# Patient Record
Sex: Male | Born: 1955 | Race: White | Hispanic: No | Marital: Married | Smoking: Former smoker
Health system: Southern US, Community
[De-identification: ages and names within clinical notes are randomized; demographics above are authoritative.]

## PROBLEM LIST (undated history)

## (undated) DIAGNOSIS — K08409 Partial loss of teeth, unspecified cause, unspecified class: Secondary | ICD-10-CM

## (undated) DIAGNOSIS — K219 Gastro-esophageal reflux disease without esophagitis: Secondary | ICD-10-CM

## (undated) DIAGNOSIS — I509 Heart failure, unspecified: Secondary | ICD-10-CM

## (undated) DIAGNOSIS — E785 Hyperlipidemia, unspecified: Secondary | ICD-10-CM

## (undated) DIAGNOSIS — F329 Major depressive disorder, single episode, unspecified: Secondary | ICD-10-CM

## (undated) DIAGNOSIS — M25731 Osteophyte, right wrist: Secondary | ICD-10-CM

## (undated) DIAGNOSIS — E119 Type 2 diabetes mellitus without complications: Secondary | ICD-10-CM

## (undated) DIAGNOSIS — M1990 Unspecified osteoarthritis, unspecified site: Secondary | ICD-10-CM

## (undated) DIAGNOSIS — F32A Depression, unspecified: Secondary | ICD-10-CM

## (undated) DIAGNOSIS — I10 Essential (primary) hypertension: Secondary | ICD-10-CM

## (undated) HISTORY — DX: Partial loss of teeth, unspecified cause, unspecified class: K08.409

## (undated) HISTORY — DX: Major depressive disorder, single episode, unspecified: F32.9

## (undated) HISTORY — DX: Depression, unspecified: F32.A

## (undated) HISTORY — DX: Hyperlipidemia, unspecified: E78.5

## (undated) HISTORY — DX: Type 2 diabetes mellitus without complications: E11.9

## (undated) HISTORY — DX: Essential (primary) hypertension: I10

## (undated) HISTORY — PX: APPENDECTOMY: SHX54

## (undated) HISTORY — DX: Gastro-esophageal reflux disease without esophagitis: K21.9

## (undated) HISTORY — DX: Heart failure, unspecified: I50.9

## (undated) HISTORY — PX: CORONARY ANGIOPLASTY WITH STENT PLACEMENT: SHX49

## (undated) HISTORY — DX: Osteophyte, right wrist: M25.731

---

## 2001-10-26 ENCOUNTER — Encounter: Payer: Self-pay | Admitting: *Deleted

## 2001-10-26 ENCOUNTER — Inpatient Hospital Stay (HOSPITAL_COMMUNITY): Admission: EM | Admit: 2001-10-26 | Discharge: 2001-10-28 | Payer: Self-pay | Admitting: *Deleted

## 2001-10-27 ENCOUNTER — Encounter: Payer: Self-pay | Admitting: *Deleted

## 2004-05-05 ENCOUNTER — Ambulatory Visit: Payer: Self-pay | Admitting: Anesthesiology

## 2004-05-28 ENCOUNTER — Ambulatory Visit: Payer: Self-pay | Admitting: Anesthesiology

## 2004-06-25 ENCOUNTER — Ambulatory Visit: Payer: Self-pay | Admitting: Anesthesiology

## 2004-07-31 ENCOUNTER — Ambulatory Visit: Payer: Self-pay | Admitting: Anesthesiology

## 2004-09-04 ENCOUNTER — Ambulatory Visit: Payer: Self-pay | Admitting: Anesthesiology

## 2004-10-01 ENCOUNTER — Ambulatory Visit: Payer: Self-pay | Admitting: Anesthesiology

## 2004-10-29 ENCOUNTER — Ambulatory Visit: Payer: Self-pay | Admitting: Anesthesiology

## 2004-11-24 ENCOUNTER — Ambulatory Visit: Payer: Self-pay | Admitting: Anesthesiology

## 2004-12-25 ENCOUNTER — Ambulatory Visit: Payer: Self-pay | Admitting: Anesthesiology

## 2005-03-25 ENCOUNTER — Ambulatory Visit: Payer: Self-pay | Admitting: Anesthesiology

## 2005-04-23 ENCOUNTER — Ambulatory Visit: Payer: Self-pay | Admitting: Anesthesiology

## 2005-05-26 ENCOUNTER — Ambulatory Visit: Payer: Self-pay | Admitting: Anesthesiology

## 2005-06-24 ENCOUNTER — Ambulatory Visit: Payer: Self-pay | Admitting: Anesthesiology

## 2005-07-16 ENCOUNTER — Ambulatory Visit: Payer: Self-pay | Admitting: Anesthesiology

## 2005-08-18 ENCOUNTER — Ambulatory Visit: Payer: Self-pay | Admitting: Anesthesiology

## 2005-09-10 ENCOUNTER — Ambulatory Visit: Payer: Self-pay | Admitting: Anesthesiology

## 2005-10-08 ENCOUNTER — Ambulatory Visit: Payer: Self-pay | Admitting: Anesthesiology

## 2005-12-10 ENCOUNTER — Ambulatory Visit: Payer: Self-pay | Admitting: Anesthesiology

## 2005-12-31 ENCOUNTER — Ambulatory Visit: Payer: Self-pay | Admitting: Anesthesiology

## 2006-02-02 ENCOUNTER — Ambulatory Visit: Payer: Self-pay | Admitting: Anesthesiology

## 2006-03-11 ENCOUNTER — Ambulatory Visit: Payer: Self-pay | Admitting: Anesthesiology

## 2006-05-06 ENCOUNTER — Ambulatory Visit: Payer: Self-pay | Admitting: Anesthesiology

## 2006-06-01 ENCOUNTER — Ambulatory Visit: Payer: Self-pay | Admitting: Anesthesiology

## 2006-06-30 ENCOUNTER — Ambulatory Visit: Payer: Self-pay | Admitting: Anesthesiology

## 2006-08-26 ENCOUNTER — Ambulatory Visit: Payer: Self-pay | Admitting: Anesthesiology

## 2006-09-16 ENCOUNTER — Ambulatory Visit: Payer: Self-pay | Admitting: Anesthesiology

## 2006-10-13 ENCOUNTER — Ambulatory Visit: Payer: Self-pay | Admitting: Anesthesiology

## 2006-11-08 ENCOUNTER — Ambulatory Visit: Payer: Self-pay | Admitting: Anesthesiology

## 2006-12-06 ENCOUNTER — Ambulatory Visit: Payer: Self-pay | Admitting: Anesthesiology

## 2007-01-04 ENCOUNTER — Ambulatory Visit: Payer: Self-pay | Admitting: Anesthesiology

## 2007-02-15 ENCOUNTER — Ambulatory Visit: Payer: Self-pay | Admitting: Anesthesiology

## 2007-03-16 ENCOUNTER — Ambulatory Visit: Payer: Self-pay | Admitting: Anesthesiology

## 2007-05-13 ENCOUNTER — Ambulatory Visit: Payer: Self-pay | Admitting: Anesthesiology

## 2007-07-13 ENCOUNTER — Ambulatory Visit: Payer: Self-pay | Admitting: Anesthesiology

## 2007-09-08 ENCOUNTER — Ambulatory Visit: Payer: Self-pay | Admitting: Anesthesiology

## 2007-11-02 ENCOUNTER — Ambulatory Visit: Payer: Self-pay | Admitting: Anesthesiology

## 2007-11-17 ENCOUNTER — Ambulatory Visit: Payer: Self-pay | Admitting: Anesthesiology

## 2007-12-07 ENCOUNTER — Ambulatory Visit: Payer: Self-pay | Admitting: Anesthesiology

## 2008-02-02 ENCOUNTER — Ambulatory Visit: Payer: Self-pay | Admitting: Anesthesiology

## 2008-04-03 ENCOUNTER — Ambulatory Visit: Payer: Self-pay | Admitting: Anesthesiology

## 2008-05-01 ENCOUNTER — Ambulatory Visit: Payer: Self-pay | Admitting: Anesthesiology

## 2008-06-29 ENCOUNTER — Ambulatory Visit: Payer: Self-pay | Admitting: Anesthesiology

## 2008-07-27 HISTORY — PX: CORONARY ANGIOPLASTY: SHX604

## 2008-08-21 ENCOUNTER — Encounter: Payer: Self-pay | Admitting: Endocrinology

## 2008-08-21 ENCOUNTER — Ambulatory Visit: Payer: Self-pay | Admitting: Anesthesiology

## 2008-10-05 ENCOUNTER — Ambulatory Visit: Payer: Self-pay | Admitting: Cardiology

## 2008-10-22 ENCOUNTER — Ambulatory Visit: Payer: Self-pay | Admitting: Anesthesiology

## 2008-11-30 ENCOUNTER — Ambulatory Visit: Payer: Self-pay | Admitting: Anesthesiology

## 2009-01-24 ENCOUNTER — Ambulatory Visit: Payer: Self-pay | Admitting: Anesthesiology

## 2009-03-28 ENCOUNTER — Ambulatory Visit: Payer: Self-pay | Admitting: Anesthesiology

## 2009-06-19 ENCOUNTER — Ambulatory Visit: Payer: Self-pay | Admitting: Anesthesiology

## 2009-07-29 ENCOUNTER — Ambulatory Visit: Payer: Self-pay | Admitting: Anesthesiology

## 2009-09-30 ENCOUNTER — Ambulatory Visit: Payer: Self-pay | Admitting: Anesthesiology

## 2009-12-11 ENCOUNTER — Ambulatory Visit: Payer: Self-pay | Admitting: Anesthesiology

## 2010-03-04 ENCOUNTER — Encounter: Payer: Self-pay | Admitting: Internal Medicine

## 2010-03-04 ENCOUNTER — Encounter: Payer: Self-pay | Admitting: Endocrinology

## 2010-03-04 LAB — CONVERTED CEMR LAB
ALT: 31 units/L
AST: 23 units/L
Albumin: 4.7 g/dL
Alkaline Phosphatase: 64 units/L
BUN: 13 mg/dL
CO2: 30.1 meq/L
Calcium: 9.5 mg/dL
Chloride: 99 meq/L
Creatinine, Ser: 1 mg/dL
GFR calc Af Amer: >60 mL/min
GFR calc non Af Amer: >60 mL/min
Glucose, Bld: 212 mg/dL
HCT: 46.9 %
Hemoglobin: 16.2 g/dL
Hgb A1c MFr Bld: 9 %
Lymphocytes, automated: 0.9 %
MCV: 88.2 fL
Neutrophils Relative %: 4.6 %
PSA: 0.26 ng/mL
Platelets: 197 10*3/uL
Potassium: 4.4 meq/L
RBC: 5.32 M/uL
RDW: 13.4 %
Sodium: 137 meq/L
Total Bilirubin: 0.7 mg/dL
Total Protein: 6.6 g/dL
WBC: 6 10*3/uL

## 2010-03-05 ENCOUNTER — Ambulatory Visit: Payer: Self-pay | Admitting: Anesthesiology

## 2010-03-13 ENCOUNTER — Encounter: Payer: Self-pay | Admitting: Internal Medicine

## 2010-03-26 ENCOUNTER — Ambulatory Visit: Payer: Self-pay | Admitting: Endocrinology

## 2010-03-26 DIAGNOSIS — I2510 Atherosclerotic heart disease of native coronary artery without angina pectoris: Secondary | ICD-10-CM | POA: Insufficient documentation

## 2010-03-26 DIAGNOSIS — F329 Major depressive disorder, single episode, unspecified: Secondary | ICD-10-CM

## 2010-03-26 DIAGNOSIS — F3289 Other specified depressive episodes: Secondary | ICD-10-CM | POA: Insufficient documentation

## 2010-03-26 DIAGNOSIS — E119 Type 2 diabetes mellitus without complications: Secondary | ICD-10-CM | POA: Insufficient documentation

## 2010-03-26 DIAGNOSIS — M109 Gout, unspecified: Secondary | ICD-10-CM | POA: Insufficient documentation

## 2010-04-23 ENCOUNTER — Ambulatory Visit: Payer: Self-pay | Admitting: Endocrinology

## 2010-04-23 LAB — CONVERTED CEMR LAB: Fructosamine: 323 micromoles/L — ABNORMAL HIGH (ref <285)

## 2010-05-12 ENCOUNTER — Ambulatory Visit: Payer: Self-pay | Admitting: Anesthesiology

## 2010-06-18 ENCOUNTER — Telehealth: Payer: Self-pay | Admitting: Endocrinology

## 2010-07-08 ENCOUNTER — Ambulatory Visit: Payer: Self-pay | Admitting: Anesthesiology

## 2010-08-26 NOTE — Progress Notes (Signed)
Summary: onglyza  Phone Note Call from Patient Call back at [facility] Phone [phone]   Caller: Patient Call For: Dr Everardo All Summary of Call: Pt given samples Onglyza 5mg , pt now out of samples and requesting prescription.[facility]. Initial call taken by: Verdell Face,  June 18, 2010 9:12 AM  Follow-up for Phone Call        Rx sent to pharmacy. Pt informed. Follow-up by: Brenton Grills CMA Duncan Dull),  June 18, 2010 9:38 AM    Prescriptions: ONGLYZA 5 MG TABS (SAXAGLIPTIN HCL) 1 tab each am  #30 x 3   Entered by:   Brenton Grills CMA (AAMA)   Authorized by:   Minus Breeding MD   Signed by:   Brenton Grills CMA (AAMA) on 06/18/2010   Method used:   [facility]        [facility]  [place] [facility]retail)       [address]       [place], [place]  [address]       Ph: [phone] or [phone]       Fax: [phone]   RxID:   [id number]

## 2010-08-26 NOTE — Assessment & Plan Note (Signed)
Summary: NEW ENDO CON/BCBS/DM/NWS  #   Vital Signs:  Patient profile:   55 year old male Height:      70 inches (177.80 cm) Weight:      224 pounds (101.82 kg) BMI:     32.26 O2 Sat:      96 % on Room air Temp:     97.1 degrees F (36.17 degrees C) oral Pulse rate:   58 / minute BP sitting:   122 / 88  (left arm) Cuff size:   large  Vitals Entered By: Brenton Grills MA (March 26, 2010 11:08 AM)  O2 Flow:  Room air CC: New Endo/DM/aj Is Patient Diabetic? Yes   Referring Keyonda Bickle:  Jerl Mina MD Primary Makar Slatter:  Jerl Mina MD  CC:  New Endo/DM/aj.  History of Present Illness: pt states 6 years h/o dm.  it is complicated by cad.  he has never been on insulin.  he takes glucovance, and seldom checks cbg's.  he wants to achieve goal a1c without insulin if at all possible pt says his diet and exercise are "not good."   symptomatically, pt states few mos of slight bruising of the forearms, and associated excessive diaphoresis.   Current Medications (verified): 1)  Atenolol 50 Mg Tabs (Atenolol) .Marland Kitchen.. 1 Tablet By Mouth Two Times A Day 2)  Diltiazem Hcl Coated Beads 240 Mg Xr24h-Cap (Diltiazem Hcl Coated Beads) .Marland Kitchen.. 1 By Mouth Once Daily 3)  Lisinopril 20 Mg Tabs (Lisinopril) .Marland Kitchen.. 1 Tablet By Mouth Once Daily 4)  Aspirin 81 Mg Tbec (Aspirin) .Marland Kitchen.. 1 By Mouth Once Daily 5)  Crestor 10 Mg Tabs (Rosuvastatin Calcium) .Marland Kitchen.. 1 Tablet By Mouth Once Daily 6)  Effient 10 Mg Tabs (Prasugrel Hcl) .Marland Kitchen.. 1 By Mouth Once Daily 7)  Glyburide-Metformin 2.5-500 Mg Tabs (Glyburide-Metformin) .... 2 Tablets By Mouth Two Times A Day 8)  Omeprazole Magnesium 20.6 (20 Base) Mg Cpdr (Omeprazole Magnesium) .... As Needed 9)  Glucosamine-Chondroitin  Caps (Glucosamine-Chondroit-Vit C-Mn) .Marland Kitchen.. 1 By Mouth Once Daily  Allergies (verified): No Known Drug Allergies  Past History:  Past Medical History: CAD (ICD-414.00) GOUT (ICD-274.9) DEPRESSION (ICD-311) DM (ICD-250.00) FAMILY HISTORY DIABETES  1ST DEGREE RELATIVE (ICD-V18.0) FAMILY HISTORY OF ALCOHOLISM/ADDICTION (ICD-V61.41)  Past Surgical History: Appendectomy (1999)  Family History: Reviewed history and no changes required. Family History of Alcoholism/Addiction Family History of Arthritis Family History Diabetes (mother, 2 sibs) Family History High cholesterol Family History Hypertension Family History of Heart Disease (Parents) Family History of Stroke  Social History: Reviewed history and no changes required. Occupation: Acupuncturist married Former Smoker Alcohol use-no Occupation:  employed Smoking Status:  quit  Review of Systems       The patient complains of weight loss.         denies blurry vision, headache, chest pain, sob, n/v, cramps, memory loss, hypoglycemia, and rhinorrhea.  he reports urinary frequency.  depression is improved with medication.    Physical Exam  General:  obese.   Head:  head: no deformity eyes: no periorbital swelling, no proptosis external nose and ears are normal mouth: no lesion seen Neck:  Supple without thyroid enlargement or tenderness.  Lungs:  Clear to auscultation bilaterally. Normal respiratory effort.  Heart:  Regular rate and rhythm without murmurs or gallops noted. Normal S1,S2.   Abdomen:  abdomen is soft, nontender.  no hepatosplenomegaly.   not distended.  no hernia  Msk:  muscle bulk and strength are grossly normal.  no obvious joint swelling.  gait is normal  and steady  Pulses:  dorsalis pedis intact bilat.  no carotid bruit  Extremities:  no deformity.  no ulcer on the feet.  feet are of normal color and temp.  trace right pedal edema and trace left pedal edema.   Neurologic:  cn 2-12 grossly intact.   readily moves all 4's.   sensation is intact to touch on the feet  Skin:  normal texture and temp.  no rash.  not diaphoretic  Cervical Nodes:  No significant adenopathy.  Psych:  Alert and cooperative; normal mood and affect; normal  attention span and concentration.   Additional Exam:  outside test results are reviewed:  a1c (03/04/09)=9.0   Impression & Recommendations:  Problem # 1:  DM (ICD-250.00) needs increased rx  Problem # 2:  edema mild due to actos and/or atenolol  Problem # 3:  CAD (ICD-414.00) this increases the risk from any potential hypoglycemia.  Medications Added to Medication List This Visit: 1)  Atenolol 50 Mg Tabs (Atenolol) .Marland Kitchen.. 1 tablet by mouth two times a day 2)  Diltiazem Hcl Coated Beads 240 Mg Xr24h-cap (Diltiazem hcl coated beads) .Marland Kitchen.. 1 by mouth once daily 3)  Lisinopril 20 Mg Tabs (Lisinopril) .Marland Kitchen.. 1 tablet by mouth once daily 4)  Aspirin 81 Mg Tbec (Aspirin) .Marland Kitchen.. 1 by mouth once daily 5)  Crestor 10 Mg Tabs (Rosuvastatin calcium) .Marland Kitchen.. 1 tablet by mouth once daily 6)  Effient 10 Mg Tabs (Prasugrel hcl) .Marland Kitchen.. 1 by mouth once daily 7)  Glyburide-metformin 2.5-500 Mg Tabs (Glyburide-metformin) .... 2 tablets by mouth two times a day 8)  Omeprazole Magnesium 20.6 (20 Base) Mg Cpdr (Omeprazole magnesium) .... As needed 9)  Glucosamine-chondroitin Caps (Glucosamine-chondroit-vit c-mn) .Marland Kitchen.. 1 by mouth once daily 10)  Actos 45 Mg Tabs (Pioglitazone hcl) .Marland Kitchen.. 1 tab once daily 11)  Onglyza 5 Mg Tabs (Saxagliptin hcl) .Marland Kitchen.. 1 tab each am 12)  Onetouch Ultra Test Strp (Glucose blood) .... Once daily, and lencets 250.00  Other Orders: Consultation Level IV ([address])  Patient Instructions: 1)  good diet and exercise habits significanly improve the control of your diabetes.  please let me know if you wish to be referred to a dietician, or weight-loss surgeon.  high blood sugar is very risky to your health.  you should see an eye doctor every year. 2)  controlling your blood pressure and cholesterol drastically reduces the damage diabetes does to your body.  this also applies to quitting smoking.  please discuss these with your doctor.  you should take an aspirin every day, unless you have been  advised by a doctor not to. 3)  we will need to take this complex situation in stages 4)  check your blood sugar 1 time a day.  vary the time of day when you check, between before the 3 meals, and at bedtime.  also check if you have symptoms of your blood sugar being too high or too low.  please keep a record of the readings and bring it to your next appointment here.  please call us sooner if you are having low blood sugar episodes.  5)  for now, increase actos to 45 mg once daily, and add onglyza 5 mg each am.   6)  Please schedule a follow-up appointment in 4-6 weeks. 7)  if blood sugar is persistently over 150, call sooner, so we can add another pill. Prescriptions: ONETOUCH ULTRA TEST  STRP (GLUCOSE BLOOD) once daily, and lencets 250.00  #100 x 3  Entered and Authorized by:   Minus Breeding MD   Signed by:   Minus Breeding MD on 03/26/2010   Method used:   [facility]        [id number]       [address]       [place], [place]  [address]       Ph: [phone] or [phone]       Fax: [phone]   RxID:   [id number]    Preventive Care Screening  Colonoscopy:    Date:  07/28/2007    Results:  normal   Last Tetanus Booster:    Date:  07/27/2006    Results:  Historical

## 2010-08-26 NOTE — Assessment & Plan Note (Signed)
Summary: 4 - 6 WK FU---STC   Vital Signs:  Patient profile:   55 year old male Height:      70 inches (177.80 cm) Weight:      222.25 pounds (101.02 kg) BMI:     32.00 O2 Sat:      96 % on Room air Temp:     97.2 degrees F (36.22 degrees C) oral Pulse rate:   62 / minute BP sitting:   122 / 80  (left arm) Cuff size:   large  Vitals Entered By: Brenton Grills MA (April 23, 2010 11:11 AM)  O2 Flow:  Room air CC: 4-6 week F/U/refill on Lisinopril/aj Is Patient Diabetic? Yes   Referring Provider:  Jerl Mina MD Primary Provider:  Jerl Mina MD  CC:  4-6 week F/U/refill on Lisinopril/aj.  History of Present Illness: pt states he feels well in general.  he says he has not been checking cbg's.  no sxs of hypoglycemia.    Current Medications (verified): 1)  Atenolol 50 Mg Tabs (Atenolol) .Marland Kitchen.. 1 Tablet By Mouth Two Times A Day 2)  Diltiazem Hcl Coated Beads 240 Mg Xr24h-Cap (Diltiazem Hcl Coated Beads) .Marland Kitchen.. 1 By Mouth Once Daily 3)  Lisinopril 20 Mg Tabs (Lisinopril) .Marland Kitchen.. 1 Tablet By Mouth Once Daily 4)  Aspirin 81 Mg Tbec (Aspirin) .Marland Kitchen.. 1 By Mouth Once Daily 5)  Crestor 10 Mg Tabs (Rosuvastatin Calcium) .Marland Kitchen.. 1 Tablet By Mouth Once Daily 6)  Effient 10 Mg Tabs (Prasugrel Hcl) .Marland Kitchen.. 1 By Mouth Once Daily 7)  Glyburide-Metformin 2.5-500 Mg Tabs (Glyburide-Metformin) .... 2 Tablets By Mouth Two Times A Day 8)  Omeprazole Magnesium 20.6 (20 Base) Mg Cpdr (Omeprazole Magnesium) .... As Needed 9)  Glucosamine-Chondroitin  Caps (Glucosamine-Chondroit-Vit C-Mn) .Marland Kitchen.. 1 By Mouth Once Daily 10)  Actos 45 Mg Tabs (Pioglitazone Hcl) .Marland Kitchen.. 1 Tab Once Daily 11)  Onglyza 5 Mg Tabs (Saxagliptin Hcl) .Marland Kitchen.. 1 Tab Each Am 12)  Onetouch Ultra Test  Strp (Glucose Blood) .... Once Daily, and Lencets 250.00  Allergies (verified): No Known Drug Allergies  Past History:  Past Medical History: Last updated: 03/26/2010 CAD (ICD-414.00) GOUT (ICD-274.9) DEPRESSION (ICD-311) DM  (ICD-250.00) FAMILY HISTORY DIABETES 1ST DEGREE RELATIVE (ICD-V18.0) FAMILY HISTORY OF ALCOHOLISM/ADDICTION (ICD-V61.41)  Review of Systems  The patient denies weight loss and weight gain.    Physical Exam  General:  normal appearance.   Extremities:  no edema Additional Exam:  Fructosamine         [H]  323 umol/L    (converts to a1c of 7.1)   Impression & Recommendations:  Problem # 1:  DM (ICD-250.00) Assessment Improved  Other Orders: T-Fructosamine ([address]34-74259) Est. Patient Level III (56387)  Patient Instructions: 1)  check your blood sugar 1 time a day.  vary the time of day when you check, between before the 3 meals, and at bedtime.  also check if you have symptoms of your blood sugar being too high or too low.  please keep a record of the readings and bring it to your next appointment here.  please call us sooner if you are having low blood sugar episodes.  2)  Please schedule a follow-up appointment in 4 months. 3)  blood tests are being ordered for you today.  please call [phone] to hear your test results. 4)  if today's blood test is in a good range, please continue same medication.  if it is high, options are: 5)  1.  add bromocriptine (generic pill) 6)  2.  add welchol (brand-name cholesterol pill) 7)  3.  change onglyza to victoza (a daily injection) 8)  (update: i left message on phone-tree:  same meds for now.

## 2010-08-26 NOTE — Letter (Signed)
Summary: [facility]   Imported By: Sherian Rein 04/01/2010 13:28:37  _____________________________________________________________________  External Attachment:    Type:   Image     Comment:   External Document

## 2010-09-05 ENCOUNTER — Ambulatory Visit: Payer: Self-pay | Admitting: Anesthesiology

## 2010-10-29 ENCOUNTER — Ambulatory Visit: Payer: Self-pay | Admitting: Anesthesiology

## 2010-11-17 ENCOUNTER — Ambulatory Visit: Payer: Self-pay | Admitting: Anesthesiology

## 2010-11-19 ENCOUNTER — Other Ambulatory Visit: Payer: Self-pay | Admitting: Endocrinology

## 2010-12-12 NOTE — Cardiovascular Report (Signed)
[place]. [facility]  Patient:    Peter Lamb, Peter Lamb Visit Number: [id number] MRN: [id number]          Service Type: MED Location: [id number] Attending Physician:  Netta Cedars Dictated by:   Delrae Rend, M.D. Proc. Date: 10/27/01 Admit Date:  10/26/2001 Discharge Date: 10/28/2001   CC:         Dr. Omar Person, [place], [facility]  Dr. Sharol Roussel, Cardiology, [place], [facility] [address] [place]   Cardiac Catheterization  PROCEDURE: 1. Selective left ventriculography. 2. Selective left and right coronary arteriography. 3. Cutting balloon angioplasty of the obtuse marginal branch of the    circumflex coronary artery.  CARDIOLOGIST:  Delrae Rend, M.D.  INDICATION:  Mr. Peter Lamb is a 55 year old gentleman with history of hypertension, diabetes, hypercholesterolemia, previous coronary artery PTCA and stenting x 3 in 1996.  He presents to the emergency room with chest pain suggestive of unstable angina.  Of note, he has also been having symptoms very typical for chronic stable angina pectoris.  Due to his multiple cardiac risk factors and symptoms which are very typical, he was brought to the cardiac catheterization lab to evaluate his coronary anatomy.  HEMODYNAMIC DATA:  Left ventricle pressures were 113/6 with an end-diastolic pressure of 14 mmHg. The central aortic pressures were 113/87 with a mean of 101 mmHg.  There was no pressure gradient across the aortic valve.  ANGIOGRAPHIC DATA:  Left ventricle: The left ventricular systolic function is well preserved and is estimated at 60%.  There is no significant mitral regurgitation.  CORONARY ANGIOGRAPHIC DATA:  Right coronary artery: The right coronary artery is a moderate caliber vessel and is nondominant.  It has mild disease.  Left main coronary artery: The left main coronary artery is a large caliber vessel.   It bifurcates into a dominant circumflex and a large left anterior descending artery.  Circumflex coronary artery: The circumflex coronary artery is a dominant vessel and gives origin to several PDA branches distally.   It gives origin to a moderate to large size obtuse marginal #1.  The obtuse marginal #1 has a secondary branch which is almost equivalent to an optional obtuse marginal. The secondary branch has proximal 85% stenosis.  The stent in the mid to distal part of the circumflex coronary artery and also the stent in the obtuse marginal branch of the circumflex coronary artery which is distal to the secondary branch is widely patent.  Left anterior descending artery: The left anterior descending artery is a large caliber vessel.  There appears to be a stent in the proximal and mid segment, and there is a moderate size diagonal #1 which comes off of the stented segment.  The stent is widely patent.  There is mild disease in the left anterior descending artery.  INTERVENTIONAL DATA:  Successful cutting balloon angioplasty with a 2.5 x 6 mm cutting balloon.  Multiple cuts were made in the second branch of the obtuse marginal #1.   The stenosis was reduced from 85% to 0%.  There was TIMI-3 flow noted with no evidence of dissection and no evidence of thrombus at the end of the procedure.  The patient tolerated the procedure well.  TECHNIQUE OF PROCEDURE:  Under the the usual sterile precautions using 6-French right femoral access, a 6-French multipurpose B2 catheter was advanced into the ascending aorta over the 0.035 mm J wire.  The catheter was gently advanced into the left  ventricle and left ventricle pressures were monitored.  Hand contrast injection of left ventricle was performed both in the RAO and LAO projections.  The catheter was flushed with saline, pulled back into the ascending aorta, and pressure gradient across the aortic valve was monitored.  The right coronary  artery was selectively engaged, and arteriography was performed.   Then the catheter was pulled out of the body and exchanged for a 6-French diagnostic Judkins left catheter.  Left main coronary artery was selectively engaged, and arteriography was performed.  TECHNIQUE OF CORONARY INTERVENTION:  The 6-French right femoral access sheath was exchanged for a 8-French sheath.  The 8-French 4 guide was advanced in the ascending aorta over a 0.035 mm J wire.  The left main coronary artery was selectively engaged.  Then a 190 cm  x 0.014 inch Luge wire was advanced into the circumflex coronary artery, and the wire was carefully positioned in the distal segment of the secondary branch of the obtuse marginal #1.  After confirming the wire position with angiogram, a 2.5 x 6 mm cutting balloon was advanced into the obtuse marginal artery.  After confirming the position of the cutting balloon, three cutting balloon angioplasty performed at maximum of 7 atmospheres pressure for up to 2 minutes.  Then the balloon was deflated, pulled back in the guiding catheter, and arteriography was performed. Intracoronary nitroglycerin was also administered.  Excellent results were noted.  Then the guidewire and balloon were pulled out of the body, and arteriography was performed.  During the procedure, intravenous heparin was administered, and ACT was maintained at therapeutic range at 301 seconds at the end of the procedure. The patient tolerated the procedure well.  The arterial access sheath was sutured in place, and the patient was transferred to the floor in stable condition. Dictated by:   Delrae Rend, M.D. Attending Physician:  Netta Cedars DD:  10/27/01 TD:  10/28/01 Job: [id number]

## 2010-12-12 NOTE — Discharge Summary (Signed)
[place]. [facility]  Patient:    Peter Lamb, Peter Lamb Visit Number: [id number] MRN: [id number]          Service Type: MED Location: [id number] Attending Physician:  Netta Cedars Dictated by:   Abelino Derrick, P.A.C. Admit Date:  10/26/2001 Discharge Date: 10/28/2001   CC:         Dr. Ilda Basset, M.D.  Dr. Darrold Junker   Discharge Summary  DISCHARGE DIAGNOSES: 1. Unstable angina, obtuse marginal branch cutting balloon this admission. 2. Prior circumflex and left anterior descending stenting, patent at    catheterization this admission. 3. Hypertension. 4. Treated hyperlipidemia. 5. Non-insulin-dependent diabetes.  HISTORY OF PRESENT ILLNESS:  The patient is a 55 year old male who had stent in 1996 at [facility].  He has hypertension, diabetes, and hyperlipidemia.  He was admitted on October 26, 2001, with chest pain consistent with unstable angina.  CK-MBs and troponins were negative.  HOSPITAL COURSE:  He was admitted to telemetry, started on heparin, and set up for catheterization.  This was done on October 27, 2001, by Delrae Rend, M.D. This revealed a patent RCA, patent prior circumflex site, patent OM site, and patent LAD site.  He did have a branch narrowing of 85% that was dilated with a cutting balloon.  The plan is for medical therapy and Plavix for four weeks. No Integrilin was used.  We feel that he can be discharged on October 28, 2001.  DISCHARGE MEDICATIONS:  The patient is to hold his GlucoVance for two days and then resume at 2.5/500.  He is to go home on enalapril 20 mg a day, Lipitor 10 mg a day, Tiazac 240 mg q.d., atenolol 50 mg a day, Prilosec 20 mg a day, nitroglycerin sublingual p.r.n., Flexeril 10 mg a day, and Plavix once a day for four weeks.  LABORATORY DATA:  White count 6.3, hemoglobin 16.8, hematocrit 46.9, platelets 203.  CK-MB and troponin were negative.  The INR was 1.0. Sodium 136,  potassium 4.3, BUN 18, creatinine 1.3.  A lipid profile was obtained and the results are pending at the time of dictation.  The chest x-ray showed no acute disease.  DISPOSITION:  The patient is discharged in stable condition.  FOLLOW-UP:  Will follow up with Dr. Darrold Junker and Dr. Ronna Polio. Dictated by:   Abelino Derrick, P.A.C. Attending Physician:  Netta Cedars DD:  10/28/01 TD:  10/29/01 Job: [id number]

## 2010-12-15 ENCOUNTER — Ambulatory Visit: Payer: BC Managed Care – PPO

## 2010-12-31 ENCOUNTER — Ambulatory Visit: Payer: Self-pay | Admitting: Anesthesiology

## 2011-03-03 ENCOUNTER — Ambulatory Visit: Payer: Self-pay | Admitting: Anesthesiology

## 2011-04-06 ENCOUNTER — Ambulatory Visit: Payer: Self-pay | Admitting: Anesthesiology

## 2011-05-13 DIAGNOSIS — I10 Essential (primary) hypertension: Secondary | ICD-10-CM | POA: Insufficient documentation

## 2011-05-13 DIAGNOSIS — E119 Type 2 diabetes mellitus without complications: Secondary | ICD-10-CM | POA: Insufficient documentation

## 2011-05-13 DIAGNOSIS — E785 Hyperlipidemia, unspecified: Secondary | ICD-10-CM | POA: Insufficient documentation

## 2011-05-13 DIAGNOSIS — I2510 Atherosclerotic heart disease of native coronary artery without angina pectoris: Secondary | ICD-10-CM | POA: Insufficient documentation

## 2011-05-16 ENCOUNTER — Other Ambulatory Visit: Payer: Self-pay | Admitting: Endocrinology

## 2011-06-03 ENCOUNTER — Ambulatory Visit: Payer: Self-pay | Admitting: Anesthesiology

## 2011-07-10 ENCOUNTER — Ambulatory Visit: Payer: Self-pay | Admitting: Anesthesiology

## 2011-07-30 ENCOUNTER — Ambulatory Visit: Payer: Self-pay | Admitting: Anesthesiology

## 2011-10-01 ENCOUNTER — Ambulatory Visit: Payer: Self-pay | Admitting: Anesthesiology

## 2011-10-12 DIAGNOSIS — Z0271 Encounter for disability determination: Secondary | ICD-10-CM

## 2011-11-19 ENCOUNTER — Ambulatory Visit: Payer: Self-pay | Admitting: Anesthesiology

## 2012-01-20 ENCOUNTER — Ambulatory Visit: Payer: Self-pay | Admitting: Anesthesiology

## 2012-03-21 ENCOUNTER — Ambulatory Visit: Payer: Self-pay | Admitting: Anesthesiology

## 2012-06-21 ENCOUNTER — Ambulatory Visit: Payer: Self-pay | Admitting: Anesthesiology

## 2012-08-18 ENCOUNTER — Ambulatory Visit: Payer: Self-pay | Admitting: Anesthesiology

## 2012-08-31 ENCOUNTER — Ambulatory Visit: Payer: Self-pay | Admitting: Anesthesiology

## 2012-09-22 ENCOUNTER — Ambulatory Visit: Payer: Self-pay | Admitting: Anesthesiology

## 2012-10-11 ENCOUNTER — Ambulatory Visit: Payer: Self-pay | Admitting: Anesthesiology

## 2012-10-18 ENCOUNTER — Ambulatory Visit: Payer: Self-pay | Admitting: Anesthesiology

## 2012-11-07 ENCOUNTER — Ambulatory Visit: Payer: Self-pay | Admitting: Anesthesiology

## 2013-01-03 ENCOUNTER — Ambulatory Visit: Payer: Self-pay | Admitting: Anesthesiology

## 2013-04-03 ENCOUNTER — Ambulatory Visit: Payer: Self-pay | Admitting: Anesthesiology

## 2013-05-31 ENCOUNTER — Ambulatory Visit: Payer: Self-pay | Admitting: Anesthesiology

## 2013-08-31 ENCOUNTER — Ambulatory Visit: Payer: Self-pay | Admitting: Anesthesiology

## 2013-10-26 ENCOUNTER — Ambulatory Visit: Payer: Self-pay | Admitting: Anesthesiology

## 2013-12-27 ENCOUNTER — Ambulatory Visit: Payer: Self-pay | Admitting: Anesthesiology

## 2014-01-30 ENCOUNTER — Ambulatory Visit: Payer: Self-pay | Admitting: Anesthesiology

## 2014-06-19 ENCOUNTER — Ambulatory Visit: Payer: Self-pay | Admitting: Anesthesiology

## 2014-09-19 ENCOUNTER — Ambulatory Visit: Payer: Self-pay | Admitting: Anesthesiology

## 2014-11-21 ENCOUNTER — Ambulatory Visit
Admit: 2014-11-21 | Disposition: A | Discharge status: Home / Self Care | Payer: Self-pay | Attending: Anesthesiology | Admitting: Anesthesiology

## 2015-02-07 ENCOUNTER — Telehealth: Payer: Self-pay | Admitting: Anesthesiology

## 2015-02-07 ENCOUNTER — Telehealth: Payer: Self-pay | Admitting: *Deleted

## 2015-02-07 NOTE — Telephone Encounter (Signed)
Patient is out of flexeril. Instructed that Dr. Andree Elk is out of office until 02-11-15. I will try to reach him by cell phone for permission to refill.

## 2015-02-07 NOTE — Telephone Encounter (Signed)
Has MR apt on 7/20 , pt stats he is already out of meds and would like to get his muscle relaxer called in

## 2015-02-13 ENCOUNTER — Ambulatory Visit: Payer: Medicare Other | Attending: Anesthesiology | Admitting: Anesthesiology

## 2015-02-13 ENCOUNTER — Encounter: Payer: Self-pay | Admitting: Anesthesiology

## 2015-02-13 VITALS — BP 135/74 | HR 62 | Temp 97.5°F | Resp 16 | Ht 70.0 in | Wt 240.0 lb

## 2015-02-13 DIAGNOSIS — M5136 Other intervertebral disc degeneration, lumbar region: Secondary | ICD-10-CM

## 2015-02-13 DIAGNOSIS — M5416 Radiculopathy, lumbar region: Secondary | ICD-10-CM | POA: Diagnosis not present

## 2015-02-13 DIAGNOSIS — M47816 Spondylosis without myelopathy or radiculopathy, lumbar region: Secondary | ICD-10-CM

## 2015-02-13 DIAGNOSIS — M1288 Other specific arthropathies, not elsewhere classified, other specified site: Secondary | ICD-10-CM | POA: Insufficient documentation

## 2015-02-13 DIAGNOSIS — M545 Low back pain: Secondary | ICD-10-CM | POA: Diagnosis present

## 2015-02-13 DIAGNOSIS — F1190 Opioid use, unspecified, uncomplicated: Secondary | ICD-10-CM | POA: Diagnosis not present

## 2015-02-13 MED ORDER — LIDOCAINE HCL (PF) 1 % IJ SOLN
INTRAMUSCULAR | Status: AC
  Administered 2015-02-13: 15:00:00
  Filled 2015-02-13: qty 5

## 2015-02-13 MED ORDER — SODIUM CHLORIDE 0.9 % IJ SOLN
INTRAMUSCULAR | Status: AC
  Administered 2015-02-13: 15:00:00
  Filled 2015-02-13: qty 10

## 2015-02-13 MED ORDER — OXYCODONE HCL ER 60 MG PO T12A: 1.0000 | EXTENDED_RELEASE_TABLET | Freq: Three times a day (TID) | ORAL | Status: DC

## 2015-02-13 MED ORDER — MIDAZOLAM HCL 5 MG/5ML IJ SOLN
INTRAMUSCULAR | Status: AC
  Administered 2015-02-13: 2 mg via INTRAVENOUS
  Filled 2015-02-13: qty 5

## 2015-02-13 MED ORDER — TRIAMCINOLONE ACETONIDE 40 MG/ML IJ SUSP
INTRAMUSCULAR | Status: AC
  Administered 2015-02-13: 15:00:00
  Filled 2015-02-13: qty 1

## 2015-02-13 MED ORDER — IOPAMIDOL (ISOVUE-M 200) INJECTION 41%
INTRAMUSCULAR | Status: AC
  Administered 2015-02-13: 15:00:00
  Filled 2015-02-13: qty 10

## 2015-02-13 MED ORDER — ROPIVACAINE HCL 2 MG/ML IJ SOLN
INTRAMUSCULAR | Status: AC
  Filled 2015-02-13: qty 10

## 2015-02-13 NOTE — Progress Notes (Signed)
Safety precautions to be maintained throughout the outpatient stay will include: orient to surroundings, keep bed in low position, maintain call bell within reach at all times, provide assistance with transfer out of bed and ambulation.  

## 2015-02-13 NOTE — Patient Instructions (Signed)

## 2015-02-13 NOTE — Progress Notes (Signed)
Script in hand for oxycodone x 3Epidural Steroid Injection Patient Information  Description: The epidural space surrounds the nerves as they exit the spinal cord.  In some patients, the nerves can be compressed and inflamed by a bulging disc or a tight spinal canal (spinal stenosis).  By injecting steroids into the epidural space, we can bring irritated nerves into direct contact with a potentially helpful medication.  These steroids act directly on the irritated nerves and can reduce swelling and inflammation which often leads to decreased pain.  Epidural steroids may be injected anywhere along the spine and from the neck to the low back depending upon the location of your pain.   After numbing the skin with local anesthetic (like Novocaine), a small needle is passed into the epidural space slowly.  You may experience a sensation of pressure while this is being done.  The entire block usually last less than 10 minutes.  Conditions which may be treated by epidural steroids:   Low back and leg pain  Neck and arm pain  Spinal stenosis  Post-laminectomy syndrome  Herpes zoster (shingles) pain  Pain from compression fractures  Preparation for the injection:  1. Do not eat any solid food or dairy products within 6 hours of your appointment.  2. You may drink clear liquids up to 2 hours before appointment.  Clear liquids include water, black coffee, juice or soda.  No milk or cream please. 3. You may take your regular medication, including pain medications, with a sip of water before your appointment  Diabetics should hold regular insulin (if taken separately) and take 1/2 normal NPH dos the morning of the procedure.  Carry some sugar containing items with you to your appointment. 4. A driver must accompany you and be prepared to drive you home after your procedure.  5. Bring all your current medications with your. 6. An IV may be inserted and sedation may be given at the discretion of the  physician.   7. A blood pressure cuff, EKG and other monitors will often be applied during the procedure.  Some patients may need to have extra oxygen administered for a short period. 8. You will be asked to provide medical information, including your allergies, prior to the procedure.  We must know immediately if you are taking blood thinners (like Coumadin/Warfarin)  Or if you are allergic to IV iodine contrast (dye). We must know if you could possible be pregnant.  Possible side-effects:  Bleeding from needle site  Infection (rare, may require surgery)  Nerve injury (rare)  Numbness & tingling (temporary)  Difficulty urinating (rare, temporary)  Spinal headache ( a headache worse with upright posture)  Light -headedness (temporary)  Pain at injection site (several days)  Decreased blood pressure (temporary)  Weakness in arm/leg (temporary)  Pressure sensation in back/neck (temporary)  Call if you experience:  Fever/chills associated with headache or increased back/neck pain.  Headache worsened by an upright position.  New onset weakness or numbness of an extremity below the injection site  Hives or difficulty breathing (go to the emergency room)  Inflammation or drainage at the infection site  Severe back/neck pain  Any new symptoms which are concerning to you  Please note:  Although the local anesthetic injected can often make your back or neck feel good for several hours after the injection, the pain will likely return.  It takes 3-7 days for steroids to work in the epidural space.  You may not notice any pain relief for  at least that one week.  If effective, we will often do a series of three injections spaced 3-6 weeks apart to maximally decrease your pain.  After the initial series, we generally will wait several months before considering a repeat injection of the same type.  If you have any questions, please call [phone] [facility]

## 2015-02-14 NOTE — Telephone Encounter (Signed)
No problems 

## 2015-02-14 NOTE — Progress Notes (Signed)
PROCEDURE PERFORMED: L5-S1 lumbar epidural steroid injection under fluroscopic guidance with moderate sedation.  CC:  Left side lower back pain with persistent left side posterior lateral leg pain  HPI:  He presents for reevaluation. He was last seen a few months ago and continues to have pain in his low back with radiation into the left posterior lateral leg. He reports that he did well with his last injection yielding a 50-75% improvement lasting several weeks and overall his pain is better at this time. He desires to proceed with a repeat injection today. He feels he is making good progress with the injections and his medication management. Otherwise, he denies any change in lower extremity strength or function or problems with bowel bladder function.  Physical Exam:    PERRL, EOMI  Heart RRR   LCTA  Musculoskeletal: He continues to have pain in the left lumbar region with radiation running down the left posterior lateral leg. He does have a positive straight leg raise on the left side. His strength appears to be a baseline without other changes noted.  Assessment: Lumbar degenerative disc disease with left side L5 radiculitis   2. Facet arthropathy   3. chronic opiate management      PLAN:   1. We will proceed with a lumbar epidural steroids injection today. Medications: We will continue presently prescribed medications with refills given today  2.  The patient is to return for reevaluation in approximately 3 months. He has been instructed to continue follow-up with their primary care physician regarding their baseline medical care.    Procedure:  LESI:  NOTE: The risks, benefits, and expectations of the procedure have been discussed and explained to the patient who was understanding and in agreement with suggested treatment plan. No guarantees were made.  DESCRIPTION OF PROCEDURE: Lumbar epidural steroid injection with IV Versed, EKG, blood pressure, pulse, and pulse oximetry  monitoring. The procedure was performed with the patient in the prone position under fluoroscopic guidance. A local anesthetic skin wheal of 1.5% plain lidocaine was performed at the appropriate site after fluoroscopic identifictation  Using strict aseptic technique, I then advanced an 18-gauge Tuohy epidural needle in the midline via loss-of-resistance  Technique. There was negative aspiration for negative aspiration for heme or  CSF.  I then confirmed position with both AP and Lateral fluoroscan. 2 cc of Isovue were injected to confirm needle placement  A total of 5 mL of Preservative-Free normal saline with 40 mg of Kenalog and 1cc Ropicaine 0.2 percent was injected incrementally via the  epidurally placed needle. Needle removed. The patient tolerated the injection well.   @Renesmae Donahey  Andree Elk, MD@

## 2015-02-26 ENCOUNTER — Other Ambulatory Visit: Payer: Self-pay | Admitting: Anesthesiology

## 2015-04-15 ENCOUNTER — Other Ambulatory Visit: Payer: Self-pay | Admitting: Anesthesiology

## 2015-04-16 NOTE — Telephone Encounter (Signed)
Patient called at 9:00 to get refill on methacarbomal and sched appt for 05-08-15

## 2015-04-17 ENCOUNTER — Telehealth: Payer: Self-pay | Admitting: Anesthesiology

## 2015-04-17 NOTE — Telephone Encounter (Signed)
Pt needs a refill on methocarbamol

## 2015-05-08 ENCOUNTER — Ambulatory Visit: Payer: Medicare Other | Attending: Anesthesiology | Admitting: Anesthesiology

## 2015-05-08 VITALS — BP 151/84 | HR 51 | Temp 97.5°F | Resp 16 | Ht 70.0 in | Wt 234.0 lb

## 2015-05-08 DIAGNOSIS — M1288 Other specific arthropathies, not elsewhere classified, other specified site: Secondary | ICD-10-CM | POA: Diagnosis not present

## 2015-05-08 DIAGNOSIS — M545 Low back pain: Secondary | ICD-10-CM | POA: Diagnosis present

## 2015-05-08 DIAGNOSIS — F1190 Opioid use, unspecified, uncomplicated: Secondary | ICD-10-CM | POA: Insufficient documentation

## 2015-05-08 DIAGNOSIS — M5416 Radiculopathy, lumbar region: Secondary | ICD-10-CM | POA: Insufficient documentation

## 2015-05-08 DIAGNOSIS — M47816 Spondylosis without myelopathy or radiculopathy, lumbar region: Secondary | ICD-10-CM

## 2015-05-08 DIAGNOSIS — M5136 Other intervertebral disc degeneration, lumbar region: Secondary | ICD-10-CM

## 2015-05-08 DIAGNOSIS — M51369 Other intervertebral disc degeneration, lumbar region without mention of lumbar back pain or lower extremity pain: Secondary | ICD-10-CM

## 2015-05-08 MED ORDER — OXYCODONE HCL ER 60 MG PO T12A: 1.0000 | EXTENDED_RELEASE_TABLET | Freq: Three times a day (TID) | ORAL | Status: DC

## 2015-05-08 MED ORDER — METHOCARBAMOL 750 MG PO TABS: 750.0000 mg | ORAL_TABLET | Freq: Two times a day (BID) | ORAL | Status: DC

## 2015-05-08 NOTE — Progress Notes (Signed)
Safety precautions to be maintained throughout the outpatient stay will include: orient to surroundings, keep bed in low position, maintain call bell within reach at all times, provide assistance with transfer out of bed and ambulation.  

## 2015-05-08 NOTE — Patient Instructions (Signed)
Return in 2 months.  A prescription for OXYCODONE X 2  was given to you today. A prescription for ROBAXIN was sent to your pharmacy and should be available for pickup today.

## 2015-05-08 NOTE — Progress Notes (Signed)
Discharged ambulatory at 3 pm

## 2015-05-09 NOTE — Progress Notes (Signed)
  CC:  Left side lower back pain with persistent left side posterior lateral leg pain  HPI:  He presents for reevaluation last seen a few months ago. He's been doing well since his last examination and is taking his medications as prescribed. No new significant changes found in his lower extremity strength or function in his low back pain has been of the same quality and characteristic as previously documented. Otherwise his usual state of health at this time. Physical Exam:    PERRL, EOMI  Heart RRR   LCTA  Musculoskeletal: He continues to have pain in the left lumbar region with radiation running down the left posterior lateral leg. He does have a positive straight leg raise on the left side. His strength appears to be a baseline without other changes noted.  Assessment: Lumbar degenerative disc disease with left side L5 radiculitis   2. Facet arthropathy   3. chronic opiate management      PLAN:   1. We'll defer on any repeat injections at this time and have him return to clinic approximately 2 months for reevaluation and refill of his medications today for the next 2 months. We'll be refill in both feet Robaxin and his OxyContin.  2.   He has been instructed to continue follow-up with their primary care physician regarding their baseline medical care.    @James  Andree Elk, MD@

## 2015-06-03 ENCOUNTER — Other Ambulatory Visit: Payer: Self-pay | Admitting: Anesthesiology

## 2015-07-02 ENCOUNTER — Encounter: Payer: Self-pay | Admitting: Anesthesiology

## 2015-07-02 ENCOUNTER — Ambulatory Visit: Payer: Medicare Other | Attending: Anesthesiology | Admitting: Anesthesiology

## 2015-07-02 ENCOUNTER — Encounter: Payer: Medicare Other | Admitting: Anesthesiology

## 2015-07-02 VITALS — BP 115/64 | HR 55 | Temp 98.8°F | Resp 18 | Ht 70.0 in | Wt 234.0 lb

## 2015-07-02 DIAGNOSIS — M47816 Spondylosis without myelopathy or radiculopathy, lumbar region: Secondary | ICD-10-CM | POA: Diagnosis not present

## 2015-07-02 DIAGNOSIS — M5136 Other intervertebral disc degeneration, lumbar region: Secondary | ICD-10-CM | POA: Diagnosis not present

## 2015-07-02 MED ORDER — OXYCODONE HCL ER 60 MG PO T12A: 1.0000 | EXTENDED_RELEASE_TABLET | Freq: Three times a day (TID) | ORAL | Status: DC

## 2015-07-04 NOTE — Progress Notes (Signed)
  CC:  Left side lower back pain with persistent left side posterior lateral leg pain  HPI:  Peter Lamb presents for reevaluation today. He was last seen in October and continues to do well with his current regimen. His back pain as stated under stable control without significant change in his lower extremity strength and function. Bowel and bladder function have been stable and he's taking his medications as prescribed. No problems are noted and I reviewed his narcotic assessment sheet and he seems to be doing well with this.   Physical Exam:    PERRL, EOMI  Heart RRR   LCTA  Musculoskeletal: He continues to have pain in the left lumbar region with radiation running down the left posterior lateral leg. Some mild paraspinous muscle tenderness but no overt trigger points. His gait remains antalgic  Assessment: Lumbar degenerative disc disease with left side L5 radiculitis   2. Facet arthropathy   3. chronic opiate management      PLAN:   1. I will refill his medications for the next 2 months with return to clinic in 2 months. No further injections are warranted at this time.  2.   He has been instructed to continue follow-up with their primary care physician regarding their baseline medical care.    @James  Andree Elk, MD@

## 2015-08-19 ENCOUNTER — Telehealth: Payer: Self-pay | Admitting: Anesthesiology

## 2015-08-19 NOTE — Telephone Encounter (Signed)
patient only filled 1/2 of script(verified at pharmacy per this nurse) . Instructed patient that Dr. Andree Elk would rewrite script for him to pick up on 08-28-2015 for a 30 day supply since his insurance would restart coverage at that time. Patient instructed to pick up script on 08-28-2015.

## 2015-08-19 NOTE — Telephone Encounter (Signed)
His ins lapsed for Jan, he could only get 15 days of meds filled, pharmacy says he need another script for the next 15 days on 08-28-2015 can he pick this up on Monday ?

## 2015-08-28 ENCOUNTER — Telehealth: Payer: Self-pay | Admitting: *Deleted

## 2015-08-28 NOTE — Telephone Encounter (Signed)
pateint's wife here to pick up script. Dr. Dossie Arbour signed in Dr. Andree Elk absence.

## 2015-09-05 ENCOUNTER — Encounter: Payer: Self-pay | Admitting: Anesthesiology

## 2015-09-05 ENCOUNTER — Other Ambulatory Visit: Payer: Self-pay | Admitting: Anesthesiology

## 2015-09-05 ENCOUNTER — Ambulatory Visit: Payer: Medicare Other | Attending: Anesthesiology | Admitting: Anesthesiology

## 2015-09-05 VITALS — BP 133/76 | HR 55 | Temp 98.3°F | Resp 15 | Ht 70.0 in | Wt 240.0 lb

## 2015-09-05 DIAGNOSIS — M1288 Other specific arthropathies, not elsewhere classified, other specified site: Secondary | ICD-10-CM | POA: Insufficient documentation

## 2015-09-05 DIAGNOSIS — M545 Low back pain: Secondary | ICD-10-CM | POA: Diagnosis present

## 2015-09-05 DIAGNOSIS — M5116 Intervertebral disc disorders with radiculopathy, lumbar region: Secondary | ICD-10-CM | POA: Diagnosis not present

## 2015-09-05 DIAGNOSIS — M79606 Pain in leg, unspecified: Secondary | ICD-10-CM | POA: Diagnosis present

## 2015-09-05 DIAGNOSIS — M5136 Other intervertebral disc degeneration, lumbar region: Secondary | ICD-10-CM

## 2015-09-05 DIAGNOSIS — M47816 Spondylosis without myelopathy or radiculopathy, lumbar region: Secondary | ICD-10-CM

## 2015-09-05 DIAGNOSIS — F1190 Opioid use, unspecified, uncomplicated: Secondary | ICD-10-CM | POA: Diagnosis not present

## 2015-09-05 MED ORDER — OXYCODONE HCL ER 60 MG PO T12A: 1.0000 | EXTENDED_RELEASE_TABLET | Freq: Three times a day (TID) | ORAL | Status: DC

## 2015-09-05 NOTE — Progress Notes (Signed)
Safety precautions to be maintained throughout the outpatient stay will include: orient to surroundings, keep bed in low position, maintain call bell within reach at all times, provide assistance with transfer out of bed and ambulation.  

## 2015-09-06 NOTE — Progress Notes (Signed)
  CC:  Left side lower back pain with persistent left side posterior lateral leg pain  HPI:  Peter Lamb was seen 2 months ago and states that he's doing well. No significant change in the quality characteristic or distribution of the symptoms is noted. Taking his medications as prescribed and these continue to work well for him with no significant side effects on his narcotic assessment sheet.  Physical Exam:    PERRL, EOMI  Heart RRR   LCTA  Musculoskeletal: He continues to have pain in the left lumbar region with radiation running down the left posterior lateral leg. Some mild paraspinous muscle tenderness but no overt trigger points. His gait remains antalgic  Assessment: Lumbar degenerative disc disease with left side L5 radiculitis   2. Facet arthropathy   3. chronic opiate management      PLAN:   1. I will refill his medications for the next 2 months with return to clinic in 2 months. No further injections are warranted at this time.  2.   He has been instructed to continue follow-up with their primary care physician regarding their baseline medical care.    @Aydyn Testerman  Andree Elk, MD@

## 2015-09-12 LAB — TOXASSURE SELECT 13 (MW), URINE: PDF: 0

## 2015-10-01 ENCOUNTER — Telehealth: Payer: Self-pay | Admitting: Anesthesiology

## 2015-10-01 NOTE — Telephone Encounter (Signed)
Ins will not longer pay for methacarbamal / can you call something different in for him

## 2015-10-02 NOTE — Telephone Encounter (Signed)
Pharmacy states that ins co will cover Zanaflex. Will speak with Dr. Andree Elk next time he was in the clinic.

## 2015-10-07 ENCOUNTER — Telehealth: Payer: Self-pay

## 2015-10-07 NOTE — Telephone Encounter (Signed)
Pt insurance will not cover the muscle relaxer that are normally prescribed to him pt pharmacy told him Tizanidine is the same as the muscle relaxer he has been taking and pt would like Dr. Andree Elk to write a prescription for this muscle relaxer

## 2015-10-16 ENCOUNTER — Other Ambulatory Visit: Payer: Self-pay | Admitting: *Deleted

## 2015-10-16 MED ORDER — TIZANIDINE HCL 4 MG PO TABS: 4.0000 mg | ORAL_TABLET | Freq: Every day | ORAL | Status: DC

## 2015-10-16 NOTE — Telephone Encounter (Signed)
Dr. Andree Elk agreed to write Tizanidine. Patient notified, instructed on how to take it.

## 2015-11-15 ENCOUNTER — Telehealth: Payer: Self-pay | Admitting: *Deleted

## 2015-11-15 ENCOUNTER — Telehealth: Payer: Self-pay | Admitting: Anesthesiology

## 2015-11-15 ENCOUNTER — Other Ambulatory Visit: Payer: Self-pay | Admitting: *Deleted

## 2015-11-15 MED ORDER — OXYCODONE HCL ER 60 MG PO T12A: 1.0000 | EXTENDED_RELEASE_TABLET | Freq: Three times a day (TID) | ORAL | Status: DC

## 2015-11-15 NOTE — Telephone Encounter (Signed)
Called patient to let him know that his Rx for oxycodone 60 mg was ready to be picked up with a do not fill until date of 11/26/15

## 2015-11-15 NOTE — Telephone Encounter (Signed)
Patient needs med refill by 11-26-15 no appts available until 12-19-15, can he pick up script for this refill and keep appt to come in for next ? Please call patient to advise

## 2015-11-15 NOTE — Telephone Encounter (Signed)
Left voicemail that we would speak with Dr Andree Elk next week when he is in the office and let him know what we need to do in terms of scheduling him early and getting his medication for him.

## 2015-11-28 ENCOUNTER — Other Ambulatory Visit: Payer: Self-pay | Admitting: Anesthesiology

## 2015-12-12 ENCOUNTER — Other Ambulatory Visit: Payer: Self-pay | Admitting: Anesthesiology

## 2015-12-12 NOTE — Telephone Encounter (Signed)
Called patient to let him know that he will need an appt before we can prescribe the Zanaflex. Patient has appt on 12/19/15

## 2015-12-12 NOTE — Telephone Encounter (Signed)
Patient needs refill on tizanidine, please let him know when he can pick up, has appt on 12-19-15

## 2015-12-19 ENCOUNTER — Encounter: Payer: Medicare Other | Admitting: Anesthesiology

## 2015-12-20 ENCOUNTER — Telehealth: Payer: Self-pay | Admitting: *Deleted

## 2015-12-20 NOTE — Telephone Encounter (Signed)
Left voicemail for patient to call back to discuss medication refill. Patient's last visit was in early February and we will be unable to refill any of his medications without an evaluation by the physician. We can try to work the patient in on NAD but will need to discuss with Dr. Andree Elk first. Please inform patient of this should he return call.

## 2015-12-20 NOTE — Telephone Encounter (Signed)
Pt stated that he totally forgot about his appt , but he really needs a med refill. Please give him a call.Marland KitchenMarland KitchenThanks

## 2015-12-24 ENCOUNTER — Telehealth: Payer: Self-pay

## 2015-12-24 NOTE — Telephone Encounter (Signed)
Conversation had with [employer]; oxycontin as well as his tizanadine and the fact that he needs refills on both.  Patient missed his appt last week and explained to him that we do not prescribe controlled substances over the phone and he also has not been seen in our clinic since February and will need an appt.  Patient became very irrate and stated that he was chewed out the last time he needed to schedule his appt and was told he waited to long to schedule.  He also stated that our office is not being run very professionally and that was the problem as well as Dr Andree Elk scheduling.  Informed patient that I would speak to Jennifer/ Dr Andree Elk tomorrow and see how we might be able to resolve this situation.

## 2015-12-24 NOTE — Telephone Encounter (Signed)
Pt says he forgot his appt and needs his medications. He says every other doctor he goes to see he is allowed to make appts before he leaves.

## 2015-12-26 ENCOUNTER — Encounter: Payer: Self-pay | Admitting: Pain Medicine

## 2015-12-26 ENCOUNTER — Encounter: Payer: Medicare Other | Admitting: Anesthesiology

## 2015-12-26 ENCOUNTER — Ambulatory Visit: Payer: Medicare Other | Attending: Anesthesiology | Admitting: Pain Medicine

## 2015-12-26 VITALS — BP 147/99 | HR 62 | Temp 98.0°F | Resp 16 | Ht 70.0 in | Wt 220.0 lb

## 2015-12-26 DIAGNOSIS — Z79899 Other long term (current) drug therapy: Secondary | ICD-10-CM

## 2015-12-26 DIAGNOSIS — F329 Major depressive disorder, single episode, unspecified: Secondary | ICD-10-CM | POA: Diagnosis not present

## 2015-12-26 DIAGNOSIS — I10 Essential (primary) hypertension: Secondary | ICD-10-CM | POA: Diagnosis not present

## 2015-12-26 DIAGNOSIS — I2510 Atherosclerotic heart disease of native coronary artery without angina pectoris: Secondary | ICD-10-CM | POA: Insufficient documentation

## 2015-12-26 DIAGNOSIS — M109 Gout, unspecified: Secondary | ICD-10-CM | POA: Diagnosis not present

## 2015-12-26 DIAGNOSIS — M792 Neuralgia and neuritis, unspecified: Secondary | ICD-10-CM

## 2015-12-26 DIAGNOSIS — E785 Hyperlipidemia, unspecified: Secondary | ICD-10-CM | POA: Insufficient documentation

## 2015-12-26 DIAGNOSIS — M549 Dorsalgia, unspecified: Secondary | ICD-10-CM | POA: Diagnosis present

## 2015-12-26 DIAGNOSIS — M545 Low back pain, unspecified: Secondary | ICD-10-CM

## 2015-12-26 DIAGNOSIS — F1190 Opioid use, unspecified, uncomplicated: Secondary | ICD-10-CM

## 2015-12-26 DIAGNOSIS — G8929 Other chronic pain: Secondary | ICD-10-CM | POA: Insufficient documentation

## 2015-12-26 DIAGNOSIS — Z5181 Encounter for therapeutic drug level monitoring: Secondary | ICD-10-CM

## 2015-12-26 DIAGNOSIS — Z7982 Long term (current) use of aspirin: Secondary | ICD-10-CM | POA: Insufficient documentation

## 2015-12-26 DIAGNOSIS — Z87891 Personal history of nicotine dependence: Secondary | ICD-10-CM | POA: Insufficient documentation

## 2015-12-26 DIAGNOSIS — Z955 Presence of coronary angioplasty implant and graft: Secondary | ICD-10-CM | POA: Diagnosis not present

## 2015-12-26 DIAGNOSIS — Z79891 Long term (current) use of opiate analgesic: Secondary | ICD-10-CM | POA: Insufficient documentation

## 2015-12-26 DIAGNOSIS — M7918 Myalgia, other site: Secondary | ICD-10-CM | POA: Insufficient documentation

## 2015-12-26 DIAGNOSIS — E119 Type 2 diabetes mellitus without complications: Secondary | ICD-10-CM | POA: Diagnosis not present

## 2015-12-26 DIAGNOSIS — M791 Myalgia: Secondary | ICD-10-CM | POA: Diagnosis not present

## 2015-12-26 DIAGNOSIS — M79606 Pain in leg, unspecified: Secondary | ICD-10-CM | POA: Diagnosis not present

## 2015-12-26 MED ORDER — OXYCODONE HCL ER 60 MG PO T12A: 1.0000 | EXTENDED_RELEASE_TABLET | Freq: Three times a day (TID) | ORAL | Status: DC

## 2015-12-26 MED ORDER — TIZANIDINE HCL 4 MG PO TABS: 4.0000 mg | ORAL_TABLET | Freq: Every day | ORAL | Status: DC

## 2015-12-26 NOTE — Progress Notes (Signed)
Patient's Name: Peter Lamb  Patient type: Established  MRN: [id number]  Service setting: Ambulatory outpatient  DOB: [birth year]  Location: [facility]  DOS: 12/26/2015  Primary Care Physician: Maryland Pink, MD  Note by: Kathlen Brunswick. Dossie Arbour, M.D, DABA, DABAPM, DABPM, DABIPP, FIPP (Covering for Dr. Vashti Hey)  Referring Physician: Maryland Pink, MD  Specialty: Board-Certified Interventional Pain Management  Last Visit to Pain Management: 12/24/2015   Primary Reason(s) for Visit: Encounter for prescription drug management (Level of risk: moderate) CC: Back Pain   HPI  Peter Lamb is a 60 y.o. year old, male patient, who returns today as an established patient. He has DM; GOUT; DEPRESSION; CAD; Chronic pain; Long term current use of opiate analgesic; Long term prescription opiate use; Opiate use; Encounter for therapeutic drug level monitoring; Arteriosclerosis of coronary artery; Type 2 diabetes mellitus ([place]); HLD (hyperlipidemia); BP (high blood pressure); Chronic low back pain; Chronic pain of lower extremity; Musculoskeletal pain; and Neurogenic pain on his problem list.. His primarily concern today is the Back Pain   Pain Assessment: Self-Reported Pain Score: 7 , clinically he looks like a 2-3/10. Reported level is inconsistent with clinical obrservations Pain Type: Chronic pain Pain Location: Back Pain Orientation: Lower, Left Pain Descriptors / Indicators: Aching, Discomfort (nerve pain) Pain Frequency: Constant  The patient comes into the clinics today for pharmacological management of his chronic pain. I last saw this patient on Visit date not found. The patient  reports that he does not use illicit drugs. His body mass index is 31.57 kg/(m^2).  Date of Last Visit: 09/05/15 Service Provided on Last Visit: Med Refill  Controlled Substance Pharmacotherapy Assessment & REMS (Risk Evaluation and Mitigation Strategy)  Analgesic: Oxycodone ER 60 mg 3  times a day (180 mg/day of oxycodone) Pill Count: Did not bring his medications or empty bottles to the appointment for pill count. MME/day: 270 mg/day Pharmacokinetics: Onset of action (Liberation/Absorption): Within expected pharmacological parameters Time to Peak effect (Distribution): Timing and results are as within normal expected parameters Duration of action (Metabolism/Excretion): Within normal limits for medication Pharmacodynamics: Analgesic Effect: More than 50% Activity Facilitation: Medication(s) allow patient to sit, stand, walk, and do the basic ADLs Perceived Effectiveness: Described as relatively effective, allowing for increase in activities of daily living (ADL) Side-effects or Adverse reactions: None reported Monitoring: [place] PMP: Online review of the past 54-monthperiod conducted. Compliant with practice rules and regulations Last UDS on record: TOXASSURE SELECT 13  Date Value Ref Range Status  09/05/2015 FINAL  Final    Comment:    ==================================================================== TOXASSURE SELECT 13 (MW) ==================================================================== Test                             Result       Flag       Units Drug Present and Declared for Prescription Verification   Oxycodone                      >9709        EXPECTED   ng/mg creat   Oxymorphone                    2111         EXPECTED   ng/mg creat   Noroxycodone                   9490  EXPECTED   ng/mg creat   Noroxymorphone                 606          EXPECTED   ng/mg creat    Sources of oxycodone are scheduled prescription medications.    Oxymorphone, noroxycodone, and noroxymorphone are expected    metabolites of oxycodone. Oxymorphone is also available as a    scheduled prescription medication. ==================================================================== Test                      Result    Flag   Units      Ref Range   Creatinine               103              mg/dL      >=20 ==================================================================== Declared Medications:  The flagging and interpretation on this report are based on the  following declared medications.  Unexpected results may arise from  inaccuracies in the declared medications.  **Note: The testing scope of this panel includes these medications:  Oxycodone  **Note: The testing scope of this panel does not include following  reported medications:  Methocarbamol (Robaxin)  Sertraline (Zoloft) ==================================================================== For clinical consultation, please call [phone]. ====================================================================    UDS interpretation: Compliant Medication Assessment Form: Reviewed. Patient indicates being compliant with therapy Treatment compliance: Compliant Risk Assessment: Aberrant Behavior: None observed today Substance Use Disorder (SUD) Risk Level: Low-to-moderate Risk of opioid abuse or dependence: 0.7-3.0% with doses ? 36 MME/day and 6.1-26% with doses ? 120 MME/day. Opioid Risk Tool (ORT) Score: Total Score: 3 Low Risk for SUD (Score <3) Depression Scale Score: PHQ-2:      PHQ-9:       Pharmacologic Plan: No change in therapy, at this time  Previous Illicit Drug Screen Labs(s): No results found for: MDMA, COCAINSCRNUR, PCPSCRNUR, THCU  Laboratory Chemistry  Inflammation Markers No results found for: ESRSEDRATE, CRP  Renal Function Lab Results  Component Value Date   BUN 13 03/04/2010   CREATININE 1.0 03/04/2010   GFRAA >60 03/04/2010   GFRNONAA >60 03/04/2010    Hepatic Function Lab Results  Component Value Date   AST 23 03/04/2010   ALT 31 03/04/2010   ALBUMIN 4.7 03/04/2010    Electrolytes Lab Results  Component Value Date   NA 137.0 03/04/2010   K 4.4 03/04/2010   CL 99 03/04/2010   CALCIUM 9.5 03/04/2010    Pain Modulating Vitamins No results found  for: VD25OH, VD125OH2TOT, RT0211ZN3, VA7014DC3, VITAMINB12  Coagulation Parameters Lab Results  Component Value Date   PLT 197 03/04/2010    Note: Labs reviewed. and Results made available to patient  Recent Diagnostic Imaging  No results found. Lumbar Imaging: Lumbar MR wo contrast:  Results for orders placed in visit on 10/18/12  MR [facility] W/O Cm   Narrative * PRIOR REPORT IMPORTED FROM AN EXTERNAL SYSTEM *   PRIOR REPORT IMPORTED FROM THE SYNGO WORKFLOW SYSTEM   REASON FOR EXAM:    severe DDD facet arthropathy low back pain  COMMENTS:   PROCEDURE:     MMR - MMR LUMBAR SPINE WO CONTRAST  - Oct 18 2012  9:12AM   RESULT:     Sagittal and axial MR images of the lumbar spine are performed  with fat and fluid sensitive sequences. The patient has no previous  similar  study for comparison.   There is  preservation of the vertebral body heights and intervertebral  disc  spaces without evidence of marrow edema. There is minimal annular disc  bulge  at L4-L5 and L3-L4 causing some flattening of the anterior margin of the  thecal sac without severe spinal canal stenosis. No focal disc herniation  is  evident. There is no foraminal stenosis demonstrated. No extraforaminal  disc  herniation is demonstrated. Conus medullaris terminates at the superior L1  level assuming 5 lumbar type vertebral bodies. At T12-L1 level on the left  there may be some lateral disc protrusion appreciated on axial image 4 and  5  as well a 6. Correlate clinically 4 the level of the patient's symptoms.   IMPRESSION:      No severe spinal canal or foraminal stenosis. Cannot  exclude lateral to posterior lateral disc herniation in the extraforaminal  region at T12-L1 on the left. Correlate clinically.   Dictation Site: 1        Meds  The patient has a current medication list which includes the following prescription(s): aspirin, atenolol, cinnamon, diltiazem, glucosamine-chondroitin,  lisinopril, metformin, multiple vitamins-minerals, oxycodone, oxycodone, pioglitazone, rosuvastatin, sertraline, sitagliptin, sitagliptin, and tizanidine.  Current Outpatient Prescriptions on File Prior to Visit  Medication Sig  . aspirin 81 MG tablet Take 162 mg by mouth daily.  Marland Kitchen atenolol (TENORMIN) 100 MG tablet Take 50 mg by mouth 2 (two) times daily.  . Cinnamon 500 MG capsule Take 500 mg by mouth.  . diltiazem (DILTIAZEM CD) 240 MG 24 hr capsule Take 240 mg by mouth daily.  Marland Kitchen glucosamine-chondroitin 500-400 MG tablet Take 1 tablet by mouth once.  Marland Kitchen lisinopril (PRINIVIL,ZESTRIL) 20 MG tablet Take 20 mg by mouth daily.  . metFORMIN (GLUCOPHAGE) 500 MG tablet Take 500 mg by mouth daily with breakfast.  . Multiple Vitamins-Minerals (VITAMINS TO GO MEN PO) Take by mouth.  . pioglitazone (ACTOS) 30 MG tablet Take 30 mg by mouth daily.  . rosuvastatin (CRESTOR) 10 MG tablet Take 10 mg by mouth daily.  . sertraline (ZOLOFT) 50 MG tablet Take 50 mg by mouth at bedtime.  . sitaGLIPtin (JANUVIA) 100 MG tablet Take 100 mg by mouth daily.  . sitaGLIPtin (JANUVIA) 100 MG tablet Take 100 mg by mouth daily.   No current facility-administered medications on file prior to visit.    ROS  Constitutional: Denies any fever or chills Gastrointestinal: No reported hemesis, hematochezia, vomiting, or acute GI distress Musculoskeletal: Denies any acute onset joint swelling, redness, loss of ROM, or weakness Neurological: No reported episodes of acute onset apraxia, aphasia, dysarthria, agnosia, amnesia, paralysis, loss of coordination, or loss of consciousness  Allergies  Mr. Ambrosini has No Known Allergies.  [place]  Medical:  Mr. Phillippi  has a past medical history of Hypertension; Diabetes mellitus without complication ([place]); Depression; CHF (congestive heart failure) ([place]); Hyperlipidemia; and GERD (gastroesophageal reflux disease). Family: family history includes Alcohol abuse in his brother and  father; Arthritis in his maternal grandfather and mother; Asthma in his daughter; Cancer in his maternal grandfather and mother; Depression in his brother; Diabetes in his brother, maternal grandfather, and mother; Drug abuse in his daughter; Early death in his brother; Heart disease in his brother and father; Hypertension in his brother and father; Kidney disease in his brother; Mental illness in his maternal aunt; Stroke in his maternal grandmother; Varicose Veins in his mother. Surgical:  has past surgical history that includes Coronary angioplasty with stent (1996 /2003) and Appendectomy. Tobacco:  reports that he quit smoking  about 21 years ago. He does not have any smokeless tobacco history on file. Alcohol:  reports that he does not drink alcohol. Drug:  reports that he does not use illicit drugs.  Constitutional Exam  Vitals: Blood pressure 147/99, pulse 62, temperature 98 F (36.7 C), temperature source Oral, resp. rate 16, height '5\' 10"'  (1.778 m), weight 220 lb (99.791 kg), SpO2 100 %. General appearance: Well nourished, well developed, and well hydrated. In no acute distress Calculated BMI/Body habitus: Body mass index is 31.57 kg/(m^2). (30-34.9 kg/m2) Obese (Class I) - 68% higher incidence of chronic pain Psych/Mental status: Alert and oriented x 3 (person, place, & time) Eyes: PERLA Respiratory: No evidence of acute respiratory distress  Cervical Spine Exam  Inspection: No masses, redness, or swelling Alignment: Symmetrical ROM: Functional: ROM is within functional limits [facility]) Stability: No instability detected Muscle strength & Tone: Functionally intact Sensory: Unimpaired Palpation: No complaints of tenderness  Upper Extremity (UE) Exam    Side: Right upper extremity  Side: Left upper extremity  Inspection: No masses, redness, swelling, or asymmetry  Inspection: No masses, redness, swelling, or asymmetry  ROM:  ROM:  Functional: ROM is within functional limits [facility])   Functional: ROM is within functional limits [facility])  Muscle strength & Tone: Functionally intact  Muscle strength & Tone: Functionally intact  Sensory: Unimpaired  Sensory: Unimpaired  Palpation: Non-contributory  Palpation: Non-contributory   Thoracic Spine Exam  Inspection: No masses, redness, or swelling Alignment: Symmetrical ROM: Functional: ROM is within functional limits [facility]) Stability: No instability detected Sensory: Unimpaired Muscle strength & Tone: Functionally intact Palpation: No complaints of tenderness  Lumbar Spine Exam  Inspection: No masses, redness, or swelling Alignment: Symmetrical ROM: Functional: ROM is within functional limits [facility]) Stability: No instability detected Muscle strength & Tone: Functionally intact Sensory: Unimpaired Palpation: No complaints of tenderness Provocative Tests: Lumbar Hyperextension and rotation test: deferred Patrick's Maneuver: deferred  Gait & Posture Assessment  Ambulation: Unassisted Gait: Unaffected Posture: WNL  Lower Extremity Exam    Side: Right lower extremity  Side: Left lower extremity  Inspection: No masses, redness, swelling, or asymmetry ROM:  Inspection: No masses, redness, swelling, or asymmetry ROM:  Functional: ROM is within functional limits [facility])  Functional: ROM is within functional limits [facility])  Muscle strength & Tone: Functionally intact  Muscle strength & Tone: Functionally intact  Sensory: Unimpaired  Sensory: Unimpaired  Palpation: Non-contributory  Palpation: Non-contributory   Assessment & Plan  Primary Diagnosis & Pertinent Problem List: The primary encounter diagnosis was Chronic pain. Diagnoses of Long term current use of opiate analgesic, Long term prescription opiate use, Opiate use, Encounter for therapeutic drug level monitoring, Chronic low back pain, Chronic pain of lower extremity, unspecified laterality, Musculoskeletal pain, and Neurogenic pain were also pertinent to this  visit.  Visit Diagnosis: 1. Chronic pain   2. Long term current use of opiate analgesic   3. Long term prescription opiate use   4. Opiate use   5. Encounter for therapeutic drug level monitoring   6. Chronic low back pain   7. Chronic pain of lower extremity, unspecified laterality   8. Musculoskeletal pain   9. Neurogenic pain     Problems updated and reviewed during this visit: Problem  Chronic Pain  Chronic Low Back Pain  Chronic Pain of Lower Extremity  Musculoskeletal Pain  Neurogenic Pain  Long Term Current Use of Opiate Analgesic  Long Term Prescription Opiate Use  Opiate Use  Encounter for Therapeutic Drug Level Monitoring  Arteriosclerosis of Coronary Artery   Overview:  A.  12/20/2009 Xience drug eluting stent placed to a 90% proximal LAD lesion.   Type 2 Diabetes Mellitus ([facility])   Overview:  Type 2 diabetes, on orals.   Hld (Hyperlipidemia)  Bp (High Blood Pressure)    Problem-specific Plan(s): No problem-specific assessment & plan notes found for this encounter.  No new assessment & plan notes have been filed under this hospital service since the last note was generated. Service: Pain Management   Plan of Care   Problem List Items Addressed This Visit      High   Chronic low back pain (Chronic)   Relevant Medications   oxyCODONE (OXYCONTIN) 60 MG 12 hr tablet   oxyCODONE (OXYCONTIN) 60 MG 12 hr tablet   tiZANidine (ZANAFLEX) 4 MG tablet   Chronic pain - Primary (Chronic)   Relevant Medications   oxyCODONE (OXYCONTIN) 60 MG 12 hr tablet   oxyCODONE (OXYCONTIN) 60 MG 12 hr tablet   tiZANidine (ZANAFLEX) 4 MG tablet   Chronic pain of lower extremity (Chronic)   Musculoskeletal pain (Chronic)   Relevant Medications   tiZANidine (ZANAFLEX) 4 MG tablet   Neurogenic pain (Chronic)     Medium   Encounter for therapeutic drug level monitoring   Long term current use of opiate analgesic (Chronic)   Relevant Orders   ToxASSURE Select 13 (MW), Urine    Long term prescription opiate use (Chronic)   Opiate use (Chronic)       Pharmacotherapy (Medications Ordered): Meds ordered this encounter  Medications  . oxyCODONE (OXYCONTIN) 60 MG 12 hr tablet    Sig: Take 1 tablet by mouth 3 (three) times daily.    Dispense:  90 each    Refill:  0    Do not add this medication to the electronic "Automatic Refill" notification system. Patient may have prescription filled one day early if pharmacy is closed on scheduled refill date. Do not fill until: 12/26/15 To last until: 01/25/16  . oxyCODONE (OXYCONTIN) 60 MG 12 hr tablet    Sig: Take 1 tablet by mouth 3 (three) times daily.    Dispense:  90 each    Refill:  0    Do not add this medication to the electronic "Automatic Refill" notification system. Patient may have prescription filled one day early if pharmacy is closed on scheduled refill date. Do not fill until: 01/25/16 To last until: 02/24/16  . tiZANidine (ZANAFLEX) 4 MG tablet    Sig: Take 1 tablet (4 mg total) by mouth daily.    Dispense:  30 tablet    Refill:  1    Do not add this medication to the electronic "Automatic Refill" notification system. Patient may have prescription filled one day early if pharmacy is closed on scheduled refill date.    Lab-work & Procedure Ordered: Orders Placed This Encounter  Procedures  . ToxASSURE Select 13 (MW), Urine    Imaging Ordered: None  Interventional Therapies: Scheduled:  None at this time    Considering:  None at this time    PRN Procedures:  None at this time    Referral(s) or Consult(s): None at this time.  New Prescriptions   No medications on file    Medications administered during this visit: Mr. Berrios had no medications administered during this visit.  Requested PM Follow-up: Return in about 7 weeks (around 02/10/2016) for Medication Management, (2-Mo).  No future appointments.  Primary Care Physician: Maryland Pink, MD Location: [facility]  Outpatient Pain  Management Facility Note by: Kathlen Brunswick. Dossie Arbour, M.D, DABA, DABAPM, DABPM, DABIPP, FIPP  Pain Score Disclaimer: We use the NRS-11 scale. This is a self-reported, subjective measurement of pain severity with only modest accuracy. It is used primarily to identify changes within a particular patient. It must be understood that outpatient pain scales are significantly less accurate that those used for research, where they can be applied under ideal controlled circumstances with minimal exposure to variables. In reality, the score is likely to be a combination of pain intensity and pain affect, where pain affect describes the degree of emotional arousal or changes in action readiness caused by the sensory experience of pain. Factors such as social and work situation, setting, emotional state, anxiety levels, expectation, and prior pain experience may influence pain perception and show large inter-individual differences that may also be affected by time variables.  Patient instructions provided during this appointment: There are no Patient Instructions on file for this visit.

## 2015-12-26 NOTE — Progress Notes (Signed)
Safety precautions to be maintained throughout the outpatient stay will include: orient to surroundings, keep bed in low position, maintain call bell within reach at all times, provide assistance with transfer out of bed and ambulation.  

## 2016-01-03 LAB — TOXASSURE SELECT 13 (MW), URINE: PDF: 0

## 2016-02-10 ENCOUNTER — Ambulatory Visit: Payer: Medicare Other | Attending: Anesthesiology | Admitting: Anesthesiology

## 2016-02-10 ENCOUNTER — Encounter: Payer: Self-pay | Admitting: Anesthesiology

## 2016-02-10 VITALS — BP 137/77 | HR 57 | Temp 98.2°F | Resp 16 | Ht 70.0 in | Wt 212.0 lb

## 2016-02-10 DIAGNOSIS — M5116 Intervertebral disc disorders with radiculopathy, lumbar region: Secondary | ICD-10-CM | POA: Diagnosis not present

## 2016-02-10 DIAGNOSIS — M792 Neuralgia and neuritis, unspecified: Secondary | ICD-10-CM

## 2016-02-10 DIAGNOSIS — M47816 Spondylosis without myelopathy or radiculopathy, lumbar region: Secondary | ICD-10-CM | POA: Diagnosis not present

## 2016-02-10 DIAGNOSIS — Z79891 Long term (current) use of opiate analgesic: Secondary | ICD-10-CM | POA: Insufficient documentation

## 2016-02-10 DIAGNOSIS — G8929 Other chronic pain: Secondary | ICD-10-CM

## 2016-02-10 DIAGNOSIS — M79605 Pain in left leg: Secondary | ICD-10-CM | POA: Diagnosis present

## 2016-02-10 DIAGNOSIS — M545 Low back pain: Secondary | ICD-10-CM | POA: Diagnosis not present

## 2016-02-10 DIAGNOSIS — M1288 Other specific arthropathies, not elsewhere classified, other specified site: Secondary | ICD-10-CM | POA: Insufficient documentation

## 2016-02-10 MED ORDER — OXYCODONE HCL ER 60 MG PO T12A: 1.0000 | EXTENDED_RELEASE_TABLET | Freq: Three times a day (TID) | ORAL | Status: DC

## 2016-02-10 NOTE — Progress Notes (Signed)
Safety precautions to be maintained throughout the outpatient stay will include: orient to surroundings, keep bed in low position, maintain call bell within reach at all times, provide assistance with transfer out of bed and ambulation.  

## 2016-02-11 NOTE — Progress Notes (Signed)
  CC:  Left side lower back pain with persistent left side posterior lateral leg pain.  HPI:  Mossimo presents for reevaluation last seen by Dr. Dossie Arbour approximately 2 months ago. No significant changes in the quality characteristic or distribution of his low back pain or leg pain are noted. Otherwise has been in his usual state of health. He's taking his medications as prescribed with no evidence of diverting or illicit use.  Physical Exam:    PERRL, EOMI  Heart RRR   LCTA  Musculoskeletal: He continues to have pain in the left lumbar region with radiation running down the left posterior lateral leg. Some mild paraspinous muscle tenderness but no overt trigger points. His gait remains antalgic  Assessment: Lumbar degenerative disc disease with left side L5 radiculitis   2. Facet arthropathy   3. chronic opiate management      PLAN:   1. I will refill his medications for the next 2 months with return to clinic in 2 months. No further injections are warranted at this time.  2.   He has been instructed to continue follow-up with their primary care physician regarding their baseline medical care.    @James  Andree Elk, MD@

## 2016-02-20 ENCOUNTER — Other Ambulatory Visit: Payer: Self-pay | Admitting: Pain Medicine

## 2016-02-20 DIAGNOSIS — M545 Low back pain: Principal | ICD-10-CM

## 2016-02-20 DIAGNOSIS — G8929 Other chronic pain: Secondary | ICD-10-CM

## 2016-02-20 DIAGNOSIS — M7918 Myalgia, other site: Secondary | ICD-10-CM

## 2016-05-20 ENCOUNTER — Encounter: Payer: Self-pay | Admitting: Anesthesiology

## 2016-05-20 ENCOUNTER — Ambulatory Visit: Payer: Medicare Other | Attending: Anesthesiology | Admitting: Anesthesiology

## 2016-05-20 VITALS — BP 110/80 | HR 62 | Temp 98.1°F | Resp 18 | Ht 70.0 in | Wt 225.0 lb

## 2016-05-20 DIAGNOSIS — G894 Chronic pain syndrome: Secondary | ICD-10-CM | POA: Diagnosis not present

## 2016-05-20 DIAGNOSIS — M7918 Myalgia, other site: Secondary | ICD-10-CM

## 2016-05-20 DIAGNOSIS — M545 Low back pain, unspecified: Secondary | ICD-10-CM

## 2016-05-20 DIAGNOSIS — M47816 Spondylosis without myelopathy or radiculopathy, lumbar region: Secondary | ICD-10-CM

## 2016-05-20 DIAGNOSIS — M4696 Unspecified inflammatory spondylopathy, lumbar region: Secondary | ICD-10-CM

## 2016-05-20 DIAGNOSIS — M79606 Pain in leg, unspecified: Secondary | ICD-10-CM

## 2016-05-20 DIAGNOSIS — M479 Spondylosis, unspecified: Secondary | ICD-10-CM | POA: Insufficient documentation

## 2016-05-20 DIAGNOSIS — Z79891 Long term (current) use of opiate analgesic: Secondary | ICD-10-CM | POA: Diagnosis not present

## 2016-05-20 DIAGNOSIS — M5116 Intervertebral disc disorders with radiculopathy, lumbar region: Secondary | ICD-10-CM | POA: Diagnosis present

## 2016-05-20 DIAGNOSIS — F1190 Opioid use, unspecified, uncomplicated: Secondary | ICD-10-CM

## 2016-05-20 DIAGNOSIS — M791 Myalgia: Secondary | ICD-10-CM

## 2016-05-20 DIAGNOSIS — G8929 Other chronic pain: Secondary | ICD-10-CM

## 2016-05-20 MED ORDER — OXYCODONE HCL ER 60 MG PO T12A: 1.0000 | EXTENDED_RELEASE_TABLET | Freq: Three times a day (TID) | ORAL | 0 refills | Status: DC

## 2016-05-20 MED ORDER — TIZANIDINE HCL 4 MG PO TABS: 4.0000 mg | ORAL_TABLET | Freq: Every day | ORAL | 2 refills | Status: DC

## 2016-05-20 NOTE — Progress Notes (Signed)
Safety precautions to be maintained throughout the outpatient stay will include: orient to surroundings, keep bed in low position, maintain call bell within reach at all times, provide assistance with transfer out of bed and ambulation.  

## 2016-05-21 NOTE — Progress Notes (Signed)
  CC:  Left side lower back pain with persistent left side posterior lateral leg pain.  HPI:  Peter Lamb presents for reevaluation last seen approximately 3 months ago. He has been doing well with his current medication regimen and continue  to do well with this regimen. He's been stable with these medications for several years. He denies any diverting or illicit use. Based on his narcotic assessment sheet he continues to respond favorably to the medications improving his daily lifestyle without significant side effects. The quality characteristic and distribution of his low back pain has been stable and has failed conservative therapy.  Physical Exam:    PERRL, EOMI  Heart RRR   LCTA  Musculoskeletal: He continues to have pain in the left lumbar region with radiation running down the left posterior lateral leg. Some mild paraspinous muscle tenderness but no overt trigger points. His gait remains antalgic  Assessment: Lumbar degenerative disc disease with left side L5 radiculitis   2. Facet arthropathy   3. chronic opiate management      PLAN:   1. I will refill his medications for the next 3 months with return to clinic in 25months. No further injections are warranted at this time.  2.   He has been instructed to continue follow-up with their primary care physician regarding their baseline medical care.    @Lalani Winkles  Andree Elk, MD@

## 2016-08-20 ENCOUNTER — Ambulatory Visit: Payer: Medicare Other | Attending: Anesthesiology | Admitting: Anesthesiology

## 2016-08-20 ENCOUNTER — Encounter: Payer: Self-pay | Admitting: Anesthesiology

## 2016-08-20 VITALS — BP 148/74 | HR 56 | Temp 97.7°F | Resp 16 | Ht 70.0 in | Wt 235.0 lb

## 2016-08-20 DIAGNOSIS — G894 Chronic pain syndrome: Secondary | ICD-10-CM | POA: Diagnosis not present

## 2016-08-20 DIAGNOSIS — M47816 Spondylosis without myelopathy or radiculopathy, lumbar region: Secondary | ICD-10-CM

## 2016-08-20 DIAGNOSIS — Z79891 Long term (current) use of opiate analgesic: Secondary | ICD-10-CM | POA: Diagnosis not present

## 2016-08-20 DIAGNOSIS — G8929 Other chronic pain: Secondary | ICD-10-CM

## 2016-08-20 DIAGNOSIS — M545 Low back pain, unspecified: Secondary | ICD-10-CM

## 2016-08-20 DIAGNOSIS — M4696 Unspecified inflammatory spondylopathy, lumbar region: Secondary | ICD-10-CM | POA: Diagnosis not present

## 2016-08-20 DIAGNOSIS — M5116 Intervertebral disc disorders with radiculopathy, lumbar region: Secondary | ICD-10-CM | POA: Insufficient documentation

## 2016-08-20 DIAGNOSIS — M791 Myalgia: Secondary | ICD-10-CM

## 2016-08-20 DIAGNOSIS — M7918 Myalgia, other site: Secondary | ICD-10-CM

## 2016-08-20 MED ORDER — OXYCODONE HCL ER 60 MG PO T12A: 1.0000 | EXTENDED_RELEASE_TABLET | Freq: Three times a day (TID) | ORAL | 0 refills | Status: DC

## 2016-08-20 MED ORDER — TIZANIDINE HCL 4 MG PO TABS: 4.0000 mg | ORAL_TABLET | Freq: Every day | ORAL | 2 refills | Status: DC

## 2016-08-20 NOTE — Progress Notes (Signed)
Safety precautions to be maintained throughout the outpatient stay will include: orient to surroundings, keep bed in low position, maintain call bell within reach at all times, provide assistance with transfer out of bed and ambulation.  

## 2016-08-20 NOTE — Progress Notes (Signed)
  CC:  Left side lower back pain with persistent left side posterior lateral leg pain.  HPI:  Keithon was last seen approximately 3 months ago. The quality characteristic and distribution of his low back pain a been stable in nature. He continues to do well with his medication regimen. Based on our discussion today the severity of his pain is less, the frequency diminished and overall better tolerated. He feels that he does better with the medications then without and he has failed conservative therapy in the past with this refractory low back pain. I have reviewed the practitioner database and it is appropriate. I have also reviewed the narcotic assessment sheet and he appears to be doing well with this regimen. Physical Exam:    PERRL, EOMI  Heart RRR   LCTA  Musculoskeletal: Mild paraspinous low back pain with lower extremity strength at baseline   Assessment:  1. 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 Facet arthropathy  3. chronic opioid management      PLAN:   1. Continue with his current regimen and we will refill his medications for the next 3 months. Should he have any changes in his clinical symptom complex he is to contact us for further evaluation. Furthermore he is to continue follow-up with his primary care physicians.  Vashti Hey, MD

## 2016-08-21 ENCOUNTER — Emergency Department: Payer: Medicare Other

## 2016-08-21 ENCOUNTER — Encounter: Payer: Self-pay | Admitting: Emergency Medicine

## 2016-08-21 ENCOUNTER — Emergency Department
Admission: EM | Admit: 2016-08-21 | Discharge: 2016-08-22 | Disposition: A | Discharge status: Home / Self Care | Payer: Medicare Other | Attending: Emergency Medicine | Admitting: Emergency Medicine

## 2016-08-21 ENCOUNTER — Other Ambulatory Visit: Payer: Self-pay

## 2016-08-21 ENCOUNTER — Other Ambulatory Visit: Payer: Self-pay | Admitting: Anesthesiology

## 2016-08-21 DIAGNOSIS — Z79899 Other long term (current) drug therapy: Secondary | ICD-10-CM | POA: Insufficient documentation

## 2016-08-21 DIAGNOSIS — I509 Heart failure, unspecified: Secondary | ICD-10-CM | POA: Insufficient documentation

## 2016-08-21 DIAGNOSIS — M545 Low back pain, unspecified: Secondary | ICD-10-CM

## 2016-08-21 DIAGNOSIS — Z87891 Personal history of nicotine dependence: Secondary | ICD-10-CM | POA: Diagnosis not present

## 2016-08-21 DIAGNOSIS — Z7984 Long term (current) use of oral hypoglycemic drugs: Secondary | ICD-10-CM | POA: Insufficient documentation

## 2016-08-21 DIAGNOSIS — R0789 Other chest pain: Secondary | ICD-10-CM | POA: Diagnosis not present

## 2016-08-21 DIAGNOSIS — I2510 Atherosclerotic heart disease of native coronary artery without angina pectoris: Secondary | ICD-10-CM | POA: Diagnosis not present

## 2016-08-21 DIAGNOSIS — I110 Hypertensive heart disease with heart failure: Secondary | ICD-10-CM | POA: Diagnosis not present

## 2016-08-21 DIAGNOSIS — Z7982 Long term (current) use of aspirin: Secondary | ICD-10-CM | POA: Insufficient documentation

## 2016-08-21 DIAGNOSIS — E119 Type 2 diabetes mellitus without complications: Secondary | ICD-10-CM | POA: Diagnosis not present

## 2016-08-21 DIAGNOSIS — M7918 Myalgia, other site: Secondary | ICD-10-CM

## 2016-08-21 LAB — CBC
HCT: 43.6 % (ref 40.0–52.0)
HEMOGLOBIN: 15.3 g/dL (ref 13.0–18.0)
MCH: 30.4 pg (ref 26.0–34.0)
MCHC: 35.2 g/dL (ref 32.0–36.0)
MCV: 86.4 fL (ref 80.0–100.0)
PLATELETS: 219 10*3/uL (ref 150–440)
RBC: 5.04 MIL/uL (ref 4.40–5.90)
RDW: 13.6 % (ref 11.5–14.5)
WBC: 9.2 10*3/uL (ref 3.8–10.6)

## 2016-08-21 LAB — BASIC METABOLIC PANEL
ANION GAP: 9 (ref 5–15)
BUN: 11 mg/dL (ref 6–20)
CALCIUM: 9.4 mg/dL (ref 8.9–10.3)
CO2: 26 mmol/L (ref 22–32)
CREATININE: 0.92 mg/dL (ref 0.61–1.24)
Chloride: 100 mmol/L — ABNORMAL LOW (ref 101–111)
GFR calc Af Amer: >60 mL/min (ref >60)
GFR calc non Af Amer: >60 mL/min (ref >60)
Glucose, Bld: 132 mg/dL — ABNORMAL HIGH (ref 65–99)
Potassium: 3.7 mmol/L (ref 3.5–5.1)
SODIUM: 135 mmol/L (ref 135–145)

## 2016-08-21 LAB — TROPONIN I

## 2016-08-21 MED ORDER — HYDROMORPHONE HCL 1 MG/ML IJ SOLN
1.0000 mg | Freq: Once | INTRAMUSCULAR | Status: AC
  Administered 2016-08-21: 1 mg via INTRAMUSCULAR
  Filled 2016-08-21: qty 1

## 2016-08-21 NOTE — ED Notes (Addendum)
Pt reports that he has chronic back pain that he takes oxycontin for. Pt saw his "pain doctor" on Thursday and reports that his refill prescriptions "had the wrong date" on them. Pt unable to sleep x 3 days due to pain. Pt pacing around room. States he is unable to sit down on stretcher. Pt reporting chest tightness in lobby that has since resolved. Pt believes that his chest tightness "is from lack of sleep".

## 2016-08-21 NOTE — ED Provider Notes (Signed)
[facility] Emergency Department Provider Note  ____________________________________________   I have reviewed the triage vital signs and the nursing notes.   HISTORY  Chief Complaint Back Pain    HPI Peter Lamb is a 61 y.o. male and a history of chronic back pain. Patient states he has been having back pain for "decades". Patient missed time filling his OxyContin prescription and he is having back pain. His prescription will come available tomorrow, Saturday, but he wanted something to "tide him over" because his back pain, which is chronic and unchanged from baseline, as persisted. Patient states he has not slept in 3 days because of the pain. He denies any focal neurologic numbness or weakness or incontinence of bowel or bladder or any other concerns. Patient states that he is full body felt tight lungs in the emergency room waiting room earlier today. He does not think this represents ACS. He did not have any chest pain or shortness of breath he just felt "tight". Patient is adamant this feels nothing like his angina and he refuses further workup for it. He just wants something for his back.      Past Medical History:  Diagnosis Date  . CHF (congestive heart failure) ([place])   . Depression   . Diabetes mellitus without complication ([place])   . GERD (gastroesophageal reflux disease)   . Hyperlipidemia   . Hypertension     Patient Active Problem List   Diagnosis Date Noted  . Chronic pain 12/26/2015  . Long term current use of opiate analgesic 12/26/2015  . Long term prescription opiate use 12/26/2015  . Opiate use 12/26/2015  . Encounter for therapeutic drug level monitoring 12/26/2015  . Chronic low back pain 12/26/2015  . Chronic pain of lower extremity 12/26/2015  . Musculoskeletal pain 12/26/2015  . Neurogenic pain 12/26/2015  . Arteriosclerosis of coronary artery 05/13/2011  . Type 2 diabetes mellitus ([place]) 05/13/2011  . HLD (hyperlipidemia)  05/13/2011  . BP (high blood pressure) 05/13/2011  . DM 03/26/2010  . GOUT 03/26/2010  . DEPRESSION 03/26/2010  . CAD 03/26/2010    Past Surgical History:  Procedure Laterality Date  . APPENDECTOMY    . CORONARY ANGIOPLASTY WITH STENT PLACEMENT  1996 /2003    Prior to Admission medications   Medication Sig Start Date End Date Taking? Authorizing Provider  aspirin 81 MG tablet Take 162 mg by mouth daily.    Historical Provider, MD  atenolol (TENORMIN) 100 MG tablet Take 50 mg by mouth 2 (two) times daily.    Historical Provider, MD  Cinnamon 500 MG capsule Take 500 mg by mouth.    Historical Provider, MD  diltiazem (DILTIAZEM CD) 240 MG 24 hr capsule Take 240 mg by mouth daily.    Historical Provider, MD  glucosamine-chondroitin 500-400 MG tablet Take 1 tablet by mouth once.    Historical Provider, MD  glyBURIDE (DIABETA) 2.5 MG tablet Take 2.5 mg by mouth daily with breakfast.    Historical Provider, MD  lisinopril (PRINIVIL,ZESTRIL) 20 MG tablet Take 20 mg by mouth daily.    Historical Provider, MD  metFORMIN (GLUCOPHAGE) 500 MG tablet Take 500 mg by mouth daily with breakfast. Reported on 02/10/2016    Historical Provider, MD  Multiple Vitamins-Minerals (VITAMINS TO GO MEN PO) Take 1 tablet by mouth daily.     Historical Provider, MD  oxyCODONE (OXYCONTIN) 60 MG 12 hr tablet Take 1 tablet by mouth 3 (three) times daily. 08/20/16   Molli Barrows, MD  pioglitazone (ACTOS) 30 MG tablet Take 30 mg by mouth daily.    Historical Provider, MD  rosuvastatin (CRESTOR) 10 MG tablet Take 10 mg by mouth daily.    Historical Provider, MD  sertraline (ZOLOFT) 50 MG tablet Take 50 mg by mouth at bedtime.    Historical Provider, MD  sitaGLIPtin (JANUVIA) 100 MG tablet Take 100 mg by mouth daily.    Historical Provider, MD  sitaGLIPtin (JANUVIA) 100 MG tablet Take 100 mg by mouth daily.    Historical Provider, MD  tiZANidine (ZANAFLEX) 4 MG tablet Take 1 tablet (4 mg total) by mouth daily. 08/20/16    Molli Barrows, MD    Allergies Patient has no known allergies.  Family History  Problem Relation Age of Onset  . Arthritis Mother   . Cancer Mother   . Diabetes Mother   . Varicose Veins Mother   . Alcohol abuse Father   . Heart disease Father   . Hypertension Father   . Asthma Daughter   . Drug abuse Daughter   . Stroke Maternal Grandmother   . Arthritis Maternal Grandfather   . Cancer Maternal Grandfather   . Diabetes Maternal Grandfather   . Alcohol abuse Brother   . Depression Brother   . Diabetes Brother   . Early death Brother   . Heart disease Brother   . Hypertension Brother   . Kidney disease Brother   . Mental illness Maternal Aunt     Social History Social History  Substance Use Topics  . Smoking status: Former Smoker    Quit date: 02/12/1994  . Smokeless tobacco: Former Systems developer  . Alcohol use No    Review of Systems Constitutional: No fever/chills Eyes: No visual changes. ENT: No sore throat. No stiff neck no neck pain Cardiovascular: Denies chest pain. Respiratory: Denies shortness of breath. Gastrointestinal:   no vomiting.  No diarrhea.  No constipation. Genitourinary: Negative for dysuria. Musculoskeletal: Negative lower extremity swelling Skin: Negative for rash. Neurological: Negative for severe headaches, focal weakness or numbness. 10-point ROS otherwise negative.  ____________________________________________   PHYSICAL EXAM:  VITAL SIGNS: ED Triage Vitals  Enc Vitals Group     BP 08/21/16 2150 (!) 150/93     Pulse Rate 08/21/16 2150 60     Resp --      Temp --      Temp Source 08/21/16 2150 Oral     SpO2 08/21/16 2150 97 %     Weight 08/21/16 2151 235 lb (106.6 kg)     Height 08/21/16 2151 5\' 10"  (1.778 m)     Head Circumference --      Peak Flow --      Pain Score 08/21/16 2151 8     Pain Loc --      Pain Edu? --      Excl. in [place]? --     Constitutional: Alert and oriented. Well appearing and in no acute distress. Eyes:  Conjunctivae are normal. PERRL. EOMI. Head: Atraumatic. Nose: No congestion/rhinnorhea. Mouth/Throat: Mucous membranes are moist.  Oropharynx non-erythematous. Neck: No stridor.   Nontender with no meningismus Cardiovascular: Normal rate, regular rhythm. Grossly normal heart sounds.  Good peripheral circulation. Respiratory: Normal respiratory effort.  No retractions. Lungs CTAB. Abdominal: Soft and nontender. No distention. No guarding no rebound Back:  There is no  step off. There is minimal paraspinal tenderness in the lower back, there is no midline tenderness there are no lesions noted. there is no CVA tenderness Musculoskeletal: No  lower extremity tenderness, no upper extremity tenderness. No joint effusions, no DVT signs strong distal pulses no edema Neurologic:  Normal speech and language. No gross focal neurologic deficits are appreciated.  Skin:  Skin is warm, dry and intact. No rash noted. Psychiatric: Mood and affect are normal. Speech and behavior are normal.  ____________________________________________   LABS (all labs ordered are listed, but only abnormal results are displayed)  Labs Reviewed  BASIC METABOLIC PANEL - Abnormal; Notable for the following:       Result Value   Chloride 100 (*)    Glucose, Bld 132 (*)    All other components within normal limits  CBC  TROPONIN I   ____________________________________________  EKG  I personally interpreted any EKGs ordered by me or triage Sinus rhythm rate 60 bpm, no acute ST elevation or depression nonspecific ST changes ____________________________________________  RADIOLOGY  I reviewed any imaging ordered by me or triage that were performed during my shift and, if possible, patient and/or family made aware of any abnormal findings. ____________________________________________   PROCEDURES  Procedure(s) performed: None  Procedures  Critical Care performed:  None  ____________________________________________   INITIAL IMPRESSION / ASSESSMENT AND PLAN / ED COURSE  Pertinent labs & imaging results that were available during my care of the patient were reviewed by me and considered in my medical decision making (see chart for details).  Patient here with chronic back, nothing to suggest cauda equina syndrome or AAA, he simply out of his narcotic pain medication. I've explained him that unfortunately I cannot provide him with an OxyContin prescription today. I can give him a dose of pain medication here to see if we can help him with this discomfort. He is very comfortable with this. I did offer her and encourage him to stay for further evaluation of his nonspecific tightness which seemed to involve his chest. I did inform him that the patient ran a risk of significant cardiac outcome including death, if he did not allow me to do serial troponins and further evaluate this. Patient refuses. He is not having any of that pain he states it was discontinued is anxious because he is not getting his narcotics. He understands that he can die from heart disease if he gets home he does have a heart history. Given the patient refuses further workup, I will give him an IM shot of pain medication here in hopes of alleviating some of his discomfort. He is in no acute distress, he is ambulatory and neurologically intact. We will hopefully allow him therefore to follow closely with his primary care doctor using encouraged to return to the emergency room for any chest pain shortness of breath or any change in his condition that is of concern to him.  ____________________________________________   FINAL CLINICAL IMPRESSION(S) / ED DIAGNOSES  Final diagnoses:  None      This chart was dictated using voice recognition software.  Despite best efforts to proofread,  errors can occur which can change meaning.      Schuyler Amor, MD 08/21/16 [id number]

## 2016-08-21 NOTE — Discharge Instructions (Signed)
When you  came in today who told us that you're having chest pain or tightness. However, you prefer  not to allow [place] to continue to work you up for this. Specifically, you  do not wish further cardiac enzymes or assessment. As we discussed, there is no way I can tell you that your heart is safe to go home at this time without further evaluation. You have expressed that you would prefer to go home. Of course this is your choice but if you feel worse in any way we encourage you to return and we strongly encourage you to follow up closely with your  primary care doctor.  In addition, we strongly advised you to follow-up with her pain doctor, unfortunately we cannot write for OxyContin here. Finally, you're instructed not to drive after receiving pain medication here.

## 2016-08-21 NOTE — ED Triage Notes (Addendum)
C/O back injury years ago.  Patient's physician prescribed patient Oxycontin 60 mg for pain, but patient unable to fill RX until this week end.  Seen by Dr. Andree Elk at pain clinic.  Patient also c/o left chest tightness that started this evening.  Patient states the pain must be due to his lack of sleep for the past 3 days due to the back pain.

## 2016-08-26 LAB — TOXASSURE SELECT 13 (MW), URINE

## 2016-09-21 ENCOUNTER — Telehealth: Payer: Self-pay | Admitting: Anesthesiology

## 2016-09-21 NOTE — Telephone Encounter (Signed)
Spoke with Dr. Andree Elk- ok for patient to pick up 5 days worth of medication at [facility]. Dr. Andree Elk to write for medication on formulary tomorrow since oxycontin not covered.

## 2016-09-21 NOTE — Telephone Encounter (Signed)
Patient went to get oxycontin filled today and pharmacy told him his insurance would not pay for this med anymore. [facility] is supposed [facility] list of meds that the insurance will cover. Please call him as soon as you get and let him know what he can do as he is out of his meds.

## 2016-09-24 ENCOUNTER — Telehealth: Payer: Self-pay | Admitting: *Deleted

## 2016-09-24 NOTE — Telephone Encounter (Signed)
Patient called and informed that script is ready for pick up. Medication is different but is on his preferred list per his insurance.

## 2016-09-25 NOTE — Telephone Encounter (Signed)
Peter Lamb picked up his Rx that was left up front for him.  States that this will Rx #2 and will most likely need another Rx #3 for this same medication.  Told patient to please call next week so we can check on this with Anderson Malta and Dr Andree Elk.

## 2016-09-29 NOTE — Telephone Encounter (Signed)
Left message for patient to return call re: scripts. He will need follow up appointment prior to next refill since he has changed medications.

## 2016-10-20 ENCOUNTER — Ambulatory Visit: Payer: Medicare Other | Attending: Anesthesiology | Admitting: Anesthesiology

## 2016-10-20 ENCOUNTER — Encounter: Payer: Self-pay | Admitting: Anesthesiology

## 2016-10-20 VITALS — BP 140/62 | HR 56 | Temp 98.0°F | Resp 20 | Ht 70.0 in | Wt 230.0 lb

## 2016-10-20 DIAGNOSIS — G8929 Other chronic pain: Secondary | ICD-10-CM | POA: Diagnosis not present

## 2016-10-20 DIAGNOSIS — M4696 Unspecified inflammatory spondylopathy, lumbar region: Secondary | ICD-10-CM | POA: Diagnosis not present

## 2016-10-20 DIAGNOSIS — M5116 Intervertebral disc disorders with radiculopathy, lumbar region: Secondary | ICD-10-CM | POA: Insufficient documentation

## 2016-10-20 DIAGNOSIS — M545 Low back pain, unspecified: Secondary | ICD-10-CM

## 2016-10-20 DIAGNOSIS — M79605 Pain in left leg: Secondary | ICD-10-CM | POA: Insufficient documentation

## 2016-10-20 DIAGNOSIS — M792 Neuralgia and neuritis, unspecified: Secondary | ICD-10-CM

## 2016-10-20 DIAGNOSIS — G894 Chronic pain syndrome: Secondary | ICD-10-CM | POA: Diagnosis not present

## 2016-10-20 DIAGNOSIS — M1288 Other specific arthropathies, not elsewhere classified, other specified site: Secondary | ICD-10-CM | POA: Diagnosis not present

## 2016-10-20 DIAGNOSIS — Z79891 Long term (current) use of opiate analgesic: Secondary | ICD-10-CM

## 2016-10-20 DIAGNOSIS — M47816 Spondylosis without myelopathy or radiculopathy, lumbar region: Secondary | ICD-10-CM

## 2016-10-20 MED ORDER — OXYCODONE ER 36 MG PO C12A: 2.0000 | EXTENDED_RELEASE_CAPSULE | Freq: Two times a day (BID) | ORAL | 0 refills | Status: AC

## 2016-10-20 MED ORDER — NALOXONE HCL 4 MG/0.1ML NA LIQD: NASAL | 2 refills | Status: DC

## 2016-10-20 MED ORDER — OXYCODONE ER 36 MG PO C12A: 2.0000 | EXTENDED_RELEASE_CAPSULE | Freq: Two times a day (BID) | ORAL | 0 refills | Status: DC

## 2016-10-20 NOTE — Progress Notes (Signed)
Safety precautions to be maintained throughout the outpatient stay will include: orient to surroundings, keep bed in low position, maintain call bell within reach at all times, provide assistance with transfer out of bed and ambulation.  

## 2016-10-21 NOTE — Progress Notes (Signed)
  CC:  Left side lower back pain with persistent left side posterior lateral leg pain.  HPI:  Peter Lamb presents today for reevaluation last seen in January. We recently changed his medication over Extramza 36 mg tablet. He takes to these twice a day and this is working well for him. The quality characteristic distribution of his pain remains stable in nature. It is well controlled with his current medication regimen with his recent switch from OxyContin. He is doing better overall and based on his narcotic assessment sheet his lifestyle function continued to respond favorably to the medication management. Without this medication previously he was in severe pain with failed conservative management. We have also reviewed the [facility] practitioner database and it is appropriate.   Physical Exam:    PERRL, EOMI  Heart RRR   LCTA  He has a mildly antalgic gait. His strength appears to be at baseline.   Assessment:  1. 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 Facet arthropathy  3. chronic opioid management      PLAN:   1. I will refill his medications for the next 2 months with a return in 2 months. We will review the [facility] practitioner database for his opioid management and it is appropriate.   Vashti Hey, MD

## 2016-11-16 ENCOUNTER — Other Ambulatory Visit: Payer: Self-pay | Admitting: Anesthesiology

## 2016-11-16 DIAGNOSIS — M7918 Myalgia, other site: Secondary | ICD-10-CM

## 2016-11-23 ENCOUNTER — Other Ambulatory Visit: Payer: Self-pay | Admitting: *Deleted

## 2016-11-23 ENCOUNTER — Telehealth: Payer: Self-pay | Admitting: Anesthesiology

## 2016-11-23 DIAGNOSIS — M7918 Myalgia, other site: Secondary | ICD-10-CM

## 2016-11-23 MED ORDER — TIZANIDINE HCL 4 MG PO TABS: 4.0000 mg | ORAL_TABLET | Freq: Every day | ORAL | 2 refills | Status: DC

## 2016-11-23 NOTE — Telephone Encounter (Signed)
Med e[facility] to [facility] . Patient notified.

## 2016-11-23 NOTE — Telephone Encounter (Signed)
lvmail stating he needs muscle relaxer called to [facility] [address]

## 2016-12-16 ENCOUNTER — Ambulatory Visit: Payer: Medicare Other | Attending: Anesthesiology | Admitting: Nurse Practitioner

## 2016-12-16 ENCOUNTER — Encounter: Payer: Self-pay | Admitting: Nurse Practitioner

## 2016-12-16 VITALS — BP 161/97 | HR 55 | Temp 98.0°F | Resp 18 | Ht 70.0 in | Wt 220.0 lb

## 2016-12-16 DIAGNOSIS — I2510 Atherosclerotic heart disease of native coronary artery without angina pectoris: Secondary | ICD-10-CM | POA: Insufficient documentation

## 2016-12-16 DIAGNOSIS — Z7982 Long term (current) use of aspirin: Secondary | ICD-10-CM | POA: Diagnosis not present

## 2016-12-16 DIAGNOSIS — Z6831 Body mass index (BMI) 31.0-31.9, adult: Secondary | ICD-10-CM | POA: Diagnosis not present

## 2016-12-16 DIAGNOSIS — R531 Weakness: Secondary | ICD-10-CM | POA: Insufficient documentation

## 2016-12-16 DIAGNOSIS — F329 Major depressive disorder, single episode, unspecified: Secondary | ICD-10-CM | POA: Diagnosis not present

## 2016-12-16 DIAGNOSIS — I509 Heart failure, unspecified: Secondary | ICD-10-CM | POA: Diagnosis not present

## 2016-12-16 DIAGNOSIS — Z7984 Long term (current) use of oral hypoglycemic drugs: Secondary | ICD-10-CM | POA: Insufficient documentation

## 2016-12-16 DIAGNOSIS — E785 Hyperlipidemia, unspecified: Secondary | ICD-10-CM | POA: Insufficient documentation

## 2016-12-16 DIAGNOSIS — E119 Type 2 diabetes mellitus without complications: Secondary | ICD-10-CM | POA: Insufficient documentation

## 2016-12-16 DIAGNOSIS — K219 Gastro-esophageal reflux disease without esophagitis: Secondary | ICD-10-CM | POA: Diagnosis not present

## 2016-12-16 DIAGNOSIS — M5136 Other intervertebral disc degeneration, lumbar region: Secondary | ICD-10-CM | POA: Diagnosis not present

## 2016-12-16 DIAGNOSIS — G894 Chronic pain syndrome: Secondary | ICD-10-CM | POA: Insufficient documentation

## 2016-12-16 DIAGNOSIS — I110 Hypertensive heart disease with heart failure: Secondary | ICD-10-CM | POA: Insufficient documentation

## 2016-12-16 DIAGNOSIS — M109 Gout, unspecified: Secondary | ICD-10-CM | POA: Insufficient documentation

## 2016-12-16 DIAGNOSIS — Z79899 Other long term (current) drug therapy: Secondary | ICD-10-CM | POA: Diagnosis not present

## 2016-12-16 DIAGNOSIS — Z87891 Personal history of nicotine dependence: Secondary | ICD-10-CM | POA: Diagnosis not present

## 2016-12-16 DIAGNOSIS — Z79891 Long term (current) use of opiate analgesic: Secondary | ICD-10-CM | POA: Diagnosis not present

## 2016-12-16 DIAGNOSIS — M7918 Myalgia, other site: Secondary | ICD-10-CM

## 2016-12-16 DIAGNOSIS — Z955 Presence of coronary angioplasty implant and graft: Secondary | ICD-10-CM | POA: Diagnosis not present

## 2016-12-16 DIAGNOSIS — M791 Myalgia: Secondary | ICD-10-CM | POA: Diagnosis not present

## 2016-12-16 DIAGNOSIS — Z9889 Other specified postprocedural states: Secondary | ICD-10-CM | POA: Diagnosis not present

## 2016-12-16 MED ORDER — XTAMPZA ER 36 MG PO C12A: 2.0000 | EXTENDED_RELEASE_CAPSULE | Freq: Two times a day (BID) | ORAL | 0 refills | Status: DC

## 2016-12-16 NOTE — Progress Notes (Signed)
Patient's Name: Peter Lamb  MRN: [id number]  Referring Provider: Maryland Pink, MD  DOB: 11/23/1955  PCP: Maryland Pink, MD  DOS: 12/16/2016  Note by: Vevelyn Francois NP  Service setting: Ambulatory outpatient  Specialty: Interventional Pain Management  Location: [facility] (AMB) Pain Management Facility    Patient type: Established    Primary Reason(s) for Visit: Encounter for prescription drug management (Level of risk: moderate) CC: Back Pain (lower)  HPI  Peter Lamb is a 61 y.o. year old, male patient, who comes today for a medication management evaluation. He has DM; GOUT; DEPRESSION; CAD; Chronic pain; Long term current use of opiate analgesic; Long term prescription opiate use; Opiate use; Encounter for therapeutic drug level monitoring; Arteriosclerosis of coronary artery; Type 2 diabetes mellitus ([place]); HLD (hyperlipidemia); BP (high blood pressure); Chronic low back pain; Chronic pain of lower extremity; Musculoskeletal pain; Neurogenic pain; and DDD (degenerative disc disease), lumbar on his problem list. His primarily concern today is the Back Pain (lower)  Pain Assessment: Self-Reported Pain Score: 3 /10             Reported level is compatible with observation.       Pain Location: Back Pain Orientation: Lower, Left Pain Descriptors / Indicators: Constant, Dull, Aching Pain Frequency: Constant  Peter Lamb was last scheduled for an appointment on 10/20/16 for medication management. During today's appointment we reviewed Peter Lamb chronic pain status, as well as his outpatient medication regimen. He has chronic low back pain. He denies any radicular symptoms. He denies any numbness and tingling. He has some occasional weakness in his in leg. He is currently taking Xtampza, he denies any pain or problems with this. He denies any constipation. He does use stool softener.   The patient  reports that he does not use drugs. His body mass index is 31.57 kg/m.  Further details on  both, my assessment(s), as well as the proposed treatment plan, please see below.  Controlled Substance Pharmacotherapy Assessment REMS (Risk Evaluation and Mitigation Strategy)  Analgesic:Xtampza 29m 2 tab 2 times a day (160 mg/day of oxycodone) MME/day:240 mg/ day Down from 270 mg/day GIgnatius Specking RN  12/16/2016  1:29 PM  Sign at close encounter Nursing Pain Medication Assessment:  Safety precautions to be maintained throughout the outpatient stay will include: orient to surroundings, keep bed in low position, maintain call bell within reach at all times, provide assistance with transfer out of bed and ambulation.  Medication Inspection Compliance: Peter Lamb not comply with our request to bring his pills to be counted. He was reminded that bringing the medication bottles, even when empty, is a requirement.  Medication: None brought in. Pill/Patch Count: None available to be counted. Bottle Appearance: No container available. Did not bring bottle(s) to appointment. Filled Date: N/A Last Medication intake: Today   Pharmacokinetics: Liberation and absorption (onset of action): WNL Distribution (time to peak effect): WNL Metabolism and excretion (duration of action): WNL         Pharmacodynamics: Desired effects: Analgesia: Peter Lamb >50% benefit. Functional ability: Patient reports that medication allows him to accomplish basic ADLs Clinically meaningful improvement in function (CMIF): Sustained CMIF goals met Perceived effectiveness: Described as relatively effective, allowing for increase in activities of daily living (ADL) Undesirable effects: Side-effects or Adverse reactions: None reported Monitoring: [place] PMP: Online review of the past 141-montheriod conducted. Compliant with practice rules and regulations List of all UDS test(s) done:  Lab Results  Component Value Date  [place] FINAL 08/20/2016   TOXASSSELUR FINAL 12/26/2015   TOXASSSELUR FINAL  09/05/2015   Last UDS on record: ToxAssure Select 13  Date Value Ref Range Status  08/20/2016 FINAL  Final    Comment:    ==================================================================== TOXASSURE SELECT 13 (MW) ==================================================================== Specimen Alert Note:  Urinary creatinine is low; ability to detect some drugs may be compromised.  Interpret results with caution. ==================================================================== Test                             Result       Flag       Units Drug Present and Declared for Prescription Verification   Oxycodone                      3733         EXPECTED   ng/mg creat   Oxymorphone                    1522         EXPECTED   ng/mg creat   Noroxycodone                   8506         EXPECTED   ng/mg creat   Noroxymorphone                 750          EXPECTED   ng/mg creat    Sources of oxycodone are scheduled prescription medications.    Oxymorphone, noroxycodone, and noroxymorphone are expected    metabolites of oxycodone. Oxymorphone is also available as a    scheduled prescription medication. ==================================================================== Test                      Result    Flag   Units      Ref Range   Creatinine              18        L      mg/dL      >=20 ==================================================================== Declared Medications:  The flagging and interpretation on this report are based on the  following declared medications.  Unexpected results may arise from  inaccuracies in the declared medications.  **Note: The testing scope of this panel includes these medications:  Oxycodone  **Note: The testing scope of this panel does not include following  reported medications:  Aspirin (Aspirin 81)  Atenolol  Chondroitin (Glucosamine-Chondroitin)  Diltiazem (Cardizem CD)  Glucosamine (Glucosamine-Chondroitin)  Glyburide  Lisinopril   Metformin  Multivitamin  Pioglitazone (Actos)  Rosuvastatin (Crestor)  Sertraline (Zoloft)  Sitagliptin (Januvia)  Supplement  Tizanidine (Zanaflex) ==================================================================== For clinical consultation, please call [phone]. ====================================================================    UDS interpretation: Compliant          Medication Assessment Form: Reviewed. Patient indicates being compliant with therapy Treatment compliance: Compliant Risk Assessment Profile: Aberrant behavior: See prior evaluations. None observed or detected today Comorbid factors increasing risk of overdose: See prior notes. No additional risks detected today Risk of substance use disorder (SUD): Low Opioid Risk Tool (ORT) Total Score: 1  Interpretation Table:  Score <3 = Low Risk for SUD  Score between 4-7 = Moderate Risk for SUD  Score >8 = High Risk for Opioid Abuse   Risk Mitigation Strategies:  Patient Counseling: Covered Patient-Prescriber Agreement (PPA): Present and active  Notification  to other healthcare providers: Done  Pharmacologic Plan: No change in therapy, at this time  Laboratory Chemistry  Inflammation Markers No results found for: CRP, ESRSEDRATE (CRP: Acute Phase) (ESR: Chronic Phase) Renal Function Markers Lab Results  Component Value Date   BUN 11 08/21/2016   CREATININE 0.92 08/21/2016   GFRAA >60 08/21/2016   GFRNONAA >60 08/21/2016   Hepatic Function Markers Lab Results  Component Value Date   AST 23 03/04/2010   ALT 31 03/04/2010   ALBUMIN 4.7 03/04/2010   ALKPHOS 64 03/04/2010   Electrolytes Lab Results  Component Value Date   NA 135 08/21/2016   K 3.7 08/21/2016   CL 100 (L) 08/21/2016   CALCIUM 9.4 08/21/2016   Neuropathy Markers No results found for: [id number] Bone Pathology Markers Lab Results  Component Value Date   ALKPHOS 64 03/04/2010   CALCIUM 9.4 08/21/2016   Coagulation  Parameters Lab Results  Component Value Date   PLT 219 08/21/2016   Cardiovascular Markers Lab Results  Component Value Date   HGB 15.3 08/21/2016   HCT 43.6 08/21/2016   Note: Lab results reviewed.  Recent Diagnostic Imaging Review  Dg Chest 2 View  Result Date: 08/21/2016 CLINICAL DATA:  Acute onset of left-sided chest tightness. Initial encounter. EXAM: CHEST  2 VIEW COMPARISON:  None. FINDINGS: The lungs are well-aerated. Mild bibasilar atelectasis is noted. There is no evidence of pleural effusion or pneumothorax. The heart is normal in size; the mediastinal contour is within normal limits. No acute osseous abnormalities are seen. IMPRESSION: Mild bibasilar atelectasis noted.  Lungs otherwise grossly clear. Electronically Signed   By: Garald Balding M.D.   On: 08/21/2016 22:15   Note: Imaging results reviewed.          Meds  The patient has a current medication list which includes the following prescription(s): aspirin, atenolol, cinnamon, diltiazem, glucosamine-chondroitin, glyburide, lisinopril, metformin, multiple vitamins-minerals, rosuvastatin, sertraline, tizanidine, and xtampza er.  Current Outpatient Prescriptions on File Prior to Visit  Medication Sig  . aspirin 81 MG tablet Take 162 mg by mouth daily.  Marland Kitchen atenolol (TENORMIN) 100 MG tablet Take 50 mg by mouth 2 (two) times daily.  . Cinnamon 500 MG capsule Take 500 mg by mouth.  . diltiazem (DILTIAZEM CD) 240 MG 24 hr capsule Take 240 mg by mouth daily.  Marland Kitchen glucosamine-chondroitin 500-400 MG tablet Take 1 tablet by mouth once.  . glyBURIDE (DIABETA) 2.5 MG tablet Take 2.5 mg by mouth daily with breakfast.  . lisinopril (PRINIVIL,ZESTRIL) 20 MG tablet Take 20 mg by mouth daily.  . metFORMIN (GLUCOPHAGE) 500 MG tablet Take 500 mg by mouth daily with breakfast. Reported on 02/10/2016  . Multiple Vitamins-Minerals (VITAMINS TO GO MEN PO) Take 1 tablet by mouth daily.   . rosuvastatin (CRESTOR) 10 MG tablet Take 10 mg by mouth  daily.  . sertraline (ZOLOFT) 50 MG tablet Take 100 mg by mouth at bedtime.   Marland Kitchen tiZANidine (ZANAFLEX) 4 MG tablet Take 1 tablet (4 mg total) by mouth daily.   No current facility-administered medications on file prior to visit.    ROS  Constitutional: Denies any fever or chills Gastrointestinal: No reported hemesis, hematochezia, vomiting, or acute GI distress Musculoskeletal: Denies any acute onset joint swelling, redness, loss of ROM, or weakness Neurological: No reported episodes of acute onset apraxia, aphasia, dysarthria, agnosia, amnesia, paralysis, loss of coordination, or loss of consciousness  Allergies  Mr. Mustin has No Known Allergies.  [place]  Drug: Mr. Hounshell  reports that he does not use drugs. Alcohol:  reports that he does not drink alcohol. Tobacco:  reports that he quit smoking about 22 years ago. He has quit using smokeless tobacco. Medical:  has a past medical history of CHF (congestive heart failure) ([place]); Depression; Diabetes mellitus without complication ([place]); GERD (gastroesophageal reflux disease); Hyperlipidemia; and Hypertension. Family: family history includes Alcohol abuse in his brother and father; Arthritis in his maternal grandfather and mother; Asthma in his daughter; Cancer in his maternal grandfather and mother; Depression in his brother; Diabetes in his brother, maternal grandfather, and mother; Drug abuse in his daughter; Early death in his brother; Heart disease in his brother and father; Hypertension in his brother and father; Kidney disease in his brother; Mental illness in his maternal aunt; Stroke in his maternal grandmother; Varicose Veins in his mother.  Past Surgical History:  Procedure Laterality Date  . APPENDECTOMY    . CORONARY ANGIOPLASTY WITH STENT PLACEMENT  1996 /2003   Constitutional Exam  General appearance: Well nourished, well developed, and well hydrated. In no apparent acute distress Vitals:   12/16/16 1321  BP: (!) 161/97   Pulse: (!) 55  Resp: 18  Temp: 98 F (36.7 C)  SpO2: 97%  Weight: 220 lb (99.8 kg)  Height: '5\' 10"'  (1.778 m)   BMI Assessment: Estimated body mass index is 31.57 kg/m as calculated from the following:   Height as of this encounter: '5\' 10"'  (1.778 m).   Weight as of this encounter: 220 lb (99.8 kg).  BMI interpretation table: BMI level Category Range association with higher incidence of chronic pain  <18 kg/m2 Underweight   18.5-24.9 kg/m2 Ideal body weight   25-29.9 kg/m2 Overweight Increased incidence by 20%  30-34.9 kg/m2 Obese (Class I) Increased incidence by 68%  35-39.9 kg/m2 Severe obesity (Class II) Increased incidence by 136%  >40 kg/m2 Extreme obesity (Class III) Increased incidence by 254%   BMI Readings from Last 4 Encounters:  12/16/16 31.57 kg/m  10/20/16 33.00 kg/m  08/21/16 33.72 kg/m  08/20/16 33.72 kg/m   Wt Readings from Last 4 Encounters:  12/16/16 220 lb (99.8 kg)  10/20/16 230 lb (104.3 kg)  08/21/16 235 lb (106.6 kg)  08/20/16 235 lb (106.6 kg)  Psych/Mental status: Alert, oriented x 3 (person, place, & time)       Eyes: PERLA Respiratory: No evidence of acute respiratory distress  Lumbar Spine Exam  Inspection: No masses, redness, or swelling, back brace patent Alignment: Symmetrical Functional ROM: Unrestricted ROM      Stability: No instability detected Muscle strength & Tone: Functionally intact Sensory: Unimpaired Palpation: No palpable anomalies       Provocative Tests: Lumbar Hyperextension and rotation test: Positive on the left for facet joint pain. Patrick's Maneuver: evaluation deferred today                    Gait & Posture Assessment  Ambulation: Unassisted Gait: Relatively normal for age and body habitus Posture: WNL   Lower Extremity Exam    Side: Right lower extremity  Side: Left lower extremity  Inspection: No masses, redness, swelling, or asymmetry. No contractures  Inspection: No masses, redness, swelling, or  asymmetry. No contractures  Functional ROM: Unrestricted ROM          Functional ROM: Unrestricted ROM          Muscle strength & Tone: Functionally intact  Muscle strength & Tone: Functionally intact  Sensory: Unimpaired  Sensory: Unimpaired  Palpation:  No palpable anomalies  Palpation: No palpable anomalies   Assessment  Primary Diagnosis & Pertinent Problem List: The primary encounter diagnosis was DDD (degenerative disc disease), lumbar. Diagnoses of Musculoskeletal pain, Chronic pain syndrome, and Long term current use of opiate analgesic were also pertinent to this visit.  Status Diagnosis  Controlled Controlled Controlled 1. DDD (degenerative disc disease), lumbar   2. Musculoskeletal pain   3. Chronic pain syndrome   4. Long term current use of opiate analgesic     Problems updated and reviewed during this visit: Problem  Ddd (Degenerative Disc Disease), Lumbar  Chronic Pain  Chronic Low Back Pain  Chronic Pain of Lower Extremity  Neurogenic Pain  Long Term Current Use of Opiate Analgesic  Long Term Prescription Opiate Use  Opiate Use  Encounter for Therapeutic Drug Level Monitoring  Musculoskeletal Pain  Arteriosclerosis of Coronary Artery   Overview:  A.  12/20/2009 Xience drug eluting stent placed to a 90% proximal LAD lesion.   Type 2 Diabetes Mellitus ([facility])   Overview:  Type 2 diabetes, on orals.   Hld (Hyperlipidemia)  Bp (High Blood Pressure)  DM   Qualifier: Diagnosis of  By: Loanne Drilling MD, Hilliard Clark A    GOUT   Qualifier: Diagnosis of  By: Loanne Drilling MD, Sean A    DEPRESSION   Qualifier: Diagnosis of  By: Loanne Drilling MD, Jacelyn Pi    CAD   Qualifier: Diagnosis of  By: Loanne Drilling MD, Jacelyn Pi     Plan of Care  Pharmacotherapy (Medications Ordered): Meds ordered this encounter  Medications  . DISCONTD: XTAMPZA ER 36 MG C12A    Sig: Take 2 capsules by mouth 2 (two) times daily.    Dispense:  14 each    Refill:  0    Do not fill until 12/24/16    Order  Specific Question:   Supervising Provider    Answer:   Molli Barrows [[id number]]  . DISCONTD: XTAMPZA ER 36 MG C12A    Sig: Take 2 capsules by mouth 2 (two) times daily.    Dispense:  120 each    Refill:  0    Do not fill until 12/24/16    Order Specific Question:   Supervising Provider    Answer:   Molli Barrows [[id number]]  . XTAMPZA ER 36 MG C12A    Sig: Take 2 capsules by mouth 2 (two) times daily.    Dispense:  120 each    Refill:  0    Do not fill until 01/23/17    Order Specific Question:   Supervising Provider    Answer:   Molli Barrows [[id number]]   New Prescriptions   No medications on file   Medications administered today: Mr. Dreibelbis had no medications administered during this visit. Lab-work, procedure(s), and/or referral(s): Orders Placed This Encounter  Procedures  . ToxASSURE Select 13 (MW), Urine   Imaging and/or referral(s): None  Interventional therapies: Planned, scheduled, and/or pending:   Not at this time.   Considering:   Left sided Lumbar Facet Injection    Palliative PRN treatment(s):   Not at this time   Provider-requested follow-up: Return in about 2 months (around 02/15/2017) for Medication Mgmt.  No future appointments. Primary Care Physician: Maryland Pink, MD Location: [facility] Out[facility] Management Facility Note by: Vevelyn Francois NP Date: 12/16/2016; Time: 2:19 PM  Pain Score Disclaimer: We use the NRS-11 scale. This is a self-reported, subjective measurement of pain severity with only modest accuracy. It  is used primarily to identify changes within a particular patient. It must be understood that outpatient pain scales are significantly less accurate that those used for research, where they can be applied under ideal controlled circumstances with minimal exposure to variables. In reality, the score is likely to be a combination of pain intensity and pain affect, where pain affect describes the degree of emotional arousal or changes in action  readiness caused by the sensory experience of pain. Factors such as social and work situation, setting, emotional state, anxiety levels, expectation, and prior pain experience may influence pain perception and show large inter-individual differences that may also be affected by time variables.  Patient instructions provided during this appointment: Patient Instructions  ____________________________________________________________________________________________  Medication Rules  Applies to: All patients receiving prescriptions (written or electronic).  Pharmacy of record: Pharmacy where electronic prescriptions will be sent. If written prescriptions are taken to a different pharmacy, please inform the nursing staff. The pharmacy listed in the electronic medical record should be the one where you would like electronic prescriptions to be sent.  Prescription refills: Only during scheduled appointments. Applies to both, written and electronic prescriptions.  NOTE: The following applies primarily to controlled substances (Opioid Pain Medications)  Patient's responsibilities: 1. Pain Pills: Bring all pain pills to every appointment (except for procedure appointments). 2. Pill Bottles: Bring pills in original pharmacy bottle. Always bring newest bottle. Bring bottle, even if empty. 3. Medication refills: You are responsible for knowing and keeping track of what medications you need refilled. The day before your appointment, write a list of all prescriptions that need to be refilled. Bring that list to your appointment and give it to the admitting nurse. Prescriptions will be written only during appointments. If you forget a medication, it will not be "Called in", "Faxed", or "electronically sent". You will need to get another appointment to get these prescribed. 4. Prescription Accuracy: You are responsible for carefully inspecting your prescriptions before leaving our office. Have the discharge nurse  carefully go over each prescription with you, before taking them home. Make sure that your name is accurately spelled, that your address is correct. Check the name and dose of your medication to make sure it is accurate. Check the number of pills, and the written instructions to make sure they are clear and accurate. Make sure that you are given enough medication to last until your next medication refill appointment. 5. Taking Medication: Take medication as prescribed. Never take more pills than instructed. Never take medication more frequently than prescribed. Taking less pills or less frequently is permitted and encouraged, when it comes to controlled substances (written prescriptions).  6. Inform other Doctors: Always inform, all of your healthcare providers, of all the medications you take. 7. Pain Medication from other Providers: You are not allowed to accept any additional pain medication from any other Doctor or Healthcare provider. There are two exceptions to this rule. (see below) In the event that you require additional pain medication, you are responsible for notifying us, as stated below. 8. Medication Agreement: You are responsible for carefully reading and following our Medication Agreement. This must be signed before receiving any prescriptions from our practice. Safely store a copy of your signed Agreement. Violations to the Agreement will result in no further prescriptions. (Additional copies of our Medication Agreement are available upon request.) 9. Laws, Rules, & Regulations: All patients are expected to follow all Federal and [employer], Rules, [employer]. Ignorance of the Laws does not constitute  a valid excuse.  Exceptions: There are only two exceptions to the rule of not receiving pain medications from other Healthcare Providers. 1. Exception #1 (Emergencies): In the event of an emergency (i.e.: accident requiring emergency care), you are allowed to receive additional  pain medication. However, you are responsible for: As soon as you are able, call our office [phone], at any time of the day or night, and leave a message stating your name, the date and nature of the emergency, and the name and dose of the medication prescribed. In the event that your call is answered by a member of our staff, make sure to document and save the date, time, and the name of the person that took your information.  2. Exception #2 (Planned Surgery): In the event that you are scheduled by another doctor or dentist to have any type of surgery or procedure, you are allowed (for a period no longer than 30 days), to receive additional pain medication, for the acute post-op pain. However, in this case, you are responsible for picking up a copy of our "Post-op Pain Management for Surgeons" handout, and giving it to your surgeon or dentist. This document is available at our office, and does not require an appointment to obtain it. Simply go to our office during business hours (Monday-Thursday from 8:00 AM to 4:00 PM) (Friday 8:00 AM to 12:00 Noon) or if you have a scheduled appointment with [place], prior to your surgery, and ask for it by name. In addition, you will need to provide [place] with your name, name of your surgeon, type of surgery, and date of procedure or surgery.  _____________________________________________________________________________________________

## 2016-12-16 NOTE — Progress Notes (Deleted)
Patient's Name: Peter Lamb  MRN: [id number]  Referring Provider: Maryland Pink, MD  DOB: Jun 12, 1956  PCP: Maryland Pink, MD  DOS: 12/16/2016  Note by: Vevelyn Francois NP  Service setting: Ambulatory outpatient  Specialty: Interventional Pain Management  Location: [facility] (AMB) Pain Management Facility    Patient type: Established    _0 @ _1 @ Controlled Substance Pharmacotherapy Assessment REMS (Risk Evaluation and Mitigation Strategy)  Analgesic: *** MME/day: *** mg/day.  No notes on file Pharmacokinetics: Liberation and absorption (onset of action): {Blank single:19197::"Shorter than expected.","Longer than expected","WNL"} Distribution (time to peak effect): {Blank single:19197::"Shorter than expected.","Longer than expected","WNL"} Metabolism and excretion (duration of action): {Blank single:19197::"Shorter than expected.","Longer than expected.","WNL"} {Blank single:19197::"This would suggest rapid metabolism and/or elimination","This would suggest impaired metabolism and/or elimination","WNL","       "} Pharmacodynamics: Desired effects: Analgesia: Mr. Guercio reports {Blank single:19197::"no","<50%","50%",">50%"} benefit. Functional ability: Patient reports {Blank single:19197::"being unable to accomplish basic ADLs","that medication does help, but not nearly as much as he would like","that medication allows him to have a normal productive life, including accomplishing all ADLs","that medication allows him to accomplish basic ADLs"} Clinically meaningful improvement in function (CMIF): {Blank single:19197::"Medication does not meet basic CMIF","Sustained CMIF goals met"} Perceived effectiveness: {Blank single:19197::"None","Described as ineffective and would like to make some changes","Described as relatively effective but with some room for improvement","Described as relatively effective, allowing for increase in activities of daily living  (ADL)"} Undesirable effects: Side-effects or Adverse reactions: {Blank single:19197::"Minimal","Moderate","Significant","Constipation","Oversedation. This could suggest a drug interaction or accumulation","None reported"} Monitoring: [place] PMP: {Blank single:19197::"Unable to conduct review of the controlled substance reporting system due to technological failure.","Online review of the past 30-monthperiod conducted.","Online review of the past 145-montheriod conducted."} {Blank siMVEHMC:94709::"GGEpplicable","Non-compliant","Extensive, fairly regular use of opioid analgesics identified","Unreported sources of opioid analgesics detected","Abnormal. Results discussed with patient","Discrepancies found between information provided by the patient and the database","Compliant with practice rules and regulations"} List of all UDS test(s) done:  Lab Results  Component Value Date   TOEmhouseINAL 08/20/2016   TO[place]om[place]INAL 12/26/2015   TO[place]enhur[place]INAL 09/05/2015   Last UDS on record: ToxAssure Select 13  Date Value Ref Range Status  08/20/2016 FINAL  Final    Comment:    ==================================================================== TOXASSURE SELECT 13 (MW) ==================================================================== Specimen Alert Note:  Urinary creatinine is low; ability to detect some drugs may be compromised.  Interpret results with caution. ==================================================================== Test                             Result       Flag       Units Drug Present and Declared for Prescription Verification   Oxycodone                      3733         EXPECTED   ng/mg creat   Oxymorphone                    1522         EXPECTED   ng/mg creat   Noroxycodone                   8506         EXPECTED   ng/mg creat   Noroxymorphone                 750          EXPECTED  ng/mg creat    Sources of oxycodone are scheduled prescription medications.     Oxymorphone, noroxycodone, and noroxymorphone are expected    metabolites of oxycodone. Oxymorphone is also available as a    scheduled prescription medication. ==================================================================== Test                      Result    Flag   Units      Ref Range   Creatinine              18        L      mg/dL      >=20 ==================================================================== Declared Medications:  The flagging and interpretation on this report are based on the  following declared medications.  Unexpected results may arise from  inaccuracies in the declared medications.  **Note: The testing scope of this panel includes these medications:  Oxycodone  **Note: The testing scope of this panel does not include following  reported medications:  Aspirin (Aspirin 81)  Atenolol  Chondroitin (Glucosamine-Chondroitin)  Diltiazem (Cardizem CD)  Glucosamine (Glucosamine-Chondroitin)  Glyburide  Lisinopril  Metformin  Multivitamin  Pioglitazone (Actos)  Rosuvastatin (Crestor)  Sertraline (Zoloft)  Sitagliptin (Januvia)  Supplement  Tizanidine (Zanaflex) ==================================================================== For clinical consultation, please call [phone]. ====================================================================    UDS interpretation: {Blank UTMLYY:50354::"SFK-CLEXNTZGY","FVC applicable","No unexpected findings","Unexpected findings not considered significantly abnormal","Unexpected findings:","Compliant"} {Blank BSWHQP:59163::"WGYKZLDJTT illicit substance detected","The patient was given a final warning about the accuracy of reporting medications.","In this case, absence of medication may be secondary to sample timing with relation to PRN intake.","Presence of the parent compound in the absence of its metabolites could be due to very rare metabolic variances, vs deliberate urine sample tampering.","Absence of the parent  compound in the presence of its metabolites could be due to lapse of time since the last dose or unusual pharmacokinetics (Rapid Metabolism).","Patient informed of the CDC guidelines and recommendations to stay away from the concomitant use of benzodiazepines and opioids due to the increased risk of respiratory depression and death.","This may be an issue with the scheduling as opposed to taking more medication than prescribed. We will make an effort to schedule the return appointment before the patient runs out of medication.","        "} Medication Assessment Form: {Blank SVXBLT:90300::"PQZ applicable. Initial evaluation. The patient has not received any medications from our practice","Discrepancies found between patient's report and information collected","Reviewed. Abnormalities discussed","Reviewed. Patient indicates being compliant with therapy"} Treatment compliance: {Blank RAQTMA:26333::"LKT applicable. Initial evaluation","Non-compliant","Deficiencies noted and steps taken to remind the patient of the seriousness of adequate therapy compliance","Non-compliant. Steps taken to remind the patient of the seriousness of adequate therapy compliance","Recurrent non-compliance, despite repeated warnings","Compliant"} Risk Assessment Profile: Aberrant behavior: {Aberrant Behavior:210120800::"See prior evaluations. None observed or detected today"} Comorbid factors increasing risk of overdose: {Risk Factors:210120801::"See prior notes. No additional risks detected today"} Risk of substance use disorder (SUD): {Blank single:19197::"Very High","High-to-Very High","High","Moderate-to-High","Moderate","Low-to-Moderate","Low"} Opioid Risk Tool (ORT) Total Score:    Interpretation Table:  Score <3 = Low Risk for SUD  Score between 4-7 = Moderate Risk for SUD  Score >8 = High Risk for Opioid Abuse   Risk Mitigation Strategies:  Patient Counseling: {Blank single:19197::"Completed today. Counseling provided to  patient as per "Patient Counseling Document". Document signed by patient, attesting to counseling and understanding","Covered"} Patient-Prescriber Agreement (PPA): {Blank single:19197::"Medication agreement broken by patient","Obtained today","Present and active"}  Notification to other healthcare providers: {Blank single:19197::"N/A. Opioid therapy discontinued","Written and sent today","Done"}  Pharmacologic Plan: {Blank single:19197::"Pending  ordered tests and/or consults","Today we will take over the chronic pain medication management and from this point on our medication agreement with this patient is active","Therapy adjustment:",""Drug Holiday"","Opioid therapy discontinued","Treatment plan will be modified to exclude opioids","For safety reasons, the treatment plan will be modified to exclude opioids","Mr. Summons is not a candidate for opioid therapy at this time","No change in therapy, at this time"}  Post-Procedure Assessment  Visit date not found Procedure: *** Pre-procedure pain score:  {Blank single:19197::"0","1","2","3","4","5","6","7","8","9","10","      "}/10 Post-procedure pain score: {Blank single:19197::"1","2","3","4","5","0"}/10 {Blank single:19197::"(100% relief)","(More than 50% relief)","No relief","       "} Influential Factors: BMI:   Intra-procedural challenges: {Blank single:19197::"Increased level of difficulty due to lack of cooperation on the part of Mr. Lovel, [place] level of difficulty due to Mr. Rafanan anatomy","Increased level of difficulty due to Mr. Fellman obesity  ","Increased level of difficulty due to Mr. Fatzinger size","None observed"} Assessment challenges: {Blank single:19197::"Inadequate record keeping by Mr. Kendrik, Mcshan. Ishida did not keep his scheduled follow-up appointment","Possible secondary gain issues suspected","Results reported today are inconsistent with those reported on procedure day, immediately before discharge.","None  detected"} {Blank single:19197::"Previously the patient had reported 100% relief of the pain, before leaving the facility","Lack of understanding of post-procedural reporting, despite initial explanations","       "} Post-procedural side-effects, adverse reactions, or complications: {Blank PVXYIA:16553::"ZSM onset of movement-associated discomfort","New onset impaired sensorium","New onset weakness","Bleeding","Infection","Nerve damage","Allergic reaction","Transient post-procedural increase in pain","None reported"} Reported issues: {Blank single:19197::"Transient post-op increase in pain","Persistent worsening of the pain","No benefits","No significant issues reported","None"}  Sedation: {Blank single:19197::"Sedation provided.","No sedation used.","Please see nurses note."} When no sedatives are used, the analgesic levels obtained are directly associated to the effectiveness of the local anesthetics. However, when sedation is provided, the level of analgesia obtained during the initial 1 hour following the intervention, is believed to be the result of a combination of factors. These factors may include, but are not limited to: 1. The effectiveness of the local anesthetics used. 2. The effects of the analgesic(s) and/or anxiolytic(s) used. 3. The degree of discomfort experienced by the patient at the time of the procedure. 4. The patients ability and reliability in recalling and recording the events. 5. The presence and influence of possible secondary gains and/or psychosocial factors. Reported result: Relief experienced during the 1st hour after the procedure:   (Ultra-Short Term Relief) Interpretative annotation: {Blank single:19197::"Inaccurate and unreliable report.","Poor patient recollection.","Prolonged period between intervention and post-procedure assessment, possibly leading to poor recollection and inaccurate reporting.","Patient did not keep pain diary.","Patient reporting pain in area not  involved in diagnostic test.","No Analgesic administered (i.e.: IV Fentanyl).","No Anxiolytic administered (i.e.: IV Midazolam).","No Analgesic or Anxiolytic given, therefore benefits are completely due to Local Anesthetics.","No relief despite the use of intravenous benzodiazepines and/or opioids would suggest the pain to be unresponsive to these class of drugs. Therefore, the long-term therapeutic use of these pharmacological agents may need to be reconsidered.","Unexpected non-physiological response.","No relief from the local anesthetics would suggest pain etiology to reside elsewhere.","Partial relief from local anesthetics would suggest that the injected area is not 100% responsible for the patient's symptoms.","Analgesia during this period is likely to be Local Anesthetic and/or IV Sedative (Analgesic/Anxiolitic) related."} {Blank single:19197::"Patient does not appear to have understood instructions on differential evaluation of treated vs untreated area, leading to an inaccurate global report","       "}  Effects of local anesthetic: The analgesic effects attained during this period are directly associated to the localized infiltration of local anesthetics and therefore cary significant diagnostic value  as to the etiological location, or anatomical origin, of the pain. Expected duration of relief is directly dependent on the pharmacodynamics of the local anesthetic used. {Blank single:19197::"No local anesthetics used.","Short-acting (1hour) anesthetics used.","Long-acting (4-6 hours) anesthetics used."}  Reported result: Relief during the next 4 to 6 hour after the procedure:   (Short-Term Relief) Interpretative annotation: {Blank single:19197::"Inaccurate and unreliable report.","Patient did not keep pain diary.","Patient admits to poor recollection.","Unexpected non-physiological response.","Patient reporting pain in area not involved in diagnostic test.","No analgesic effect would suggest pain  etiology to reside elsewhere.","Partial relief would suggest incomplete involvement of injected area.","Complete relief would suggest area to be the source of the pain."} {Blank single:19197::"Patient does not appear to have understood instructions on differential evaluation of treated vs untreated area, leading to an inaccurate global report","       "}  Long-term benefit: Defined as the period of time past the expected duration of local anesthetics. With the possible exception of prolonged sympathetic blockade from the local anesthetics, benefits during this period are typically attributed to, or associated with, other factors such as analgesic sensory neuropraxia, antiinflammatory effects, or beneficial biochemical changes provided by agents other than the local anesthetics Reported result: Extended relief following procedure:   (Long-Term Relief) Interpretative annotation: {Blank single:19197::"Patient admits to poor recollection.","No benefit. Unexpected therapeutic failure.","No long-term benefit expected. Mr. Stepanek requested to have no steroids injected","No long-term benefit. This could suggest limited inflammatory component to the pain with possible mechanical aggravating factors.","No benefit. This could suggest algesic mechanism to be mechanical rather than inflammatory.","Partial relief. Possible incomplete therapeutic success.","Partial relief. This could suggest the algesic mechanism to be a combination of tissue inflammation and mechanical problems.","Good relief. Possible therapeutic success.","Good relief. This could suggest inflammation to be a significant component in the etiology to the pain."} {Blank single:19197::"Relief is believed to be associated to the steroid","Benefit could suggest adequate neurolysis","Benefit could signal adequate RF ablation","No benefit, or incomplete benefit may suggest therapeutic failure","No benefit, or incomplete benefit may suggest failure to adequately  neurolyse the nerve","       "}  Current benefits: Defined as persistent relief that continues at this point in time.   Reported results: Treated area: {Blank single:19197::"0","<25","25","<50","50",">50","<75","75","90","100","***"} % {Blank single:19197::"Mr. Mogan reports improvement in function","     "} Interpretative annotation: {Blank single:19197::"No benefit whatsoever. This would argue against repeating therapy","No long-term benefit expected as the patient requested that no steroids be injected","Recurrance of symptoms. This would suggest persistent aggravating factors","Partial benefit. This would suggest further treatment needed","Long-term benefit would suggest adequate anti-inflammatory effects","Long-term benefit would suggest adequate RF ablation","Long-term benefit would suggest adequate neurolysis","Ongoing benefits would suggest effective palliative intervention","Ongoing benefits would suggest effective therapeutic approach"}  Interpretation: Results would suggest {Blank single:19197::"failure of therapy in achieving desired goal(s).","that repeating the procedure may be necessary,","adequate radiofrequency ablation.","this therapy to be effective in the management of Mr. Mcparland condition.","Mr. Boettner to be a good candidate for Radiofrequency Ablation.","Mr. Gehret to be a good candidate for a RACZ Procedure.","therapy to have a positive impact on the patient's condition.","area to be involved, however, further evaluation and testing may be required.","non-involvement of the tested area.","a neuropathy associated with permanent nerve damage, persistent neural entrapment, or mechanical compression/impingement, as opposed to an inflammatory-mediated neuropraxia","a successful intervention.","a successful therapeutic intervention.","a successful palliative intervention.","a successful diagnostic intervention."} {Blank single:19197::"for diagnostic reasons","for therapeutic  reasons","Possible adjustment in plan of care may be required","We'll proceed with diagnostic intervention #2, as soon as convenient","We'll proceed with the next treatment, as soon as convenient","The patient has failed to respond to conservative therapies  including over-the-counter medications, anti-inflammatories, muscle relaxants, membrane stabilizers, opioids, physical therapy, modalities such as heat and ice, as well as more invasive techniques such as nerve blocks. Because Mr. Griffing did attain more than 50% relief of the pain during a series of diagnostic blocks conducted in separate occasions, I believe it is medically necessary to proceed with Radiofrequency Ablation, in order to attempt gaining longer relief.","       "}  Laboratory Chemistry  Inflammation Markers No results found for: CRP, ESRSEDRATE (CRP: Acute Phase) (ESR: Chronic Phase) Renal Function Markers Lab Results  Component Value Date   BUN 11 08/21/2016   CREATININE 0.92 08/21/2016   GFRAA >60 08/21/2016   GFRNONAA >60 08/21/2016   Hepatic Function Markers Lab Results  Component Value Date   AST 23 03/04/2010   ALT 31 03/04/2010   ALBUMIN 4.7 03/04/2010   ALKPHOS 64 03/04/2010   Electrolytes Lab Results  Component Value Date   NA 135 08/21/2016   K 3.7 08/21/2016   CL 100 (L) 08/21/2016   CALCIUM 9.4 08/21/2016   Neuropathy Markers No results found for: [id number] Bone Pathology Markers Lab Results  Component Value Date   ALKPHOS 64 03/04/2010   CALCIUM 9.4 08/21/2016   Coagulation Parameters Lab Results  Component Value Date   PLT 219 08/21/2016   Cardiovascular Markers Lab Results  Component Value Date   HGB 15.3 08/21/2016   HCT 43.6 08/21/2016   Note: {Blank single:19197::"No results found under the [employer] electronic medical record","Results made available to patient.","Lab results reviewed and made available to patient.","Lab results reviewed and explained to patient in Layman's  terms.","Lab results reviewed."}  Recent Diagnostic Imaging Review  Dg Chest 2 View  Result Date: 08/21/2016 CLINICAL DATA:  Acute onset of left-sided chest tightness. Initial encounter. EXAM: CHEST  2 VIEW COMPARISON:  None. FINDINGS: The lungs are well-aerated. Mild bibasilar atelectasis is noted. There is no evidence of pleural effusion or pneumothorax. The heart is normal in size; the mediastinal contour is within normal limits. No acute osseous abnormalities are seen. IMPRESSION: Mild bibasilar atelectasis noted.  Lungs otherwise grossly clear. Electronically Signed   By: Garald Balding M.D.   On: 08/21/2016 22:15   Note: {Blank single:19197::"No new results found.","No results found under the [employer] electronic medical record.","Imaging results reviewed and explained to patient in Layman's terms.","Results of ordered imaging test(s) reviewed and explained to patient in Layman's terms.","Imaging results reviewed."} {Blank single:19197::"Results made available to patient","Copy of results provided to patient","       "}  Meds  The patient has a current medication list which includes the following prescription(s): aspirin, atenolol, cinnamon, diltiazem, glucosamine-chondroitin, glyburide, lisinopril, metformin, multiple vitamins-minerals, naloxone, oxycodone, pioglitazone, rosuvastatin, sertraline, sitagliptin, sitagliptin, and tizanidine.  Current Outpatient Prescriptions on File Prior to Visit  Medication Sig  . aspirin 81 MG tablet Take 162 mg by mouth daily.  Marland Kitchen atenolol (TENORMIN) 100 MG tablet Take 50 mg by mouth 2 (two) times daily.  . Cinnamon 500 MG capsule Take 500 mg by mouth.  . diltiazem (DILTIAZEM CD) 240 MG 24 hr capsule Take 240 mg by mouth daily.  Marland Kitchen glucosamine-chondroitin 500-400 MG tablet Take 1 tablet by mouth once.  . glyBURIDE (DIABETA) 2.5 MG tablet Take 2.5 mg by mouth daily with breakfast.  . lisinopril (PRINIVIL,ZESTRIL) 20 MG tablet Take 20 mg by mouth daily.   . metFORMIN (GLUCOPHAGE) 500 MG tablet Take 500 mg by mouth daily with breakfast. Reported on 02/10/2016  . Multiple Vitamins-Minerals (VITAMINS  TO GO MEN PO) Take 1 tablet by mouth daily.   . naloxone (NARCAN) nasal spray 4 mg/0.1 mL For opioid overdose  . oxyCODONE (OXYCONTIN) 60 MG 12 hr tablet Take 1 tablet by mouth 3 (three) times daily. (Patient not taking: Reported on 10/20/2016)  . pioglitazone (ACTOS) 30 MG tablet Take 30 mg by mouth daily.  . rosuvastatin (CRESTOR) 10 MG tablet Take 10 mg by mouth daily.  . sertraline (ZOLOFT) 50 MG tablet Take 50 mg by mouth at bedtime.  . sitaGLIPtin (JANUVIA) 100 MG tablet Take 100 mg by mouth daily.  . sitaGLIPtin (JANUVIA) 100 MG tablet Take 100 mg by mouth daily.  Marland Kitchen tiZANidine (ZANAFLEX) 4 MG tablet Take 1 tablet (4 mg total) by mouth daily.   No current facility-administered medications on file prior to visit.    ROS  Constitutional: {Blank single:19197::"Denies any fever or chills"} Gastrointestinal: {Blank single:19197::"No reported hemesis, hematochezia, vomiting, or acute GI distress"} Musculoskeletal: {Blank single:19197::"Denies any acute onset joint swelling, redness, loss of ROM, or weakness"} Neurological: {Blank single:19197::"No reported episodes of acute onset apraxia, aphasia, dysarthria, agnosia, amnesia, paralysis, loss of coordination, or loss of consciousness"}  Allergies  Mr. Rotert has No Known Allergies.  [place]  Drug: Mr. Frye  reports that he does not use drugs. Alcohol:  reports that he does not drink alcohol. Tobacco:  reports that he quit smoking about 22 years ago. He has quit using smokeless tobacco. Medical:  has a past medical history of CHF (congestive heart failure) ([place]); Depression; Diabetes mellitus without complication ([place]); GERD (gastroesophageal reflux disease); Hyperlipidemia; and Hypertension. Family: family history includes Alcohol abuse in his brother and father; Arthritis in his maternal  grandfather and mother; Asthma in his daughter; Cancer in his maternal grandfather and mother; Depression in his brother; Diabetes in his brother, maternal grandfather, and mother; Drug abuse in his daughter; Early death in his brother; Heart disease in his brother and father; Hypertension in his brother and father; Kidney disease in his brother; Mental illness in his maternal aunt; Stroke in his maternal grandmother; Varicose Veins in his mother.  Past Surgical History:  Procedure Laterality Date  . APPENDECTOMY    . CORONARY ANGIOPLASTY WITH STENT PLACEMENT  1996 /2003   Constitutional Exam  General appearance: {general exam:210120802::"Well nourished, well developed, and well hydrated. In no apparent acute distress"} There were no vitals filed for this visit. BMI Assessment: Estimated body mass index is 33 kg/m as calculated from the following:   Height as of 10/20/16: 5' 10" (1.778 m).   Weight as of 10/20/16: 230 lb (104.3 kg).  BMI interpretation table: BMI level Category Range association with higher incidence of chronic pain  <18 kg/m2 Underweight   18.5-24.9 kg/m2 Ideal body weight   25-29.9 kg/m2 Overweight Increased incidence by 20%  30-34.9 kg/m2 Obese (Class I) Increased incidence by 68%  35-39.9 kg/m2 Severe obesity (Class II) Increased incidence by 136%  >40 kg/m2 Extreme obesity (Class III) Increased incidence by 254%   BMI Readings from Last 4 Encounters:  10/20/16 33.00 kg/m  08/21/16 33.72 kg/m  08/20/16 33.72 kg/m  05/20/16 32.28 kg/m   Wt Readings from Last 4 Encounters:  10/20/16 230 lb (104.3 kg)  08/21/16 235 lb (106.6 kg)  08/20/16 235 lb (106.6 kg)  05/20/16 225 lb (102.1 kg)  Psych/Mental status: {Blank single:19197::"Alert and oriented x 3. Exaggerated physical and/or psychosocial pain behavior perceived.","Alert, oriented x 3 (person, place, & time)"} {Blank single:19197::"Mr. Moltz's speech pattern and demeanor seems to suggest  oversedation","      "} Eyes: {Blank single:19197::"Miotic (pupilary constriction) due to opiate use","Midriatic","Anisocoric","Evidence of ptosis","Pin-point pupils","PERLA"} Respiratory: {Blank single:19197::"Oxygen-dependent COPD","No evidence of acute respiratory distress"}  Cervical Spine Exam  Inspection: {Blank single:19197::"Well healed scar from previous spine surgery detected","Paravertebral muscle atrophy","No masses, redness, or swelling"} Alignment: {Blank single:19197::"Asymmetric","Symmetrical"} Functional ROM: {Blank single:19197::"Improved after treatment","Adequate ROM","Decreased ROM","Diminished ROM","Full ROM","Fused","Grossly intact ROM","Guarding","Limited ROM","Mechanically restricted ROM","Minimal ROM","Pain restricted ROM","Restricted ROM","Zero ROM","ROM appears unrestricted","ROM is within functional limits (WFL)","ROM is within normal limits (WNL)","Unrestricted ROM"}{Blank single:19197::", to the right",", to the left",", bilaterally","     "} Stability: {Blank single:19197::"Possibly unstable","No instability detected"} Muscle strength & Tone: {Blank single:19197::"Inconsistent level of performance when tested","Guarding observed","Functionally intact"} Sensory: {Blank single:19197::"Improved","Movement-associated pain","Movement-associated discomfort","Impaired sensorium","Articular pain pattern","Arthropathic arthralgia","Dermatomal pain pattern","Musculoskeletal pain pattern","Myotome pain pattern","Neurogenic pain pattern","Neuropathic pain pattern","Referred pain pattern","Visceral pain pattern","Allodynia (Painful response to non-painful stimuli)","Anesthesia (Absence of sensation)","Anesthesia Dolorosa (Numbness over painful area)","Dysesthesias (Unpleasant sensation to touch)","Hyperalgesia (Increased sensitivity to pain)","Hyperesthesia (Increased sensitivity to touch)","Hyperpathia (Painful, exaggerated response to nociceptive stimuli)","Hypoalgesia (Decreased sensitivity to painful  stimuli)","Hypoesthesia/Hypesthesia (Reduced sensation to touch)","Paresthesia (Burning sensation)","Paresthesia (Tingling sensation)","WNL","No anomaly detected","Unimpaired"} Palpation: {Blank single:19197::"Tender","Complains of area being tender to palpation","Uncomfortable","Positive","Negative","Increased muscle tone","Trigger Point","Muscular Atrophy","Non-tender","No complaints of tenderness","Hyperthermic","Hypothermic","Euthermic","No palpable anomalies"} {Blank single:19197::"Cervical compression test","Tinel's test","Trousseau's test (3 minute blood pressure cuff test) for hypocalcemia","     "} {Blank single:19197::"for right occipital neuralgia","for left occipital neuralgia","for bilateral occipital neuralgia","for cervical facet disease","     "}  Upper Extremity (UE) Exam    Side: Right upper extremity  Side: Left upper extremity  Inspection: {Blank single:19197::"Below elbow amputation (BEA)","Above elbow amputation (AEA)","Contracture","Atrophy","Dystrophy","Degenerative arthropathy with ulnar deviation","Degenerative deforming arthropathy","Heberden's nodes (DIP)","Bouchard's nodes (PIP)","No gross anomalies detected","Edema","Positive color changes","Some redness observed","Increased temperature","Acrocyanosis","Normal skin color, temperature, and hair growth. No peripheral edema or cyanosis","No masses, redness, swelling, or asymmetry. No contractures"}  Inspection: {Blank single:19197::"Below elbow amputation (BEA)","Above elbow amputation (AEA)","Contracture","Atrophy","Dystrophy","Degenerative arthropathy with ulnar deviation","Degenerative deforming arthropathy","Heberden's nodes (DIP)","Bouchard's nodes (PIP)","No gross anomalies detected","Edema","Positive color changes","Some redness observed","Increased temperature","Acrocyanosis","Normal skin color, temperature, and hair growth. No peripheral edema or cyanosis","No masses, redness, swelling, or asymmetry. No contractures"}   Functional ROM: {Blank single:19197::"Improved after treatment","Impaired ROM","Adequate ROM","Decreased ROM","Diminished ROM","Full ROM","Fused","Grossly intact ROM","Guarding","Limited ROM","Mechanically restricted ROM","Minimal ROM","Pain restricted ROM","Restricted ROM","Zero ROM","ROM appears unrestricted","ROM is within functional limits (WFL)","ROM is within normal limits (WNL)","Unrestricted ROM"} {Blank single:19197::"for shoulder","for elbow","for shoulder and elbow","for wrist","for wrist and hand","for hand","for all joints of upper extremity","       "}  Functional ROM: {Blank single:19197::"Improved after treatment","Impaired ROM","Adequate ROM","Decreased ROM","Diminished ROM","Full ROM","Fused","Grossly intact ROM","Guarding","Limited ROM","Mechanically restricted ROM","Minimal ROM","Pain restricted ROM","Restricted ROM","Zero ROM","ROM appears unrestricted","ROM is within functional limits (WFL)","ROM is within normal limits (WNL)","Unrestricted ROM"} {Blank single:19197::"for shoulder","for elbow","for shoulder and elbow","for wrist","for wrist and hand","for hand","for all joints of upper extremity","       "}  Muscle strength & Tone: {Blank single:19197::"Inconsistent level of performance when tested","Normal strength (5/5)","Movement possible against some resistance (4/5)","Movement possible against gravity, but not against resistance (3/5)","Movement possible, but not against gravity (7/4)","BSWHQP flickering, but no movement (1/5)","No motor contraction (0/5)","Flaccid paralysis","Spastic paralysis","Cogwheel rigidity","Clasp-knife rigidity","Give-away weakness","Deconditioned","Mild-to-medarate deconditioning","Moderate-to-severe deconditioning","Guarding","WNL","Unremarkable","Grossly normal","Grossly intact","Functionally intact"}  Muscle strength & Tone: {Blank single:19197::"Inconsistent level of performance when tested","Normal strength (5/5)","Movement possible against some  resistance (4/5)","Movement possible against gravity, but not against resistance (3/5)","Movement possible, but not against gravity (5/9)","FMBWGY flickering, but no movement (1/5)","No motor contraction (0/5)","Flaccid paralysis","Spastic paralysis","Cogwheel rigidity","Clasp-knife rigidity","Give-away weakness","Deconditioned","Mild-to-medarate deconditioning","Moderate-to-severe deconditioning","Guarding","WNL","Unremarkable","Grossly normal","Grossly intact","Functionally intact"}  Sensory: {Blank single:19197::"Improved","Movement-associated pain","Movement-associated discomfort","Impaired sensorium","Articular pain pattern","Arthropathic arthralgia","Dermatomal pain pattern","Non-dermatomal pain pattern","Musculoskeletal pain pattern","Myotome pain pattern","Neurogenic pain pattern","Neuropathic pain pattern","Referred pain pattern","Visceral pain pattern","Allodynia (  Painful response to non-painful stimuli)","Anesthesia (Absence of sensation)","Anesthesia Dolorosa (Numbness over painful area)","Dysesthesias (Unpleasant sensation to touch)","Hyperalgesia (Increased sensitivity to pain)","Hyperesthesia (Increased sensitivity to touch)","Hyperpathia (Painful, exaggerated response to nociceptive stimuli)","Hypoalgesia (Decreased sensitivity to painful stimuli)","Hypoesthesia/Hypesthesia (Reduced sensation to touch)","Paresthesia (Burning sensation)","Paresthesia (Tingling sensation)","WNL","No anomaly detected","Unimpaired"}  Sensory: {Blank single:19197::"Improved","Movement-associated pain","Movement-associated discomfort","Impaired sensorium","Articular pain pattern","Arthropathic arthralgia","Dermatomal pain pattern","Non-dermatomal pain pattern","Musculoskeletal pain pattern","Myotome pain pattern","Neurogenic pain pattern","Neuropathic pain pattern","Referred pain pattern","Visceral pain pattern","Allodynia (Painful response to non-painful stimuli)","Anesthesia (Absence of sensation)","Anesthesia Dolorosa  (Numbness over painful area)","Dysesthesias (Unpleasant sensation to touch)","Hyperalgesia (Increased sensitivity to pain)","Hyperesthesia (Increased sensitivity to touch)","Hyperpathia (Painful, exaggerated response to nociceptive stimuli)","Hypoalgesia (Decreased sensitivity to painful stimuli)","Hypoesthesia/Hypesthesia (Reduced sensation to touch)","Paresthesia (Burning sensation)","Paresthesia (Tingling sensation)","WNL","No anomaly detected","Unimpaired"}  Palpation: {Blank single:19197::"Tender","Complains of area being tender to palpation","Uncomfortable","Positive","Negative","Increased muscle tone","Trigger Point","Muscular Atrophy","Non-tender","No complaints of tenderness","Hyperthermic","Hypothermic","Euthermic","No palpable anomalies"} {Blank single:19197::"Tinel's test","Trousseau's test (3 minute blood pressure cuff test) for hypocalcemia","     "} {Blank single:19197::"for right CTS","for left CTS","for bilateral CTS","     "}  Palpation: {Blank single:19197::"Tender","Complains of area being tender to palpation","Uncomfortable","Positive","Negative","Increased muscle tone","Trigger Point","Muscular Atrophy","Non-tender","No complaints of tenderness","Hyperthermic","Hypothermic","Euthermic","No palpable anomalies"} {Blank single:19197::"Tinel's test","Trousseau's test (3 minute blood pressure cuff test) for hypocalcemia","     "} {Blank single:19197::"for right CTS","for left CTS","for bilateral CTS","     "}  Specialized Test(s): {Blank single:19197::"Tinel's","Phalen's","Tinel's/Phalen's","Deferred"} {Blank single:19197::"(+)","(-)","(+)/(-)","(-)/(+)","      "}  Specialized Test(s): {Blank single:19197::"Tinel's","Phalen's","Tinel's/Phalen's","Deferred"} {Blank single:19197::"(+)","(-)","(+)/(-)","(-)/(+)","      "}   Thoracic Spine Exam  Inspection: {Blank single:19197::"Well healed scar from previous spine surgery detected","prominent thoracic Kyphosis","Significant thoracic  kyphosis","Paravertebral muscle atrophy","No masses, redness, or swelling"} Alignment: {Blank single:19197::"Asymmetric","Symmetrical"} Functional ROM: {Blank single:19197::"Improved after treatment","Adequate ROM","Decreased ROM","Diminished ROM","Full ROM","Fused","Grossly intact ROM","Guarding","Limited ROM","Mechanically restricted ROM","Minimal ROM","Pain restricted ROM","Restricted ROM","Zero ROM","ROM appears unrestricted","ROM is within functional limits (WFL)","ROM is within normal limits (WNL)","Unrestricted ROM"} Stability: {Blank single:19197::"Possibly unstable","No instability detected"} Sensory: {Blank single:19197::"Improved","Movement associated pain","Movement associated discomfort","Impaired sensorium","Articular pain pattern","Dermatomal pain pattern","Musculoskeletal pain pattern","Myotome pain pattern","Neurogenic pain pattern","Neuropathic pain pattern","Referred pain pattern","Visceral pain pattern","Allodynia (Painful response to non-painful stimuli)","Anesthesia (Absence of sensation)","Anesthesia Dolorosa (Numbness over painful area)","Dysesthesias (Unpleasant sensation to touch)","Hyperalgesia (Increased sensitivity to pain)","Hyperesthesia (Increased sensitivity to touch)","Hyperpathia (Painful, exaggerated response to nociceptive stimuli)","Hypoalgesia (Decreased sensitivity to painful stimuli)","Hypoesthesia/Hypesthesia (Reduced sensation to touch)","Paresthesia (Burning sensation)","Paresthesia (Tingling sensation)","WNL","No anomaly detected","Unimpaired"} Muscle strength & Tone: {Blank single:19197::"Tender","Complains of area being tender to palpation","Uncomfortable","Positive","Negative","Increased muscle tone","Trigger Point","Muscular Atrophy","Non-tender","No complaints of tenderness","Hyperthermic","Hypothermic","Euthermic","No palpable anomalies"}  Lumbar Spine Exam  Inspection: {Blank single:19197::"Well healed scar from previous spine surgery  detected","Thoraco-lumbar Scoliosis","Lumbar Scoliosis","Paravertebral muscle atrophy","No masses, redness, or swelling"} Alignment: {Blank single:19197::"Scoliosis detected","Levoscoliosis","Dextroscoliosis","Asymmetric","Symmetrical"} Functional ROM: {Blank single:19197::"Improved after treatment","Adequate ROM","Decreased ROM","Diminished ROM","Full ROM","Fused","Grossly intact ROM","Guarding","Limited ROM","Mechanically restricted ROM","Minimal ROM","Pain restricted ROM","Restricted ROM","Zero ROM","ROM appears unrestricted","ROM is within functional limits (WFL)","ROM is within normal limits (WNL)","Unrestricted ROM"}{Blank single:19197::", to the right",", to the left",", bilaterally","     "} Stability: {Blank single:19197::"Possibly unstable","No instability detected"} Muscle strength & Tone: {Blank single:19197::"Increased muscle tone over affected area","Inconsistent level of performance when tested","Functionally intact"} Sensory: {Blank single:19197::"Improved","Movement-associated pain","Movement-associated discomfort","Impaired sensorium","Articular pain pattern","Dermatomal pain pattern","Musculoskeletal pain pattern","Myotome pain pattern","Neurogenic pain pattern","Neuropathic pain pattern","Referred pain pattern","Visceral pain pattern","Allodynia (Painful response to non-painful stimuli)","Anesthesia (Absence of sensation)","Anesthesia Dolorosa (Numbness over painful area)","Dysesthesias (Unpleasant sensation to touch)","Hyperalgesia (Increased sensitivity to pain)","Hyperesthesia (Increased sensitivity to touch)","Hyperpathia (Painful, exaggerated response to nociceptive stimuli)","Hypoalgesia (Decreased sensitivity to painful stimuli)","Hypoesthesia/Hypesthesia (Reduced sensation to touch)","Paresthesia (Burning sensation)","Paresthesia (Tingling sensation)","WNL","No anomaly detected","Unimpaired"} Palpation: {Blank single:19197::"Tender","Complains of area being tender to  palpation","Uncomfortable","Positive","Negative","Increased muscle tone","Trigger Point","Muscular Atrophy","Non-tender","No complaints of tenderness","Hyperthermic","Hypothermic","Euthermic","No palpable anomalies"} {Blank single:19197::"Right Fist Percussion Test","Left Fist Percussion Test","Bilateral Fist Percussion Test","     "}  Provocative Tests: Lumbar Hyperextension and rotation test: {Blank single:19197::"Positive","Negative","Equivocal","Improved after treatment","Unable to perform","Non-contributory","improved","worsened","no change from prior assessment","evaluation deferred today"} {Blank single:19197::"bilaterally for facet joint pain.","on the right for facet joint pain.","on the left for facet joint pain.","due to pain.","due to fusion restriction.","     "} Patrick's Maneuver: {Blank single:19197::"Positive","Negative","Non-diagnostic","Improved after treatment","Unable to perform","worsened","unimproved","Unchanged","no change from prior assessment","evaluation deferred today"} {Blank single:19197::"for bilateral S-I arthralgia","for right-sided S-I arthralgia","for left-sided S-I arthralgia","     "} {Blank single:19197::"and","     "} {Blank single:19197::"for bilateral hip arthralgia","for right hip arthralgia","for left hip arthralgia","due to pain","due to fusion restriction","     "}  Gait & Posture Assessment  Ambulation: {Blank single:19197::"Limited","Patient ambulates using a cane","Patient ambulates using crutches","Patient ambulates using a walker","Patient ambulates using a wheel chair","Patient came in today in a wheel chair","Incapable of ambulation without assistance","Nonfunctional","Dependent, Level II (constant assistance required)","Dependent, Level I (intermittent assistance required)","Dependent, Supervision required","Independent, Level surfaces only","Independent, Level and Non-level surfaces","Unassisted"} Gait: {Blank single:19197::"Age-related, senile gait  pattern","Antalgic","Limited. Using assistive device to ambulate","Very limited, using assistive device to ambulate","Antalgic gait (limping)","Apraxia (Inability to execute a movement, upon request, without loss of motor or sensory function)","Ataxia (Poor voluntary coordination)","Ataxia (Appendicular)","Ataxia (Cerebellar)(Irregular, uncoordinated movement with inability to balance on one leg or perform tandem gait test)","Ataxia (Friedreich's)","Ataxia (Sensory) (Unsteady "stomping" gait with heavy heel strikes. Postural instability worsened by closing eyes.)","Ataxia (Truncal)("Drunken sailor" gait)","Ataxia (Vestibular)","Awkward","Cautious","Charlie-Chaplin (due to tibial torsion)","Choreiform (Irregular, jerky, involuntary movements)(Hyperkinetic)","Circumduction gait (due to hemiplegia)","Clumsy","Compensatory","Dystaxia (Mild degree of ataxia)","Dystonic","Frontal gait (Apraxia)","Hemiataxia (Ataxia limited to one side)","Hemiparetic (Post-stroke)(Ipsilateral arm flexion and tiptoe/lateral foot walk)","High stepping gait (foot drop)","Modified gait pattern (slower gait speed, wider stride width, and longer stance duration) associated with morbid obesity","Paraparetic (Stiffness, extension, adduction and scissoring of both legs)","Parkinsonian","Psychogenic","Scissor gait (cerebral palsy)","Shuffling gait","Staggering","Steppage","Stiff hip gait (hip ankylosis)","Stumbling","Trendelenburg (unstable hip)","Unaffected","Uneven","Waddling (Hip pathology)","Functionally WNL","Grossly intact","Improved after treatment","Relatively normal for age and body habitus"} Posture: {Blank single:19197::"Antalgic","Difficulty standing up straight, due to pain","Positive Romberg's test (Sensory Ataxia)(Worsening of balance and pointing with eyes closed)","Painful","Recombent","Relaxed","Tense","Difficulty with positional changes","Sway back","Lumbar lordosis","Thoracic kyphosis","Kyphosis-lordosis","Flat back","Forward  head","Neutral Spine","Slouching","Drooping","Rigid","Poor","Good","WNL"}   Lower Extremity Exam    Side: Right lower extremity  Side: Left lower extremity  Inspection: {Blank single:19197::"Below knee amputation (BKA)","Above knee amputation (AKA)","Contracture","Atrophy","Dystrophy","No gross anomalies detected","Edema","Pitting edema","Venous stasis edema","Positive color changes","Some redness observed","Increased temperature","Acrocyanosis","Normal skin color, temperature, and hair growth. No peripheral edema or cyanosis","No masses, redness, swelling, or asymmetry. No contractures"}  Inspection: {Blank single:19197::"Below knee amputation (BKA)","Above knee amputation (AKA)","Contracture","Atrophy","Dystrophy","No gross anomalies detected","Edema","Pitting edema","Venous stasis edema","Positive color changes","Some redness observed","Increased temperature","Acrocyanosis","Normal skin color, temperature, and hair growth. No peripheral edema or cyanosis","No masses, redness, swelling, or asymmetry. No contractures"}  Functional ROM: {Blank single:19197::"Improved after treatment","Impaired ROM","Adequate ROM","Decreased ROM","Diminished ROM","Full ROM","Fused","Grossly intact ROM","Guarding","Limited ROM","Mechanically restricted ROM","Minimal ROM","Pain restricted ROM","Restricted ROM","Zero ROM","ROM appears unrestricted","ROM is within functional limits (WFL)","ROM is within normal limits (WNL)","Unrestricted ROM"} {Blank single:19197::"for hip joint","for knee joint","for hip and knee joints","for all joints of the lower extremity","       "}  Functional ROM: {Blank single:19197::"Improved after treatment","Impaired ROM","Adequate ROM","Decreased ROM","Diminished ROM","Full ROM","Fused","Grossly intact ROM","Guarding","Limited ROM","Mechanically restricted ROM","Minimal ROM","Pain restricted ROM","Restricted ROM","Zero ROM","ROM appears unrestricted","ROM is within functional limits (WFL)","ROM is  within normal limits (WNL)","Unrestricted ROM"} {Blank single:19197::"for hip joint","for knee joint","for hip and knee joints","for all joints of the lower extremity","       "}  Muscle strength & Tone: {Blank single:19197::"Able to Toe-walk & Heel-walk without problems","Give-away weakness","Deconditioned","Mild-to-moderate deconditioning","Moderate-to-severe deconditioning","Guarding","Inconsistent level of performance when tested","Normal strength (5/5)","Movement possible against some resistance (4/5)","Movement possible against gravity, but not against resistance (3/5)","Movement possible, but not against gravity (7/0)","YFVCBS flickering, but no movement (  1/5)","No motor contraction (0/5)","Flaccid paralysis","Spastic paralysis","Cogwheel rigidity","Clasp-knife rigidity","WNL","Unremarkable","Grossly normal","Grossly intact","Functionally intact"}  Muscle strength & Tone: {Blank single:19197::"Able to Toe-walk & Heel-walk without problems","Give-away weakness","Deconditioned","Mild-to-moderate deconditioning","Moderate-to-severe deconditioning","Guarding","Inconsistent level of performance when tested","Normal strength (5/5)","Movement possible against some resistance (4/5)","Movement possible against gravity, but not against resistance (3/5)","Movement possible, but not against gravity (7/6)","LYYTKP flickering, but no movement (1/5)","No motor contraction (0/5)","Flaccid paralysis","Spastic paralysis","Cogwheel rigidity","Clasp-knife rigidity","WNL","Unremarkable","Grossly normal","Grossly intact","Functionally intact"}  Sensory: {Blank single:19197::"Improved","Movement-associated pain","Movement-associated discomfort","Impaired sensorium","Articular pain pattern","Arthropathic arthralgia","Dermatomal pain pattern","Non-dermatomal pain pattern","Musculoskeletal pain pattern","Myotome pain pattern","Neurogenic pain pattern","Neuropathic pain pattern","Referred pain pattern","Visceral pain  pattern","Allodynia (Painful response to non-painful stimuli)","Anesthesia (Absence of sensation)","Anesthesia Dolorosa (Numbness over painful area)","Dysesthesias (Unpleasant sensation to touch)","Hyperalgesia (Increased sensitivity to pain)","Hyperesthesia (Increased sensitivity to touch)","Hyperpathia (Painful, exaggerated response to nociceptive stimuli)","Hypoalgesia (Decreased sensitivity to painful stimuli)","Hypoesthesia/Hypesthesia (Reduced sensation to touch)","Paresthesia (Burning sensation)","Paresthesia (Tingling sensation)","WNL","No anomaly detected","Unimpaired"}  Sensory: {Blank single:19197::"Improved","Movement-associated pain","Movement-associated discomfort","Impaired sensorium","Articular pain pattern","Arthropathic arthralgia","Dermatomal pain pattern","Non-dermatomal pain pattern","Musculoskeletal pain pattern","Myotome pain pattern","Neurogenic pain pattern","Neuropathic pain pattern","Referred pain pattern","Visceral pain pattern","Allodynia (Painful response to non-painful stimuli)","Anesthesia (Absence of sensation)","Anesthesia Dolorosa (Numbness over painful area)","Dysesthesias (Unpleasant sensation to touch)","Hyperalgesia (Increased sensitivity to pain)","Hyperesthesia (Increased sensitivity to touch)","Hyperpathia (Painful, exaggerated response to nociceptive stimuli)","Hypoalgesia (Decreased sensitivity to painful stimuli)","Hypoesthesia/Hypesthesia (Reduced sensation to touch)","Paresthesia (Burning sensation)","Paresthesia (Tingling sensation)","WNL","No anomaly detected","Unimpaired"}  Palpation: {Blank single:19197::"Tender","Complains of area being tender to palpation","Uncomfortable","Positive","Negative","Increased muscle tone","Trigger Point","Muscular Atrophy","Non-tender","No complaints of tenderness","Hyperthermic","Hypothermic","Euthermic","No palpable anomalies"}  Palpation: {Blank single:19197::"Tender","Complains of area being tender to  palpation","Uncomfortable","Positive","Negative","Increased muscle tone","Trigger Point","Muscular Atrophy","Non-tender","No complaints of tenderness","Hyperthermic","Hypothermic","Euthermic","No palpable anomalies"}   Assessment  Primary Diagnosis & Pertinent Problem List: There were no encounter diagnoses.  Status Diagnosis  {Problem Stability:19197::"Improving","Improved","Not improving","Not responding","Persistent","Recurring","Reoccurring","Deteriorating","Having a Flare-up","Responding","Resolved","Stable","Unimproved","Worsening","Controlled"} {Problem Stability:19197::"Improving","Improved","Not improving","Not responding","Persistent","Recurring","Reoccurring","Deteriorating","Having a Flare-up","Responding","Resolved","Stable","Unimproved","Worsening","Controlled"} {Problem Stability:19197::"Improving","Improved","Not improving","Not responding","Persistent","Recurring","Reoccurring","Deteriorating","Having a Flare-up","Responding","Resolved","Stable","Unimproved","Worsening","Controlled"} No diagnosis found.  Problems updated and reviewed during this visit: No problems updated. Plan of Care  Pharmacotherapy (Medications Ordered): No orders of the defined types were placed in this encounter.  New Prescriptions   No medications on file   Medications administered today: Mr. Senger had no medications administered during this visit. Lab-work, procedure(s), and/or referral(s): No orders of the defined types were placed in this encounter.  Imaging and/or referral(s): None  Interventional therapies: Planned, scheduled, and/or pending:   {Blank single:19197::"Not indicated","Medically contraindicated","On anticoagulants","To be determined at a later time","Not at this time."}   Considering:   ***   Palliative PRN treatment(s):   ***   Provider-requested follow-up: No Follow-up on file.  Future Appointments Date Time Provider [place]  12/16/2016 1:00 PM Vevelyn Francois, NP [facility] None   Primary Care Physician: [place] Pink, MD Location: [facility] Note by: Vevelyn Francois NP Date: 12/16/2016; Time: 12:57 PM  Pain Score Disclaimer: We use the NRS-11 scale. This is a self-reported, subjective measurement of pain severity with only modest accuracy. It is used primarily to identify changes within a particular patient. It must be understood that outpatient pain scales are significantly less accurate that those used for research, where they can be applied under ideal controlled circumstances with minimal exposure to variables. In reality, the score is likely to be a combination of pain intensity and pain affect, where pain affect describes the degree of emotional arousal or changes in action readiness caused by the sensory experience of pain. Factors such as social and work situation, setting, emotional state, anxiety levels, expectation, and prior pain experience may influence pain perception and show large inter-individual differences that may also be affected by time variables.  Patient instructions provided during this appointment: There are no Patient Instructions  on file for this visit.

## 2016-12-16 NOTE — Progress Notes (Signed)
Nursing Pain Medication Assessment:  Safety precautions to be maintained throughout the outpatient stay will include: orient to surroundings, keep bed in low position, maintain call bell within reach at all times, provide assistance with transfer out of bed and ambulation.  Medication Inspection Compliance: Mr. Meece did not comply with our request to bring his pills to be counted. He was reminded that bringing the medication bottles, even when empty, is a requirement.  Medication: None brought in. Pill/Patch Count: None available to be counted. Bottle Appearance: No container available. Did not bring bottle(s) to appointment. Filled Date: N/A Last Medication intake:  Today 

## 2016-12-16 NOTE — Patient Instructions (Signed)

## 2016-12-20 LAB — TOXASSURE SELECT 13 (MW), URINE

## 2017-02-11 ENCOUNTER — Encounter: Payer: Self-pay | Admitting: Anesthesiology

## 2017-02-11 ENCOUNTER — Ambulatory Visit: Payer: Medicare Other | Attending: Anesthesiology | Admitting: Anesthesiology

## 2017-02-11 VITALS — BP 126/71 | HR 53 | Temp 98.1°F | Resp 18 | Ht 70.0 in | Wt 220.0 lb

## 2017-02-11 DIAGNOSIS — M5116 Intervertebral disc disorders with radiculopathy, lumbar region: Secondary | ICD-10-CM | POA: Insufficient documentation

## 2017-02-11 DIAGNOSIS — Z79891 Long term (current) use of opiate analgesic: Secondary | ICD-10-CM | POA: Insufficient documentation

## 2017-02-11 DIAGNOSIS — M47816 Spondylosis without myelopathy or radiculopathy, lumbar region: Secondary | ICD-10-CM

## 2017-02-11 DIAGNOSIS — M5136 Other intervertebral disc degeneration, lumbar region: Secondary | ICD-10-CM

## 2017-02-11 DIAGNOSIS — M545 Low back pain, unspecified: Secondary | ICD-10-CM

## 2017-02-11 DIAGNOSIS — G894 Chronic pain syndrome: Secondary | ICD-10-CM | POA: Diagnosis not present

## 2017-02-11 DIAGNOSIS — G8929 Other chronic pain: Secondary | ICD-10-CM

## 2017-02-11 DIAGNOSIS — M4696 Unspecified inflammatory spondylopathy, lumbar region: Secondary | ICD-10-CM

## 2017-02-11 MED ORDER — XTAMPZA ER 36 MG PO C12A: 2.0000 | EXTENDED_RELEASE_CAPSULE | Freq: Two times a day (BID) | ORAL | 0 refills | Status: DC

## 2017-02-11 NOTE — Progress Notes (Signed)
Nursing Pain Medication Assessment:  Safety precautions to be maintained throughout the outpatient stay will include: orient to surroundings, keep bed in low position, maintain call bell within reach at all times, provide assistance with transfer out of bed and ambulation.  Medication Inspection Compliance: Peter Lamb did not comply with our request to bring his pills to be counted. He was reminded that bringing the medication bottles, even when empty, is a requirement.  Medication: None brought in. Pill/Patch Count: None available to be counted. Bottle Appearance: No container available. Did not bring bottle(s) to appointment. Filled Date: N/A Last Medication intake:  Today   Patient was not instructed at last visit to bring meds. Instructed that bottles of opiod meds must be brought to all med refill appointments for count.

## 2017-02-11 NOTE — Patient Instructions (Signed)
You were given 2 prescriptions for Xtampza today.

## 2017-02-14 NOTE — Progress Notes (Signed)
  CC:  Left side lower back pain with persistent left side posterior lateral leg pain.  HPI:  Peter Lamb presents for reevaluation last seen in May. He continues to do well with his current opioid regimen for his chronic and diffuse body pain. His primary pain complaint revolves around his low lumbar region with severe incapacitating intermittent low back pain that has responded favorably to chronic opioid management. He has failed conservative therapy. The quality characteristic addition we should've his low back pain have remained stable with no change in lower extremity strength or function. Bowel bladder function has remained stable and based on his narcotic assessment sheet he continues to derive good lifestyle functional improvement with his medication management. He denies diverting or illicit use.   Physical Exam:    PERRL, EOMI  Heart RRR   LCTA  He has a mildly antalgic gait. His strength appears to be at baseline.   Assessment:  1. 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 Facet arthropathy  3. chronic opioid management      PLAN:   1. We will have him return to clinic in 2 months for reevaluation. In the meantime I'm going to prescribe his extampza ER for the next 2 months of July 30 and August 29 refill dates. We have reviewed his [facility] practitioner database information and it is appropriate. If he has any concerns in the meantime he is to contact us. We have gone over the risks and benefits of opioid management of chronic nature times in the past and he is comfortable with his current regimen.  Vashti Hey, MD

## 2017-02-24 ENCOUNTER — Other Ambulatory Visit: Payer: Self-pay | Admitting: Anesthesiology

## 2017-02-24 DIAGNOSIS — M7918 Myalgia, other site: Secondary | ICD-10-CM

## 2017-02-26 ENCOUNTER — Other Ambulatory Visit: Payer: Self-pay | Admitting: *Deleted

## 2017-02-26 ENCOUNTER — Telehealth: Payer: Self-pay | Admitting: Anesthesiology

## 2017-02-26 DIAGNOSIS — M7918 Myalgia, other site: Secondary | ICD-10-CM

## 2017-02-26 MED ORDER — TIZANIDINE HCL 4 MG PO TABS: 4.0000 mg | ORAL_TABLET | Freq: Every day | ORAL | 1 refills | Status: DC

## 2017-02-26 NOTE — Telephone Encounter (Signed)
Patient request refill on Zanaflex.

## 2017-02-26 NOTE — Telephone Encounter (Signed)
Spoke with patient and let him know that Zanaflex was escribed to pharmacy with 1 refill to last until next appt time.

## 2017-02-26 NOTE — Telephone Encounter (Signed)
Voicemail left with patient that I would talk with Dr Andree Elk re zanaflex Rx.  Also, would like to know if he is completely out of meds and expressed the concern that we need to try and get his meds filled at appt times as we do not usely call meds over the phone.

## 2017-03-25 ENCOUNTER — Other Ambulatory Visit: Payer: Self-pay | Admitting: *Deleted

## 2017-03-25 ENCOUNTER — Telehealth: Payer: Self-pay | Admitting: *Deleted

## 2017-03-25 DIAGNOSIS — M7918 Myalgia, other site: Secondary | ICD-10-CM

## 2017-03-25 MED ORDER — TIZANIDINE HCL 4 MG PO TABS: 4.0000 mg | ORAL_TABLET | Freq: Every day | ORAL | 1 refills | Status: DC

## 2017-04-13 ENCOUNTER — Ambulatory Visit: Payer: Medicare Other | Attending: Anesthesiology | Admitting: Anesthesiology

## 2017-04-13 ENCOUNTER — Encounter: Payer: Self-pay | Admitting: Anesthesiology

## 2017-04-13 VITALS — BP 162/87 | HR 59 | Temp 97.7°F | Resp 18 | Ht 70.0 in | Wt 222.0 lb

## 2017-04-13 DIAGNOSIS — M791 Myalgia: Secondary | ICD-10-CM

## 2017-04-13 DIAGNOSIS — Z79891 Long term (current) use of opiate analgesic: Secondary | ICD-10-CM | POA: Diagnosis not present

## 2017-04-13 DIAGNOSIS — G894 Chronic pain syndrome: Secondary | ICD-10-CM | POA: Diagnosis not present

## 2017-04-13 DIAGNOSIS — R0789 Other chest pain: Secondary | ICD-10-CM | POA: Insufficient documentation

## 2017-04-13 DIAGNOSIS — Z7984 Long term (current) use of oral hypoglycemic drugs: Secondary | ICD-10-CM | POA: Insufficient documentation

## 2017-04-13 DIAGNOSIS — M792 Neuralgia and neuritis, unspecified: Secondary | ICD-10-CM

## 2017-04-13 DIAGNOSIS — M545 Low back pain: Secondary | ICD-10-CM | POA: Diagnosis present

## 2017-04-13 DIAGNOSIS — M4696 Unspecified inflammatory spondylopathy, lumbar region: Secondary | ICD-10-CM

## 2017-04-13 DIAGNOSIS — Z7982 Long term (current) use of aspirin: Secondary | ICD-10-CM | POA: Insufficient documentation

## 2017-04-13 DIAGNOSIS — M5136 Other intervertebral disc degeneration, lumbar region: Secondary | ICD-10-CM | POA: Diagnosis not present

## 2017-04-13 DIAGNOSIS — M47816 Spondylosis without myelopathy or radiculopathy, lumbar region: Secondary | ICD-10-CM

## 2017-04-13 DIAGNOSIS — M7918 Myalgia, other site: Secondary | ICD-10-CM

## 2017-04-13 MED ORDER — XTAMPZA ER 36 MG PO C12A: 2.0000 | EXTENDED_RELEASE_CAPSULE | Freq: Two times a day (BID) | ORAL | 0 refills | Status: DC

## 2017-04-13 NOTE — Patient Instructions (Signed)
You were given 2 prescriptions for Oxycodone. 

## 2017-04-13 NOTE — Progress Notes (Signed)
Nursing Pain Medication Assessment:  Safety precautions to be maintained throughout the outpatient stay will include: orient to surroundings, keep bed in low position, maintain call bell within reach at all times, provide assistance with transfer out of bed and ambulation.  Medication Inspection Compliance: Pill count conducted under aseptic conditions, in front of the patient. Neither the pills nor the bottle was removed from the patient's sight at any time. Once count was completed pills were immediately returned to the patient in their original bottle.  Medication: xtampza Pill/Patch Count: 38 of 120 pills remain Pill/Patch Appearance: Markings consistent with prescribed medication Bottle Appearance: Standard pharmacy container. Clearly labeled. Filled Date:08/29/ 2018 Last Medication intake:  Today

## 2017-04-14 NOTE — Progress Notes (Signed)
Subjective:  Patient ID: Peter Lamb, male    DOB: 1956/01/29  Age: 61 y.o. MRN: [id number]  CC: Back Pain (left, lower)   Procedure: None   HPI Peter Lamb presents for reevaluation last seen 2 months ago. He is doing well with his current medication regimen. The quality characteristic addition patient is low back pain of remained stable. Mainly an aching gnawing pain in the low back with radiation into the bilateral hip and posterior legs. The pain is worse with activity but no change in lower extremity strength or function or bowel or bladder function is noted. Based on his narcotic assessment sheet he continues to derive good lifestyle functional improvement with his medications. No change in bowel or bladder function is noted.  Outpatient Medications Prior to Visit  Medication Sig Dispense Refill  . aspirin 81 MG tablet Take 162 mg by mouth daily.    Marland Kitchen atenolol (TENORMIN) 100 MG tablet Take 50 mg by mouth 2 (two) times daily.    . Cinnamon 500 MG capsule Take 500 mg by mouth.    . diltiazem (DILTIAZEM CD) 240 MG 24 hr capsule Take 240 mg by mouth daily.    Marland Kitchen glucosamine-chondroitin 500-400 MG tablet Take 1 tablet by mouth once.    . glyBURIDE (DIABETA) 2.5 MG tablet Take 2.5 mg by mouth daily with breakfast.    . lisinopril (PRINIVIL,ZESTRIL) 20 MG tablet Take 20 mg by mouth daily.    . metFORMIN (GLUCOPHAGE) 500 MG tablet Take 500 mg by mouth daily with breakfast. Reported on 02/10/2016    . Multiple Vitamins-Minerals (VITAMINS TO GO MEN PO) Take 1 tablet by mouth daily.     . rosuvastatin (CRESTOR) 10 MG tablet Take 10 mg by mouth daily.    . sertraline (ZOLOFT) 50 MG tablet Take 100 mg by mouth at bedtime.     Marland Kitchen tiZANidine (ZANAFLEX) 4 MG tablet Take 1 tablet (4 mg total) by mouth daily. 30 tablet 1  . tiZANidine (ZANAFLEX) 4 MG tablet Take 1 tablet (4 mg total) by mouth daily. 30 tablet 1  . TraZODone HCl 150 MG TB24 Take by mouth at bedtime.    Marland Kitchen XTAMPZA ER 36 MG C12A  Take 2 capsules by mouth 2 (two) times daily. 120 each 0   No facility-administered medications prior to visit.     Review of Systems Cardiac: No angina or shortness of breath GI: No constipation or hematemesis CNS: No dizziness or sedation  Objective:  BP (!) 162/87   Pulse (!) 59   Temp 97.7 F (36.5 C) (Oral)   Resp 18   Ht 5\' 10"  (1.778 m)   Wt 222 lb (100.7 kg)   SpO2 98%   BMI 31.85 kg/m    BP Readings from Last 3 Encounters:  04/13/17 (!) 162/87  02/11/17 126/71  12/16/16 (!) 161/97     Wt Readings from Last 3 Encounters:  04/13/17 222 lb (100.7 kg)  02/11/17 220 lb (99.8 kg)  12/16/16 220 lb (99.8 kg)     Physical Exam Pt is alert and oriented PERRL EOMI HEART IS RRR no murmur or rub LCTA no wheezing or rhales MUSCULOSKELETAL reveals some paraspinous muscle tenderness but no overt trigger points. He is ambulating with an antalgic gait and his muscle tone and bulk is at baseline  Labs  Lab Results  Component Value Date   HGBA1C 9.0 03/04/2010   Lab Results  Component Value Date   CREATININE 0.92 08/21/2016    --------------------------------------------------------------------------------------------------------------------  Lab Results  Component Value Date   WBC 9.2 08/21/2016   HGB 15.3 08/21/2016   HCT 43.6 08/21/2016   PLT 219 08/21/2016   GLUCOSE 132 (H) 08/21/2016   ALT 31 03/04/2010   AST 23 03/04/2010   NA 135 08/21/2016   K 3.7 08/21/2016   CL 100 (L) 08/21/2016   CREATININE 0.92 08/21/2016   BUN 11 08/21/2016   CO2 26 08/21/2016   PSA 0.26 03/04/2010   HGBA1C 9.0 03/04/2010    --------------------------------------------------------------------------------------------------------------------- Dg Chest 2 View  Result Date: 08/21/2016 CLINICAL DATA:  Acute onset of left-sided chest tightness. Initial encounter. EXAM: CHEST  2 VIEW COMPARISON:  None. FINDINGS: The lungs are well-aerated. Mild bibasilar atelectasis is noted.  There is no evidence of pleural effusion or pneumothorax. The heart is normal in size; the mediastinal contour is within normal limits. No acute osseous abnormalities are seen. IMPRESSION: Mild bibasilar atelectasis noted.  Lungs otherwise grossly clear. Electronically Signed   By: Garald Balding M.D.   On: 08/21/2016 22:15     Assessment & Plan:   Peter Lamb was seen today for back pain.  Diagnoses and all orders for this visit:  DDD (degenerative disc disease), lumbar  Chronic pain syndrome  Long term current use of opiate analgesic  Facet arthritis of lumbar region Peter Lamb)  Musculoskeletal pain  Neurogenic pain  Other orders -     Discontinue: XTAMPZA ER 36 MG C12A; Take 2 capsules by mouth 2 (two) times daily. -     XTAMPZA ER 36 MG C12A; Take 2 capsules by mouth 2 (two) times daily.        ----------------------------------------------------------------------------------------------------------------------  Problem List Items Addressed This Visit      Musculoskeletal and Integument   DDD (degenerative disc disease), lumbar - Primary   Relevant Medications   XTAMPZA ER 36 MG C12A     Other   Chronic pain (Chronic)   Relevant Medications   XTAMPZA ER 36 MG C12A   Long term current use of opiate analgesic (Chronic)   Musculoskeletal pain (Chronic)   Neurogenic pain (Chronic)    Other Visit Diagnoses    Facet arthritis of lumbar region Peter Lamb)       Relevant Medications   XTAMPZA ER 36 MG C12A        ----------------------------------------------------------------------------------------------------------------------  1. DDD (degenerative disc disease), lumbar Continue with aerobic conditioning and back stretching strengthening exercises  2. Chronic pain syndrome We will refill his medications today for September 28 and October 28. I've reviewed to Peter Lamb practitioner database information and it is appropriate.  3. Long term current use of opiate  analgesic As above  4. Facet arthritis of lumbar region Peter Lamb) As above  5. Musculoskeletal pain Continue stretching  6. Neurogenic pain As above    ----------------------------------------------------------------------------------------------------------------------  I am having Mr. Rorie maintain his aspirin, metFORMIN, sertraline, atenolol, diltiazem, lisinopril, Cinnamon, Multiple Vitamins-Minerals (VITAMINS TO GO MEN PO), rosuvastatin, glucosamine-chondroitin, glyBURIDE, TraZODone HCl, tiZANidine, tiZANidine, and XTAMPZA ER.   Meds ordered this encounter  Medications  . DISCONTD: XTAMPZA ER 36 MG C12A    Sig: Take 2 capsules by mouth 2 (two) times daily.    Dispense:  120 each    Refill:  0    Do not fill until [id number]  . XTAMPZA ER 36 MG C12A    Sig: Take 2 capsules by mouth 2 (two) times daily.    Dispense:  120 each    Refill:  0    Do not fill  until [id number]   Patient's Medications  New Prescriptions   No medications on file  Previous Medications   ASPIRIN 81 MG TABLET    Take 162 mg by mouth daily.   ATENOLOL (TENORMIN) 100 MG TABLET    Take 50 mg by mouth 2 (two) times daily.   CINNAMON 500 MG CAPSULE    Take 500 mg by mouth.   DILTIAZEM (DILTIAZEM CD) 240 MG 24 HR CAPSULE    Take 240 mg by mouth daily.   GLUCOSAMINE-CHONDROITIN 500-400 MG TABLET    Take 1 tablet by mouth once.   GLYBURIDE (DIABETA) 2.5 MG TABLET    Take 2.5 mg by mouth daily with breakfast.   LISINOPRIL (PRINIVIL,ZESTRIL) 20 MG TABLET    Take 20 mg by mouth daily.   METFORMIN (GLUCOPHAGE) 500 MG TABLET    Take 500 mg by mouth daily with breakfast. Reported on 02/10/2016   MULTIPLE VITAMINS-MINERALS (VITAMINS TO GO MEN PO)    Take 1 tablet by mouth daily.    ROSUVASTATIN (CRESTOR) 10 MG TABLET    Take 10 mg by mouth daily.   SERTRALINE (ZOLOFT) 50 MG TABLET    Take 100 mg by mouth at bedtime.    TIZANIDINE (ZANAFLEX) 4 MG TABLET    Take 1 tablet (4 mg total) by mouth daily.   TIZANIDINE  (ZANAFLEX) 4 MG TABLET    Take 1 tablet (4 mg total) by mouth daily.   TRAZODONE HCL 150 MG TB24    Take by mouth at bedtime.  Modified Medications   Modified Medication Previous Medication   XTAMPZA ER 36 MG C12A XTAMPZA ER 36 MG C12A      Take 2 capsules by mouth 2 (two) times daily.    Take 2 capsules by mouth 2 (two) times daily.  Discontinued Medications   No medications on file   ----------------------------------------------------------------------------------------------------------------------  Follow-up: Return in about 2 months (around 06/13/2017) for evaluation, med refill.    Molli Barrows, MD

## 2017-06-07 ENCOUNTER — Ambulatory Visit: Payer: Medicare Other | Attending: Anesthesiology | Admitting: Anesthesiology

## 2017-06-08 ENCOUNTER — Other Ambulatory Visit: Payer: Self-pay

## 2017-06-08 ENCOUNTER — Ambulatory Visit: Payer: Medicare Other | Attending: Anesthesiology | Admitting: Anesthesiology

## 2017-06-08 ENCOUNTER — Encounter: Payer: Self-pay | Admitting: Anesthesiology

## 2017-06-08 VITALS — BP 151/93 | HR 53 | Temp 98.1°F | Resp 16 | Ht 70.0 in | Wt 240.0 lb

## 2017-06-08 DIAGNOSIS — G894 Chronic pain syndrome: Secondary | ICD-10-CM | POA: Diagnosis present

## 2017-06-08 DIAGNOSIS — M549 Dorsalgia, unspecified: Secondary | ICD-10-CM | POA: Diagnosis not present

## 2017-06-08 DIAGNOSIS — M5136 Other intervertebral disc degeneration, lumbar region: Secondary | ICD-10-CM | POA: Diagnosis not present

## 2017-06-08 DIAGNOSIS — M7918 Myalgia, other site: Secondary | ICD-10-CM | POA: Diagnosis not present

## 2017-06-08 DIAGNOSIS — Z79899 Other long term (current) drug therapy: Secondary | ICD-10-CM | POA: Diagnosis not present

## 2017-06-08 DIAGNOSIS — M4696 Unspecified inflammatory spondylopathy, lumbar region: Secondary | ICD-10-CM | POA: Diagnosis not present

## 2017-06-08 DIAGNOSIS — Z79891 Long term (current) use of opiate analgesic: Secondary | ICD-10-CM | POA: Diagnosis not present

## 2017-06-08 DIAGNOSIS — M545 Low back pain: Secondary | ICD-10-CM

## 2017-06-08 DIAGNOSIS — M47816 Spondylosis without myelopathy or radiculopathy, lumbar region: Secondary | ICD-10-CM

## 2017-06-08 DIAGNOSIS — G8929 Other chronic pain: Secondary | ICD-10-CM | POA: Diagnosis not present

## 2017-06-08 MED ORDER — TIZANIDINE HCL 4 MG PO TABS: 4.0000 mg | ORAL_TABLET | Freq: Once | ORAL | 3 refills | Status: DC

## 2017-06-08 MED ORDER — XTAMPZA ER 36 MG PO C12A: 2.0000 | EXTENDED_RELEASE_CAPSULE | Freq: Two times a day (BID) | ORAL | 0 refills | Status: DC

## 2017-06-08 NOTE — Progress Notes (Signed)
Nursing Pain Medication Assessment:  Safety precautions to be maintained throughout the outpatient stay will include: orient to surroundings, keep bed in low position, maintain call bell within reach at all times, provide assistance with transfer out of bed and ambulation.  Medication Inspection Compliance: Pill count conducted under aseptic conditions, in front of the patient. Neither the pills nor the bottle was removed from the patient's sight at any time. Once count was completed pills were immediately returned to the patient in their original bottle.  Medication: Xtampza ER  Pill/Patch Count: 56 out of 120 Pill/Patch Appearance: Markings consistent with prescribed medication Bottle Appearance: Standard pharmacy container. Clearly labeled. Filled Date: 43 / 28 / 2018 Last Medication intake:  Today

## 2017-06-08 NOTE — Progress Notes (Signed)
Subjective:  Patient ID: Peter Lamb, male    DOB: Aug 31, 1955  Age: 61 y.o. MRN: [id number]  CC: Back Pain (left lower)   Procedure: None  HPI Peter Lamb presents for reevaluation.  Peter Lamb was seen approximately 2 months ago and continues to do well with his opioid medications.  No problems are noted based on her discussion today.  I reviewed his narcotic assessment sheet and he continues to derive good functional improvement with the medication and is denying any significant problems.  No significant problems with constipation and no change in quality characteristic or distribution of his pain.  His bowel bladder function has been stable and strength is been stable and otherwise has been doing well with this recent change in medication.  Outpatient Medications Prior to Visit  Medication Sig Dispense Refill  . aspirin 81 MG tablet Take 162 mg by mouth daily.    Marland Kitchen atenolol (TENORMIN) 100 MG tablet Take 50 mg by mouth 2 (two) times daily.    . Cinnamon 500 MG capsule Take 500 mg daily by mouth.     . diltiazem (DILTIAZEM CD) 240 MG 24 hr capsule Take 240 mg by mouth daily.    Marland Kitchen glucosamine-chondroitin 500-400 MG tablet Take 1 tablet by mouth once.    Marland Kitchen lisinopril (PRINIVIL,ZESTRIL) 20 MG tablet Take 20 mg by mouth daily.    . metFORMIN (GLUCOPHAGE) 500 MG tablet Take 500 mg by mouth daily with breakfast. Reported on 02/10/2016    . Multiple Vitamins-Minerals (VITAMINS TO GO MEN PO) Take 1 tablet by mouth daily.     . nitroGLYCERIN (NITROSTAT) 0.4 MG SL tablet Place 1 tablet under tongue as needed for chest pain (may repeat every 5 minutes but seek medical help if pain persists after 3 tablets) as needed    . rosuvastatin (CRESTOR) 10 MG tablet Take 10 mg by mouth daily.    . sertraline (ZOLOFT) 50 MG tablet Take 100 mg by mouth at bedtime.     Marland Kitchen tiZANidine (ZANAFLEX) 4 MG tablet Take 1 tablet (4 mg total) by mouth daily. 30 tablet 1  . TraZODone HCl 150 MG TB24 Take by mouth at  bedtime.    Marland Kitchen glimepiride (AMARYL) 4 MG tablet Take 4 mg 2 (two) times daily by mouth.    Marland Kitchen tiZANidine (ZANAFLEX) 4 MG tablet Take 1 tablet (4 mg total) by mouth daily. 30 tablet 1  . XTAMPZA ER 36 MG C12A Take 2 capsules by mouth 2 (two) times daily. 120 each 0  . glipiZIDE (GLUCOTROL) 10 MG tablet Take 10 mg 2 (two) times daily by mouth.  3  . glyBURIDE (DIABETA) 2.5 MG tablet Take 2.5 mg by mouth daily with breakfast.     No facility-administered medications prior to visit.     Review of Systems CNS: No sedation or confusion Cardiac: No angina or palpitations GI: No constipation or abdominal pain  Objective:  BP (!) 151/93   Pulse (!) 53   Temp 98.1 F (36.7 C) (Oral)   Resp 16   Ht 5\' 10"  (1.778 m)   Wt 240 lb (108.9 kg)   SpO2 95%   BMI 34.44 kg/m    BP Readings from Last 3 Encounters:  06/08/17 (!) 151/93  04/13/17 (!) 162/87  02/11/17 126/71     Wt Readings from Last 3 Encounters:  06/08/17 240 lb (108.9 kg)  04/13/17 222 lb (100.7 kg)  02/11/17 220 lb (99.8 kg)     Physical Exam Pt  is alert and oriented PERRL EOMI HEART IS RRR no murmur or rub LCTA no wheezing or rhales MUSCULOSKELETAL reveals persistent paraspinous muscle tenderness but no overt trigger points he is ambulating well with good muscle tone and bulk to the lower extremities  Labs  Lab Results  Component Value Date   HGBA1C 9.0 03/04/2010   Lab Results  Component Value Date   CREATININE 0.92 08/21/2016    -------------------------------------------------------------------------------------------------------------------- Lab Results  Component Value Date   WBC 9.2 08/21/2016   HGB 15.3 08/21/2016   HCT 43.6 08/21/2016   PLT 219 08/21/2016   GLUCOSE 132 (H) 08/21/2016   ALT 31 03/04/2010   AST 23 03/04/2010   NA 135 08/21/2016   K 3.7 08/21/2016   CL 100 (L) 08/21/2016   CREATININE 0.92 08/21/2016   BUN 11 08/21/2016   CO2 26 08/21/2016   PSA 0.26 03/04/2010   HGBA1C 9.0  03/04/2010    --------------------------------------------------------------------------------------------------------------------- Dg Chest 2 View  Result Date: 08/21/2016 CLINICAL DATA:  Acute onset of left-sided chest tightness. Initial encounter. EXAM: CHEST  2 VIEW COMPARISON:  None. FINDINGS: The lungs are well-aerated. Mild bibasilar atelectasis is noted. There is no evidence of pleural effusion or pneumothorax. The heart is normal in size; the mediastinal contour is within normal limits. No acute osseous abnormalities are seen. IMPRESSION: Mild bibasilar atelectasis noted.  Lungs otherwise grossly clear. Electronically Signed   By: Garald Balding M.D.   On: 08/21/2016 22:15     Assessment & Plan:   Peter Lamb was seen today for back pain.  Diagnoses and all orders for this visit:  DDD (degenerative disc disease), lumbar  Chronic pain syndrome  Long term current use of opiate analgesic -     ToxASSURE Select 13 (MW), Urine  Facet arthritis of lumbar region [facility])  Musculoskeletal pain -     tiZANidine (ZANAFLEX) 4 MG tablet; Take 1 tablet (4 mg total) once for 1 dose by mouth.  Chronic left-sided low back pain without sciatica  Other orders -     Discontinue: XTAMPZA ER 36 MG C12A; Take 2 capsules (72 mg total) 2 (two) times daily by mouth. -     XTAMPZA ER 36 MG C12A; Take 2 capsules (72 mg total) 2 (two) times daily by mouth.        ----------------------------------------------------------------------------------------------------------------------  Problem List Items Addressed This Visit      Unprioritized   Chronic low back pain (Chronic)   Relevant Medications   XTAMPZA ER 36 MG C12A   tiZANidine (ZANAFLEX) 4 MG tablet   Chronic pain (Chronic)   Relevant Medications   XTAMPZA ER 36 MG C12A   tiZANidine (ZANAFLEX) 4 MG tablet   DDD (degenerative disc disease), lumbar - Primary   Relevant Medications   XTAMPZA ER 36 MG C12A   tiZANidine (ZANAFLEX) 4 MG  tablet   Long term current use of opiate analgesic (Chronic)   Relevant Orders   ToxASSURE Select 13 (MW), Urine   Musculoskeletal pain (Chronic)   Relevant Medications   tiZANidine (ZANAFLEX) 4 MG tablet    Other Visit Diagnoses    Facet arthritis of lumbar region ([place])       Relevant Medications   XTAMPZA ER 36 MG C12A   tiZANidine (ZANAFLEX) 4 MG tablet        ----------------------------------------------------------------------------------------------------------------------  1. DDD (degenerative disc disease), lumbar Continue efforts at weight loss and core strengthening as reviewed  2. Chronic pain syndrome We will refill his medications for November 27  and December 27 and we reviewed the practitioner database for the state [facility] and is appropriate.  He is to return to clinic in 2 months We have reviewed the [facility] practitioner database information and it is appropriate. 3. Long term current use of opiate analgesic As above we will recheck his UDS - ToxASSURE Select 13 (MW), Urine  4. Facet arthritis of lumbar region ([place])   5. Musculoskeletal pain  - tiZANidine (ZANAFLEX) 4 MG tablet; Take 1 tablet (4 mg total) once for 1 dose by mouth.  Dispense: 30 tablet; Refill: 3  6. Chronic left-sided low back pain without sciatica     ----------------------------------------------------------------------------------------------------------------------  I have discontinued Sanjeev N. Rietz's glimepiride. I have also changed his tiZANidine. Additionally, I am having him maintain his aspirin, metFORMIN, sertraline, atenolol, diltiazem, lisinopril, Cinnamon, Multiple Vitamins-Minerals (VITAMINS TO GO MEN PO), rosuvastatin, glucosamine-chondroitin, glyBURIDE, TraZODone HCl, tiZANidine, glipiZIDE, nitroGLYCERIN, and XTAMPZA ER.   Meds ordered this encounter  Medications  . DISCONTD: glimepiride (AMARYL) 4 MG tablet    Sig: Take 4 mg 2 (two) times daily by  mouth.  Marland Kitchen glipiZIDE (GLUCOTROL) 10 MG tablet    Sig: Take 10 mg 2 (two) times daily by mouth.    Refill:  3  . nitroGLYCERIN (NITROSTAT) 0.4 MG SL tablet    Sig: Place 1 tablet under tongue as needed for chest pain (may repeat every 5 minutes but seek medical help if pain persists after 3 tablets) as needed  . DISCONTD: XTAMPZA ER 36 MG C12A    Sig: Take 2 capsules (72 mg total) 2 (two) times daily by mouth.    Dispense:  120 each    Refill:  0    Do not fill until [id number]  . XTAMPZA ER 36 MG C12A    Sig: Take 2 capsules (72 mg total) 2 (two) times daily by mouth.    Dispense:  120 each    Refill:  0    Do not fill until [id number]  . tiZANidine (ZANAFLEX) 4 MG tablet    Sig: Take 1 tablet (4 mg total) once for 1 dose by mouth.    Dispense:  30 tablet    Refill:  3     Medication List        Accurate as of 06/08/17  2:20 PM. Always use your most recent med list.          aspirin 81 MG tablet   atenolol 100 MG tablet Commonly known as:  TENORMIN   Cinnamon 500 MG capsule   DILTIAZEM CD 240 MG 24 hr capsule Generic drug:  diltiazem   glipiZIDE 10 MG tablet Commonly known as:  GLUCOTROL   glucosamine-chondroitin 500-400 MG tablet   glyBURIDE 2.5 MG tablet Commonly known as:  DIABETA   lisinopril 20 MG tablet Commonly known as:  PRINIVIL,ZESTRIL   metFORMIN 500 MG tablet Commonly known as:  GLUCOPHAGE   nitroGLYCERIN 0.4 MG SL tablet Commonly known as:  NITROSTAT   rosuvastatin 10 MG tablet Commonly known as:  CRESTOR   sertraline 50 MG tablet Commonly known as:  ZOLOFT   * tiZANidine 4 MG tablet Commonly known as:  ZANAFLEX Take 1 tablet (4 mg total) by mouth daily.   * tiZANidine 4 MG tablet Commonly known as:  ZANAFLEX Take 1 tablet (4 mg total) once for 1 dose by mouth.   TraZODone HCl 150 MG Tb24   VITAMINS TO GO MEN PO   XTAMPZA ER 36 MG C12a Generic  drug:  OxyCODONE ER Take 2 capsules (72 mg total) 2 (two) times daily by mouth.      *  This list has 2 medication(s) that are the same as other medications prescribed for you. Read the directions carefully, and ask your doctor or other care provider to review them with you.          Where to Get Your Medications    These medications were sent to [id number] - [place], [address]  [place], [place] [address]   Phone:  [phone]   tiZANidine 4 MG tablet   You can get these medications from any pharmacy   Bring a paper prescription for each of these medications  XTAMPZA ER 36 MG C12a    ----------------------------------------------------------------------------------------------------------------------  Follow-up: Return in about 2 months (around 08/08/2017) for evaluation, med refill.    Molli Barrows, MD

## 2017-06-08 NOTE — Patient Instructions (Addendum)
You have been given 2 scripts for xtampza today. You also have Tizanidine which can be picked up at your pharmacy.  You submitted a urine for UDS also today.

## 2017-06-13 LAB — TOXASSURE SELECT 13 (MW), URINE

## 2017-08-18 ENCOUNTER — Encounter: Payer: Self-pay | Admitting: Nurse Practitioner

## 2017-08-18 ENCOUNTER — Other Ambulatory Visit: Payer: Self-pay

## 2017-08-18 ENCOUNTER — Ambulatory Visit: Payer: Medicare Other | Attending: Nurse Practitioner | Admitting: Nurse Practitioner

## 2017-08-18 VITALS — BP 151/85 | HR 55 | Temp 98.4°F | Resp 16 | Ht 70.0 in | Wt 235.0 lb

## 2017-08-18 DIAGNOSIS — M545 Low back pain, unspecified: Secondary | ICD-10-CM

## 2017-08-18 DIAGNOSIS — Z87891 Personal history of nicotine dependence: Secondary | ICD-10-CM | POA: Diagnosis not present

## 2017-08-18 DIAGNOSIS — E119 Type 2 diabetes mellitus without complications: Secondary | ICD-10-CM | POA: Insufficient documentation

## 2017-08-18 DIAGNOSIS — Z79899 Other long term (current) drug therapy: Secondary | ICD-10-CM | POA: Insufficient documentation

## 2017-08-18 DIAGNOSIS — I110 Hypertensive heart disease with heart failure: Secondary | ICD-10-CM | POA: Insufficient documentation

## 2017-08-18 DIAGNOSIS — E785 Hyperlipidemia, unspecified: Secondary | ICD-10-CM | POA: Diagnosis not present

## 2017-08-18 DIAGNOSIS — M7918 Myalgia, other site: Secondary | ICD-10-CM

## 2017-08-18 DIAGNOSIS — M792 Neuralgia and neuritis, unspecified: Secondary | ICD-10-CM | POA: Diagnosis not present

## 2017-08-18 DIAGNOSIS — Z7984 Long term (current) use of oral hypoglycemic drugs: Secondary | ICD-10-CM | POA: Diagnosis not present

## 2017-08-18 DIAGNOSIS — K219 Gastro-esophageal reflux disease without esophagitis: Secondary | ICD-10-CM | POA: Diagnosis not present

## 2017-08-18 DIAGNOSIS — M5136 Other intervertebral disc degeneration, lumbar region: Secondary | ICD-10-CM | POA: Insufficient documentation

## 2017-08-18 DIAGNOSIS — M79606 Pain in leg, unspecified: Secondary | ICD-10-CM

## 2017-08-18 DIAGNOSIS — I509 Heart failure, unspecified: Secondary | ICD-10-CM | POA: Insufficient documentation

## 2017-08-18 DIAGNOSIS — Z5181 Encounter for therapeutic drug level monitoring: Secondary | ICD-10-CM | POA: Insufficient documentation

## 2017-08-18 DIAGNOSIS — I2510 Atherosclerotic heart disease of native coronary artery without angina pectoris: Secondary | ICD-10-CM | POA: Insufficient documentation

## 2017-08-18 DIAGNOSIS — Z79891 Long term (current) use of opiate analgesic: Secondary | ICD-10-CM | POA: Diagnosis not present

## 2017-08-18 DIAGNOSIS — Z7982 Long term (current) use of aspirin: Secondary | ICD-10-CM | POA: Diagnosis not present

## 2017-08-18 DIAGNOSIS — M109 Gout, unspecified: Secondary | ICD-10-CM | POA: Diagnosis not present

## 2017-08-18 DIAGNOSIS — G894 Chronic pain syndrome: Secondary | ICD-10-CM

## 2017-08-18 DIAGNOSIS — G8929 Other chronic pain: Secondary | ICD-10-CM | POA: Diagnosis not present

## 2017-08-18 DIAGNOSIS — F329 Major depressive disorder, single episode, unspecified: Secondary | ICD-10-CM | POA: Insufficient documentation

## 2017-08-18 MED ORDER — TIZANIDINE HCL 4 MG PO TABS: 4.0000 mg | ORAL_TABLET | Freq: Every day | ORAL | 1 refills | Status: DC

## 2017-08-18 MED ORDER — XTAMPZA ER 36 MG PO C12A: 2.0000 | EXTENDED_RELEASE_CAPSULE | Freq: Two times a day (BID) | ORAL | 0 refills | Status: DC

## 2017-08-18 NOTE — Patient Instructions (Signed)
  BMI Assessment: Estimated body mass index is 33.72 kg/m as calculated from the following:   Height as of this encounter: 5\' 10"  (1.778 m).   Weight as of this encounter: 235 lb (106.6 kg).  BMI interpretation table: BMI level Category Range association with higher incidence of chronic pain  <18 kg/m2 Underweight   18.5-24.9 kg/m2 Ideal body weight   25-29.9 kg/m2 Overweight Increased incidence by 20%  30-34.9 kg/m2 Obese (Class I) Increased incidence by 68%  35-39.9 kg/m2 Severe obesity (Class II) Increased incidence by 136%  >40 kg/m2 Extreme obesity (Class III) Increased incidence by 254%   BMI Readings from Last 4 Encounters:  08/18/17 33.72 kg/m  06/08/17 34.44 kg/m  04/13/17 31.85 kg/m  02/11/17 31.57 kg/m   Wt Readings from Last 4 Encounters:  08/18/17 235 lb (106.6 kg)  06/08/17 240 lb (108.9 kg)  04/13/17 222 lb (100.7 kg)  02/11/17 220 lb (99.8 kg)

## 2017-08-18 NOTE — Progress Notes (Signed)
Patient's Name: Peter Lamb  MRN: [id number]  Referring Provider: Maryland Pink, MD  DOB: 1955/09/21  PCP: Maryland Pink, MD  DOS: 08/18/2017  Note by: Vevelyn Francois NP  Service setting: Ambulatory outpatient  Specialty: Interventional Pain Management  Location: [facility] (AMB) Pain Management Facility    Patient type: Established    Primary Reason(s) for Visit: Encounter for prescription drug management. (Level of risk: moderate)  CC: Back Pain (lower left)  HPI  Mr. Baig is a 62 y.o. year old, male patient, who comes today for a medication management evaluation. He has DM; GOUT; DEPRESSION; CAD; Chronic pain; Long term current use of opiate analgesic; Long term prescription opiate use; Opiate use; Encounter for therapeutic drug level monitoring; Arteriosclerosis of coronary artery; Type 2 diabetes mellitus ([place]); HLD (hyperlipidemia); BP (high blood pressure); Chronic low back pain; Chronic pain of lower extremity; Musculoskeletal pain; Neurogenic pain; and DDD (degenerative disc disease), lumbar on their problem list. His primarily concern today is the Back Pain (lower left)  Pain Assessment: Location: Lower, Left Back Radiating: denies Onset: More than a month ago Duration: Chronic pain Quality: Constant Severity: 4 /10 (self-reported pain score)  Note: Reported level is compatible with observation.                          Effect on ADL: stays home more, difficult to leave home Timing: Constant Modifying factors: medications  Mr. Harvard was last scheduled for an appointment on Visit date not found for medication management. During today's appointment we reviewed Mr. Collymore chronic pain status, as well as his outpatient medication regimen.  The patient  reports that he does not use drugs. His body mass index is 33.72 kg/m.  Further details on both, my assessment(s), as well as the proposed treatment plan, please see below.  Controlled Substance Pharmacotherapy  Assessment REMS (Risk Evaluation and Mitigation Strategy)  Analgesic: Xtampza 36 mg 1 tab daily MME/day: 54 mg/day.  Rise Patience  08/18/2017 10:32 AM  Sign at close encounter Nursing Pain Medication Assessment:  Safety precautions to be maintained throughout the outpatient stay will include: orient to surroundings, keep bed in low position, maintain call bell within reach at all times, provide assistance with transfer out of bed and ambulation.  Medication Inspection Compliance: Mr. Hevia did not comply with our request to bring his pills to be counted. He was reminded that bringing the medication bottles, even when empty, is a requirement.  Medication: None brought in. Pill/Patch Count: None available to be counted. Bottle Appearance: No container available. Did not bring bottle(s) to appointment. Filled Date: N/A Last Medication intake:  Today   Pharmacokinetics: Liberation and absorption (onset of action): WNL Distribution (time to peak effect): WNL Metabolism and excretion (duration of action): WNL         Pharmacodynamics: Desired effects: Analgesia: Mr. Butcher reports >50% benefit. Functional ability: Patient reports that medication allows him to accomplish basic ADLs Clinically meaningful improvement in function (CMIF): Sustained CMIF goals met Perceived effectiveness: Described as relatively effective, allowing for increase in activities of daily living (ADL) Undesirable effects: Side-effects or Adverse reactions: None reported Monitoring: [place] PMP: Online review of the past 66-monthperiod conducted. Compliant with practice rules and regulations Last UDS on record: Summary  Date Value Ref Range Status  06/08/2017 FINAL  Final    Comment:    ==================================================================== TOXASSURE SELECT 13 (MW) ==================================================================== Test  Result       Flag       Units Drug  Present and Declared for Prescription Verification   Oxycodone                      2584         EXPECTED   ng/mg creat   Oxymorphone                    1003         EXPECTED   ng/mg creat   Noroxycodone                   5533         EXPECTED   ng/mg creat   Noroxymorphone                 518          EXPECTED   ng/mg creat    Sources of oxycodone are scheduled prescription medications.    Oxymorphone, noroxycodone, and noroxymorphone are expected    metabolites of oxycodone. Oxymorphone is also available as a    scheduled prescription medication. ==================================================================== Test                      Result    Flag   Units      Ref Range   Creatinine              80               mg/dL      >=20 ==================================================================== Declared Medications:  The flagging and interpretation on this report are based on the  following declared medications.  Unexpected results may arise from  inaccuracies in the declared medications.  **Note: The testing scope of this panel includes these medications:  Oxycodone  **Note: The testing scope of this panel does not include following  reported medications:  Aspirin (Aspirin 81)  Atenolol  Chondroitin (Glucosamine-Chondroitin)  Diltiazem  Glipizide  Glucosamine (Glucosamine-Chondroitin)  Glyburide  Lisinopril  Metformin  Multivitamin  Nitroglycerin  Rosuvastatin  Sertraline  Supplement  Tizanidine  Trazodone ==================================================================== For clinical consultation, please call [phone]. ====================================================================    UDS interpretation: Compliant          Medication Assessment Form: Reviewed. Patient indicates being compliant with therapy Treatment compliance: Compliant Risk Assessment Profile: Aberrant behavior: See prior evaluations. None observed or detected today Comorbid  factors increasing risk of overdose: See prior notes. No additional risks detected today Risk of substance use disorder (SUD): Low Opioid Risk Tool - 08/18/17 1026      Family History of Substance Abuse   Alcohol  Positive Male    Illegal Drugs  Positive Male    Rx Drugs  Negative      Personal History of Substance Abuse   Alcohol  Negative    Illegal Drugs  Negative    Rx Drugs  Negative      Age   Age between 20-45 years   No      History of Preadolescent Sexual Abuse   History of Preadolescent Sexual Abuse  Negative or Male      Psychological Disease   Psychological Disease  Negative    Depression  Positive      Total Score   Opioid Risk Tool Scoring  7    Opioid Risk Interpretation  Moderate Risk      ORT Scoring interpretation table:  Score <3 = Low Risk for SUD  Score between 4-7 = Moderate Risk for SUD  Score >8 = High Risk for Opioid Abuse   Risk Mitigation Strategies:  Patient Counseling: Covered Patient-Prescriber Agreement (PPA): Present and active  Notification to other healthcare providers: Done  Pharmacologic Plan: No change in therapy, at this time.             Laboratory Chemistry  Inflammation Markers (CRP: Acute Phase) (ESR: Chronic Phase) No results found for: CRP, ESRSEDRATE, LATICACIDVEN               Rheumatology Markers No results found for: RF, ANA, Therisa Doyne, [facility]              Renal Function Markers Lab Results  Component Value Date   BUN 11 08/21/2016   CREATININE 0.92 08/21/2016   GFRAA >60 08/21/2016   GFRNONAA >60 08/21/2016                 Hepatic Function Markers Lab Results  Component Value Date   AST 23 03/04/2010   ALT 31 03/04/2010   ALBUMIN 4.7 03/04/2010   ALKPHOS 64 03/04/2010                 Electrolytes Lab Results  Component Value Date   NA 135 08/21/2016   K 3.7 08/21/2016   CL 100 (L) 08/21/2016   CALCIUM 9.4 08/21/2016                 Neuropathy Markers Lab Results   Component Value Date   HGBA1C 9.0 03/04/2010                 Bone Pathology Markers No results found for: [place], [id number], [id number], [id number], 25OHVITD1, 25OHVITD2, 25OHVITD3, TESTOFREE, TESTOSTERONE               Coagulation Parameters Lab Results  Component Value Date   PLT 219 08/21/2016                 Cardiovascular Markers Lab Results  Component Value Date   TROPONINI <0.03 08/21/2016   HGB 15.3 08/21/2016   HCT 43.6 08/21/2016                 CA Markers No results found for: CEA, CA125, LABCA2               Note: Lab results reviewed.  Recent Diagnostic Imaging Results  DG Chest 2 View CLINICAL DATA:  Acute onset of left-sided chest tightness. Initial encounter.  EXAM: CHEST  2 VIEW  COMPARISON:  None.  FINDINGS: The lungs are well-aerated. Mild bibasilar atelectasis is noted. There is no evidence of pleural effusion or pneumothorax.  The heart is normal in size; the mediastinal contour is within normal limits. No acute osseous abnormalities are seen.  IMPRESSION: Mild bibasilar atelectasis noted.  Lungs otherwise grossly clear.  Electronically Signed   By: Garald Balding M.D.   On: 08/21/2016 22:15  Complexity Note: Imaging results reviewed. Results shared with Mr. Mcphearson, using Layman's terms.                         Meds   Current Outpatient Medications:  .  aspirin 81 MG tablet, Take 162 mg by mouth daily., Disp: , Rfl:  .  atenolol (TENORMIN) 100 MG tablet, Take 50 mg by mouth 2 (two) times daily., Disp: , Rfl:  .  Cinnamon 500 MG capsule, Take  500 mg daily by mouth. , Disp: , Rfl:  .  diltiazem (DILTIAZEM CD) 240 MG 24 hr capsule, Take 240 mg by mouth daily., Disp: , Rfl:  .  glipiZIDE (GLUCOTROL) 10 MG tablet, Take 10 mg 2 (two) times daily by mouth., Disp: , Rfl: 3 .  lisinopril (PRINIVIL,ZESTRIL) 20 MG tablet, Take 20 mg by mouth daily., Disp: , Rfl:  .  metFORMIN (GLUCOPHAGE) 500 MG tablet, Take 500 mg by mouth daily with  breakfast. Reported on 02/10/2016, Disp: , Rfl:  .  Multiple Vitamins-Minerals (VITAMINS TO GO MEN PO), Take 1 tablet by mouth daily. , Disp: , Rfl:  .  nitroGLYCERIN (NITROSTAT) 0.4 MG SL tablet, Place 1 tablet under tongue as needed for chest pain (may repeat every 5 minutes but seek medical help if pain persists after 3 tablets) as needed, Disp: , Rfl:  .  rosuvastatin (CRESTOR) 10 MG tablet, Take 10 mg by mouth daily., Disp: , Rfl:  .  sertraline (ZOLOFT) 50 MG tablet, Take 100 mg by mouth at bedtime. , Disp: , Rfl:  .  tiZANidine (ZANAFLEX) 4 MG tablet, Take 1 tablet (4 mg total) by mouth daily., Disp: 30 tablet, Rfl: 1 .  TraZODone HCl 150 MG TB24, Take by mouth at bedtime., Disp: , Rfl:  .  [START ON 09/20/2017] XTAMPZA ER 36 MG C12A, Take 2 capsules (72 mg total) by mouth 2 (two) times daily., Disp: 120 each, Rfl: 0  ROS  Constitutional: Denies any fever or chills Gastrointestinal: No reported hemesis, hematochezia, vomiting, or acute GI distress Musculoskeletal: Denies any acute onset joint swelling, redness, loss of ROM, or weakness Neurological: No reported episodes of acute onset apraxia, aphasia, dysarthria, agnosia, amnesia, paralysis, loss of coordination, or loss of consciousness  Allergies  Mr. Brashier has No Known Allergies.  [place]  Drug: Mr. Chavarin  reports that he does not use drugs. Alcohol:  reports that he does not drink alcohol. Tobacco:  reports that he quit smoking about 23 years ago. He has quit using smokeless tobacco. Medical:  has a past medical history of CHF (congestive heart failure) ([place]), Depression, Diabetes mellitus without complication ([place]), GERD (gastroesophageal reflux disease), Hyperlipidemia, and Hypertension. Surgical: Mr. Zechman  has a past surgical history that includes Coronary angioplasty with stent (1996 /2003) and Appendectomy. Family: family history includes Alcohol abuse in his brother and father; Arthritis in his maternal grandfather and  mother; Asthma in his daughter; Cancer in his maternal grandfather and mother; Depression in his brother; Diabetes in his brother, maternal grandfather, and mother; Drug abuse in his daughter; Early death in his brother; Heart disease in his brother and father; Hypertension in his brother and father; Kidney disease in his brother; Mental illness in his maternal aunt; Stroke in his maternal grandmother; Varicose Veins in his mother.  Constitutional Exam  General appearance: Well nourished, well developed, and well hydrated. In no apparent acute distress Vitals:   08/18/17 1014  BP: (!) 151/85  Pulse: (!) 55  Resp: 16  Temp: 98.4 F (36.9 C)  TempSrc: Oral  SpO2: 97%  Weight: 235 lb (106.6 kg)  Height: '5\' 10"'$  (1.778 m)  Psych/Mental status: Alert, oriented x 3 (person, place, & time)       Eyes: PERLA Respiratory: No evidence of acute respiratory distress  Cervical Spine Area Exam  Skin & Axial Inspection: No masses, redness, edema, swelling, or associated skin lesions Alignment: Symmetrical Functional ROM: Unrestricted ROM      Stability: No instability detected Muscle  Tone/Strength: Functionally intact. No obvious neuro-muscular anomalies detected. Sensory (Neurological): Unimpaired Palpation: No palpable anomalies              Upper Extremity (UE) Exam    Side: Right upper extremity  Side: Left upper extremity  Skin & Extremity Inspection: Skin color, temperature, and hair growth are WNL. No peripheral edema or cyanosis. No masses, redness, swelling, asymmetry, or associated skin lesions. No contractures.  Skin & Extremity Inspection: Skin color, temperature, and hair growth are WNL. No peripheral edema or cyanosis. No masses, redness, swelling, asymmetry, or associated skin lesions. No contractures.  Functional ROM: Unrestricted ROM          Functional ROM: Unrestricted ROM          Muscle Tone/Strength: Functionally intact. No obvious neuro-muscular anomalies detected.  Muscle  Tone/Strength: Functionally intact. No obvious neuro-muscular anomalies detected.  Sensory (Neurological): Unimpaired          Sensory (Neurological): Unimpaired          Palpation: No palpable anomalies              Palpation: No palpable anomalies              Specialized Test(s): Deferred         Specialized Test(s): Deferred          Thoracic Spine Area Exam  Skin & Axial Inspection: No masses, redness, or swelling Alignment: Symmetrical Functional ROM: Unrestricted ROM Stability: No instability detected Muscle Tone/Strength: Functionally intact. No obvious neuro-muscular anomalies detected. Sensory (Neurological): Unimpaired Muscle strength & Tone: No palpable anomalies  Lumbar Spine Area Exam  Skin & Axial Inspection: No masses, redness, or swelling Alignment: Symmetrical Functional ROM: Unrestricted ROM      Stability: No instability detected Muscle Tone/Strength: Functionally intact. No obvious neuro-muscular anomalies detected. Sensory (Neurological): Unimpaired Palpation: No palpable anomalies       Provocative Tests: Lumbar Hyperextension and rotation test: evaluation deferred today       Lumbar Lateral bending test: evaluation deferred today       Patrick's Maneuver: evaluation deferred today                    Gait & Posture Assessment  Ambulation: Unassisted Gait: Relatively normal for age and body habitus Posture: WNL   Lower Extremity Exam    Side: Right lower extremity  Side: Left lower extremity  Skin & Extremity Inspection: Skin color, temperature, and hair growth are WNL. No peripheral edema or cyanosis. No masses, redness, swelling, asymmetry, or associated skin lesions. No contractures.  Skin & Extremity Inspection: Skin color, temperature, and hair growth are WNL. No peripheral edema or cyanosis. No masses, redness, swelling, asymmetry, or associated skin lesions. No contractures.  Functional ROM: Unrestricted ROM          Functional ROM: Unrestricted ROM           Muscle Tone/Strength: Functionally intact. No obvious neuro-muscular anomalies detected.  Muscle Tone/Strength: Functionally intact. No obvious neuro-muscular anomalies detected.  Sensory (Neurological): Unimpaired  Sensory (Neurological): Unimpaired  Palpation: No palpable anomalies  Palpation: No palpable anomalies   Assessment  Primary Diagnosis & Pertinent Problem List: The primary encounter diagnosis was Chronic left-sided low back pain without sciatica. Diagnoses of Chronic pain of lower extremity, unspecified laterality, Neurogenic pain, Musculoskeletal pain, and Chronic pain syndrome were also pertinent to this visit.  Status Diagnosis  Controlled Controlled Controlled 1. Chronic left-sided low back pain without sciatica   2. Chronic  pain of lower extremity, unspecified laterality   3. Neurogenic pain   4. Musculoskeletal pain   5. Chronic pain syndrome     Problems updated and reviewed during this visit: No problems updated. Plan of Care  Pharmacotherapy (Medications Ordered): Meds ordered this encounter  Medications  . tiZANidine (ZANAFLEX) 4 MG tablet    Sig: Take 1 tablet (4 mg total) by mouth daily.    Dispense:  30 tablet    Refill:  1    Do not add this medication to the electronic "Automatic Refill" notification system. Patient may have prescription filled one day early if pharmacy is closed on scheduled refill date.    Order Specific Question:   Supervising Provider    Answer:   Milinda Pointer [phone]  . XTAMPZA ER 36 MG C12A    Sig: Take 2 capsules (72 mg total) by mouth 2 (two) times daily.    Dispense:  120 each    Refill:  0    Do not fill until 09/20/2017    Order Specific Question:   Supervising Provider    Answer:   Milinda Pointer [phone]   New Prescriptions   No medications on file   Medications administered today: Lukah N. Malacara had no medications administered during this visit. Lab-work, procedure(s), and/or referral(s): No  orders of the defined types were placed in this encounter.  Imaging and/or referral(s): None  Interventional therapies: Planned, scheduled, and/or pending:   Not at this time.   Considering:   Left sided Lumbar Facet Injection    Palliative PRN treatment(s):   Not at this time   Provider-requested follow-up: Return in about 2 months (around 10/16/2017) for MedMgmt with Me Donella Stade Edison Pace).  Future Appointments  Date Time Provider [place]  10/11/2017  9:00 AM Vevelyn Francois, NP [facility] None   Primary Care Physician: Maryland Pink, MD Location: [facility] Outpatient Pain Management Facility Note by: Vevelyn Francois NP Date: 08/18/2017; Time: 1:43 PM  Pain Score Disclaimer: We use the NRS-11 scale. This is a self-reported, subjective measurement of pain severity with only modest accuracy. It is used primarily to identify changes within a particular patient. It must be understood that outpatient pain scales are significantly less accurate that those used for research, where they can be applied under ideal controlled circumstances with minimal exposure to variables. In reality, the score is likely to be a combination of pain intensity and pain affect, where pain affect describes the degree of emotional arousal or changes in action readiness caused by the sensory experience of pain. Factors such as social and work situation, setting, emotional state, anxiety levels, expectation, and prior pain experience may influence pain perception and show large inter-individual differences that may also be affected by time variables.  Patient instructions provided during this appointment: Patient Instructions    BMI Assessment: Estimated body mass index is 33.72 kg/m as calculated from the following:   Height as of this encounter: '5\' 10"'$  (1.778 m).   Weight as of this encounter: 235 lb (106.6 kg).  BMI interpretation table: BMI level Category Range association with higher incidence of chronic  pain  <18 kg/m2 Underweight   18.5-24.9 kg/m2 Ideal body weight   25-29.9 kg/m2 Overweight Increased incidence by 20%  30-34.9 kg/m2 Obese (Class I) Increased incidence by 68%  35-39.9 kg/m2 Severe obesity (Class II) Increased incidence by 136%  >40 kg/m2 Extreme obesity (Class III) Increased incidence by 254%   BMI Readings from Last 4 Encounters:  08/18/17 33.72  kg/m  06/08/17 34.44 kg/m  04/13/17 31.85 kg/m  02/11/17 31.57 kg/m   Wt Readings from Last 4 Encounters:  08/18/17 235 lb (106.6 kg)  06/08/17 240 lb (108.9 kg)  04/13/17 222 lb (100.7 kg)  02/11/17 220 lb (99.8 kg)

## 2017-08-18 NOTE — Progress Notes (Signed)
Nursing Pain Medication Assessment:  Safety precautions to be maintained throughout the outpatient stay will include: orient to surroundings, keep bed in low position, maintain call bell within reach at all times, provide assistance with transfer out of bed and ambulation.  Medication Inspection Compliance: Mr. Siddiqi did not comply with our request to bring his pills to be counted. He was reminded that bringing the medication bottles, even when empty, is a requirement.  Medication: None brought in. Pill/Patch Count: None available to be counted. Bottle Appearance: No container available. Did not bring bottle(s) to appointment. Filled Date: N/A Last Medication intake:  Today 

## 2017-08-23 ENCOUNTER — Telehealth: Payer: Self-pay | Admitting: Anesthesiology

## 2017-08-23 ENCOUNTER — Other Ambulatory Visit: Payer: Self-pay | Admitting: Nurse Practitioner

## 2017-08-23 MED ORDER — XTAMPZA ER 36 MG PO C12A: 2.0000 | EXTENDED_RELEASE_CAPSULE | Freq: Two times a day (BID) | ORAL | 0 refills | Status: DC

## 2017-08-23 NOTE — Telephone Encounter (Signed)
PA sent through covermymeds 08/23/17 Key # [id number]

## 2017-08-23 NOTE — Telephone Encounter (Signed)
Patient came in, he got script filled for 7 days meds, will need new script, he got filled on Sat 26th. Also will need authorization. Patient saw Corky Downs for meds refill last time. Patient is in Waiting Room. Please advise

## 2017-10-11 ENCOUNTER — Encounter: Payer: Self-pay | Admitting: Nurse Practitioner

## 2017-10-11 ENCOUNTER — Ambulatory Visit: Payer: Medicare Other | Attending: Nurse Practitioner | Admitting: Nurse Practitioner

## 2017-10-11 VITALS — BP 165/86 | HR 53 | Temp 97.7°F | Resp 16 | Ht 70.0 in | Wt 225.0 lb

## 2017-10-11 DIAGNOSIS — I110 Hypertensive heart disease with heart failure: Secondary | ICD-10-CM | POA: Diagnosis not present

## 2017-10-11 DIAGNOSIS — M5136 Other intervertebral disc degeneration, lumbar region: Secondary | ICD-10-CM | POA: Insufficient documentation

## 2017-10-11 DIAGNOSIS — Z79891 Long term (current) use of opiate analgesic: Secondary | ICD-10-CM | POA: Diagnosis not present

## 2017-10-11 DIAGNOSIS — M792 Neuralgia and neuritis, unspecified: Secondary | ICD-10-CM

## 2017-10-11 DIAGNOSIS — M7918 Myalgia, other site: Secondary | ICD-10-CM | POA: Diagnosis not present

## 2017-10-11 DIAGNOSIS — G8929 Other chronic pain: Secondary | ICD-10-CM

## 2017-10-11 DIAGNOSIS — Z79899 Other long term (current) drug therapy: Secondary | ICD-10-CM | POA: Insufficient documentation

## 2017-10-11 DIAGNOSIS — M51369 Other intervertebral disc degeneration, lumbar region without mention of lumbar back pain or lower extremity pain: Secondary | ICD-10-CM

## 2017-10-11 DIAGNOSIS — M545 Low back pain, unspecified: Secondary | ICD-10-CM

## 2017-10-11 DIAGNOSIS — Z7982 Long term (current) use of aspirin: Secondary | ICD-10-CM | POA: Diagnosis not present

## 2017-10-11 DIAGNOSIS — I509 Heart failure, unspecified: Secondary | ICD-10-CM | POA: Insufficient documentation

## 2017-10-11 DIAGNOSIS — Z5181 Encounter for therapeutic drug level monitoring: Secondary | ICD-10-CM | POA: Diagnosis present

## 2017-10-11 DIAGNOSIS — Z87891 Personal history of nicotine dependence: Secondary | ICD-10-CM | POA: Insufficient documentation

## 2017-10-11 DIAGNOSIS — F329 Major depressive disorder, single episode, unspecified: Secondary | ICD-10-CM | POA: Insufficient documentation

## 2017-10-11 DIAGNOSIS — E785 Hyperlipidemia, unspecified: Secondary | ICD-10-CM | POA: Diagnosis not present

## 2017-10-11 DIAGNOSIS — Z9889 Other specified postprocedural states: Secondary | ICD-10-CM | POA: Diagnosis not present

## 2017-10-11 DIAGNOSIS — Z7984 Long term (current) use of oral hypoglycemic drugs: Secondary | ICD-10-CM | POA: Insufficient documentation

## 2017-10-11 DIAGNOSIS — K219 Gastro-esophageal reflux disease without esophagitis: Secondary | ICD-10-CM | POA: Insufficient documentation

## 2017-10-11 DIAGNOSIS — E119 Type 2 diabetes mellitus without complications: Secondary | ICD-10-CM | POA: Diagnosis not present

## 2017-10-11 MED ORDER — XTAMPZA ER 36 MG PO C12A: 2.0000 | EXTENDED_RELEASE_CAPSULE | Freq: Two times a day (BID) | ORAL | 0 refills | Status: DC

## 2017-10-11 MED ORDER — TIZANIDINE HCL 4 MG PO TABS: 4.0000 mg | ORAL_TABLET | Freq: Two times a day (BID) | ORAL | 1 refills | Status: DC

## 2017-10-11 NOTE — Patient Instructions (Addendum)
____________________________________________________________________________________________  Medication Rules  Applies to: All patients receiving prescriptions (written or electronic).  Pharmacy of record: Pharmacy where electronic prescriptions will be sent. If written prescriptions are taken to a different pharmacy, please inform the nursing staff. The pharmacy listed in the electronic medical record should be the one where you would like electronic prescriptions to be sent.  Prescription refills: Only during scheduled appointments. Applies to both, written and electronic prescriptions.  NOTE: The following applies primarily to controlled substances (Opioid* Pain Medications).   Patient's responsibilities: 1. Pain Pills: Bring all pain pills to every appointment (except for procedure appointments). 2. Pill Bottles: Bring pills in original pharmacy bottle. Always bring newest bottle. Bring bottle, even if empty. 3. Medication refills: You are responsible for knowing and keeping track of what medications you need refilled. The day before your appointment, write a list of all prescriptions that need to be refilled. Bring that list to your appointment and give it to the admitting nurse. Prescriptions will be written only during appointments. If you forget a medication, it will not be "Called in", "Faxed", or "electronically sent". You will need to get another appointment to get these prescribed. 4. Prescription Accuracy: You are responsible for carefully inspecting your prescriptions before leaving our office. Have the discharge nurse carefully go over each prescription with you, before taking them home. Make sure that your name is accurately spelled, that your address is correct. Check the name and dose of your medication to make sure it is accurate. Check the number of pills, and the written instructions to make sure they are clear and accurate. Make sure that you are given enough medication to last  until your next medication refill appointment. 5. Taking Medication: Take medication as prescribed. Never take more pills than instructed. Never take medication more frequently than prescribed. Taking less pills or less frequently is permitted and encouraged, when it comes to controlled substances (written prescriptions).  6. Inform other Doctors: Always inform, all of your healthcare providers, of all the medications you take. 7. Pain Medication from other Providers: You are not allowed to accept any additional pain medication from any other Doctor or Healthcare provider. There are two exceptions to this rule. (see below) In the event that you require additional pain medication, you are responsible for notifying us, as stated below. 8. Medication Agreement: You are responsible for carefully reading and following our Medication Agreement. This must be signed before receiving any prescriptions from our practice. Safely store a copy of your signed Agreement. Violations to the Agreement will result in no further prescriptions. (Additional copies of our Medication Agreement are available upon request.) 9. Laws, Rules, & Regulations: All patients are expected to follow all Federal and State Laws, Statutes, Rules, & Regulations. Ignorance of the Laws does not constitute a valid excuse. The use of any illegal substances is prohibited. 10. Adopted CDC guidelines & recommendations: Target dosing levels will be at or below 60 MME/day. Use of benzodiazepines** is not recommended.  Exceptions: There are only two exceptions to the rule of not receiving pain medications from other Healthcare Providers. 1. Exception #1 (Emergencies): In the event of an emergency (i.e.: accident requiring emergency care), you are allowed to receive additional pain medication. However, you are responsible for: As soon as you are able, call our office [phone], at any time of the day or night, and leave a message stating your name, the  date and nature of the emergency, and the name and dose of the medication   prescribed. In the event that your call is answered by a member of our staff, make sure to document and save the date, time, and the name of the person that took your information.  2. Exception #2 (Planned Surgery): In the event that you are scheduled by another doctor or dentist to have any type of surgery or procedure, you are allowed (for a period no longer than 30 days), to receive additional pain medication, for the acute post-op pain. However, in this case, you are responsible for picking up a copy of our "Post-op Pain Management for Surgeons" handout, and giving it to your surgeon or dentist. This document is available at our office, and does not require an appointment to obtain it. Simply go to our office during business hours (Monday-Thursday from 8:00 AM to 4:00 PM) (Friday 8:00 AM to 12:00 Noon) or if you have a scheduled appointment with [place], prior to your surgery, and ask for it by name. In addition, you will need to provide [place] with your name, name of your surgeon, type of surgery, and date of procedure or surgery.  *Opioid medications include: morphine, codeine, oxycodone, oxymorphone, hydrocodone, hydromorphone, meperidine, tramadol, tapentadol, buprenorphine, fentanyl, methadone. **Benzodiazepine medications include: diazepam (Valium), alprazolam (Xanax), clonazepam (Klonopine), lorazepam (Ativan), clorazepate (Tranxene), chlordiazepoxide (Librium), estazolam (Prosom), oxazepam (Serax), temazepam (Restoril), triazolam (Halcion) (Last updated: 09/23/2017) ____________________________________________________________________________________________    BMI interpretation table: BMI level Category Range association with higher incidence of chronic pain  <18 kg/m2 Underweight   18.5-24.9 kg/m2 Ideal body weight   25-29.9 kg/m2 Overweight Increased incidence by 20%  30-34.9 kg/m2 Obese (Class I) Increased incidence by  68%  35-39.9 kg/m2 Severe obesity (Class II) Increased incidence by 136%  >40 kg/m2 Extreme obesity (Class III) Increased incidence by 254%   BMI Readings from Last 4 Encounters:  10/11/17 32.28 kg/m  08/18/17 33.72 kg/m  06/08/17 34.44 kg/m  04/13/17 31.85 kg/m   Wt Readings from Last 4 Encounters:  10/11/17 225 lb (102.1 kg)  08/18/17 235 lb (106.6 kg)  06/08/17 240 lb (108.9 kg)  04/13/17 222 lb (100.7 kg)

## 2017-10-11 NOTE — Progress Notes (Signed)
Nursing Pain Medication Assessment:  Safety precautions to be maintained throughout the outpatient stay will include: orient to surroundings, keep bed in low position, maintain call bell within reach at all times, provide assistance with transfer out of bed and ambulation.  Medication Inspection Compliance: Pill count conducted under aseptic conditions, in front of the patient. Neither the pills nor the bottle was removed from the patient's sight at any time. Once count was completed pills were immediately returned to the patient in their original bottle.  Medication: Xtampsa Pill/Patch Count: 22 of 120 pills remain Pill/Patch Appearance: Markings consistent with prescribed medication Bottle Appearance: Standard pharmacy container. Clearly labeled. Filled Date: 02 / 25 / 2019 Last Medication intake:  Today

## 2017-10-11 NOTE — Progress Notes (Signed)
Patient's Name: Peter Lamb  MRN: [id number]  Referring Provider: Maryland Pink, MD  DOB: August 21, 1955  PCP: Maryland Pink, MD  DOS: 10/11/2017  Note by: Vevelyn Francois NP  Service setting: Ambulatory outpatient  Specialty: Interventional Pain Management  Location: [facility] (AMB) Pain Management Facility    Patient type: Established    Primary Reason(s) for Visit: Encounter for prescription drug management. (Level of risk: moderate)  CC: Back Pain (right lower)  HPI  Peter Lamb is a 62 y.o. year old, male patient, who comes today for a medication management evaluation. He has DM; GOUT; DEPRESSION; CAD; Chronic pain; Long term current use of opiate analgesic; Long term prescription opiate use; Opiate use; Encounter for therapeutic drug level monitoring; Arteriosclerosis of coronary artery; Type 2 diabetes mellitus ([place]); HLD (hyperlipidemia); BP (high blood pressure); Chronic low back pain; Chronic pain of lower extremity; Musculoskeletal pain; Neurogenic pain; and DDD (degenerative disc disease), lumbar on their problem list. His primarily concern today is the Back Pain (right lower)  Pain Assessment: Location: Lower, left Back Radiating: na Onset: More than a month ago Duration: Chronic pain Quality: Constant, Discomfort Severity: 4 /10 (self-reported pain score)  Note: Reported level is compatible with observation.                          Effect on ADL: stays at home mostly d/t the pain. Timing: Constant Modifying factors: medications, patches  [name] was last scheduled for an appointment on 08/18/2017 for medication management. During today's appointment we reviewed [name] chronic pain status, as well as his outpatient medication regimen. He states that if he is not having pain. He states that the pain medication is not working 3 times per day. He states that it worked for a while. He states that this is why he is short on his medication. He states that his last procedure did  not go well. He states that he his legs were moving involuntarily. He has not tired any additional procedures since then,. He admits that he has weakness in his left leg. He states that he has had a fall 5 days ago. He denies that it was relate to his leg. He states that fell out of the bed while sitting on the edge asleep he admits that this was on 2 occasions.   The patient  reports that he does not use drugs. His body mass index is 32.28 kg/m.  Further details on both, my assessment(s), as well as the proposed treatment plan, please see below.  Controlled Substance Pharmacotherapy Assessment REMS (Risk Evaluation and Mitigation Strategy)  Analgesic: Xtampza 36 mg 2 tab twice daily (oxycodone 144 mg per day) MME/day: 254m/day.   PJanett Billow RN  10/11/2017  9:41 AM  Sign at close encounter Nursing Pain Medication Assessment:  Safety precautions to be maintained throughout the outpatient stay will include: orient to surroundings, keep bed in low position, maintain call bell within reach at all times, provide assistance with transfer out of bed and ambulation.  Medication Inspection Compliance: Pill count conducted under aseptic conditions, in front of the patient. Neither the pills nor the bottle was removed from the patient's sight at any time. Once count was completed pills were immediately returned to the patient in their original bottle.  Medication: Xtampza Pill/Patch Count: 22 of 120 pills remain Pill/Patch Appearance: Markings consistent with prescribed medication Bottle Appearance: Standard pharmacy container. Clearly labeled. Filled Date: 02 / 25 / 2019  Last Medication intake:  Today Pharmacokinetics: Liberation and absorption (onset of action): WNL Distribution (time to peak effect): WNL Metabolism and excretion (duration of action): WNL         Pharmacodynamics: Desired effects: Analgesia: [name] reports >50% benefit. Functional ability: Patient reports that  medication allows him to accomplish basic ADLs Clinically meaningful improvement in function (CMIF): Sustained CMIF goals met Perceived effectiveness: Described as relatively effective, allowing for increase in activities of daily living (ADL) Undesirable effects: Side-effects or Adverse reactions: None reported Monitoring: [place] PMP: Online review of the past 46-monthperiod conducted. Compliant with practice rules and regulations Last UDS on record: Summary  Date Value Ref Range Status  06/08/2017 FINAL  Final    Comment:    ==================================================================== TOXASSURE SELECT 13 (MW) ==================================================================== Test                             Result       Flag       Units Drug Present and Declared for Prescription Verification   Oxycodone                      2584         EXPECTED   ng/mg creat   Oxymorphone                    1003         EXPECTED   ng/mg creat   Noroxycodone                   5533         EXPECTED   ng/mg creat   Noroxymorphone                 518          EXPECTED   ng/mg creat    Sources of oxycodone are scheduled prescription medications.    Oxymorphone, noroxycodone, and noroxymorphone are expected    metabolites of oxycodone. Oxymorphone is also available as a    scheduled prescription medication. ==================================================================== Test                      Result    Flag   Units      Ref Range   Creatinine              80               mg/dL      >=20 ==================================================================== Declared Medications:  The flagging and interpretation on this report are based on the  following declared medications.  Unexpected results may arise from  inaccuracies in the declared medications.  **Note: The testing scope of this panel includes these medications:  Oxycodone  **Note: The testing scope of this panel does not include  following  reported medications:  Aspirin (Aspirin 81)  Atenolol  Chondroitin (Glucosamine-Chondroitin)  Diltiazem  Glipizide  Glucosamine (Glucosamine-Chondroitin)  Glyburide  Lisinopril  Metformin  Multivitamin  Nitroglycerin  Rosuvastatin  Sertraline  Supplement  Tizanidine  Trazodone ==================================================================== For clinical consultation, please call [phone] ====================================================================    UDS interpretation: Compliant          Medication Assessment Form: Reviewed. Patient indicates being compliant with therapy Treatment compliance: Deficiencies noted and steps taken to remind the patient of the seriousness of adequate therapy compliance Risk Assessment Profile: Aberrant behavior: See prior evaluations. None observed  or detected today Comorbid factors increasing risk of overdose: See prior notes. No additional risks detected today Risk of substance use disorder (SUD): Low Opioid Risk Tool - 08/18/17 1026      Family History of Substance Abuse   Alcohol  Positive Male    Illegal Drugs  Positive Male    Rx Drugs  Negative      Personal History of Substance Abuse   Alcohol  Negative    Illegal Drugs  Negative    Rx Drugs  Negative      Age   Age between 64-45 years   No      History of Preadolescent Sexual Abuse   History of Preadolescent Sexual Abuse  Negative or Male      Psychological Disease   Psychological Disease  Negative    Depression  Positive      Total Score   Opioid Risk Tool Scoring  7    Opioid Risk Interpretation  Moderate Risk      ORT Scoring interpretation table:  Score <3 = Low Risk for SUD  Score between 4-7 = Moderate Risk for SUD  Score >8 = High Risk for Opioid Abuse   Risk Mitigation Strategies:  Patient Counseling: Covered Patient-Prescriber Agreement (PPA): Present and active  Notification to other healthcare providers:  Done  Pharmacologic Plan: No change in therapy, at this time.             Laboratory Chemistry  Inflammation Markers (CRP: Acute Phase) (ESR: Chronic Phase) No results found for: CRP, ESRSEDRATE, LATICACIDVEN                       Rheumatology Markers No results found for: RF, ANA, Therisa Doyne, [facility]              Renal Function Markers Lab Results  Component Value Date   BUN 11 08/21/2016   CREATININE 0.92 08/21/2016   GFRAA >60 08/21/2016   GFRNONAA >60 08/21/2016                 Hepatic Function Markers Lab Results  Component Value Date   AST 23 03/04/2010   ALT 31 03/04/2010   ALBUMIN 4.7 03/04/2010   ALKPHOS 64 03/04/2010                 Electrolytes Lab Results  Component Value Date   NA 135 08/21/2016   K 3.7 08/21/2016   CL 100 (L) 08/21/2016   CALCIUM 9.4 08/21/2016                        Neuropathy Markers Lab Results  Component Value Date   HGBA1C 9.0 03/04/2010                 Bone Pathology Markers No results found for: [place], [id number], EX5170YF7, CB4496PR9, 25OHVITD1, 25OHVITD2, 25OHVITD3, TESTOFREE, TESTOSTERONE                       Coagulation Parameters Lab Results  Component Value Date   PLT 219 08/21/2016                 Cardiovascular Markers Lab Results  Component Value Date   TROPONINI <0.03 08/21/2016   HGB 15.3 08/21/2016   HCT 43.6 08/21/2016                 CA Markers No results found for: CEA, CA125, LABCA2  Note: Lab results reviewed.  Recent Diagnostic Imaging Results  DG Chest 2 View CLINICAL DATA:  Acute onset of left-sided chest tightness. Initial encounter.  EXAM: CHEST  2 VIEW  COMPARISON:  None.  FINDINGS: The lungs are well-aerated. Mild bibasilar atelectasis is noted. There is no evidence of pleural effusion or pneumothorax.  The heart is normal in size; the mediastinal contour is within normal limits. No acute osseous abnormalities are  seen.  IMPRESSION: Mild bibasilar atelectasis noted.  Lungs otherwise grossly clear.  Electronically Signed   By: Garald Balding M.D.   On: 08/21/2016 22:15  Complexity Note: Imaging results reviewed. Results shared with Mr. Richert, using Layman's terms.                         Meds   Current Outpatient Medications:  .  aspirin 81 MG tablet, Take 162 mg by mouth daily., Disp: , Rfl:  .  atenolol (TENORMIN) 100 MG tablet, Take 50 mg by mouth 2 (two) times daily., Disp: , Rfl:  .  Cinnamon 500 MG capsule, Take 500 mg daily by mouth. , Disp: , Rfl:  .  diltiazem (DILTIAZEM CD) 240 MG 24 hr capsule, Take 240 mg by mouth daily., Disp: , Rfl:  .  glipiZIDE (GLUCOTROL) 10 MG tablet, Take 10 mg 2 (two) times daily by mouth., Disp: , Rfl: 3 .  lisinopril (PRINIVIL,ZESTRIL) 20 MG tablet, Take 20 mg by mouth daily., Disp: , Rfl:  .  metFORMIN (GLUCOPHAGE) 500 MG tablet, Take 500 mg by mouth daily with breakfast. Reported on 02/10/2016, Disp: , Rfl:  .  Multiple Vitamins-Minerals (VITAMINS TO GO MEN PO), Take 1 tablet by mouth daily. , Disp: , Rfl:  .  nitroGLYCERIN (NITROSTAT) 0.4 MG SL tablet, Place 1 tablet under tongue as needed for chest pain (may repeat every 5 minutes but seek medical help if pain persists after 3 tablets) as needed, Disp: , Rfl:  .  rosuvastatin (CRESTOR) 10 MG tablet, Take 10 mg by mouth daily., Disp: , Rfl:  .  sertraline (ZOLOFT) 50 MG tablet, Take 100 mg by mouth at bedtime. , Disp: , Rfl:  .  tiZANidine (ZANAFLEX) 4 MG tablet, Take 1 tablet (4 mg total) by mouth 2 (two) times daily., Disp: 60 tablet, Rfl: 1 .  TraZODone HCl 150 MG TB24, Take by mouth at bedtime., Disp: , Rfl:  .  [START ON 11/19/2017] XTAMPZA ER 36 MG C12A, Take 2 capsules (72 mg total) by mouth 2 (two) times daily., Disp: 120 capsule, Rfl: 0 .  sertraline (ZOLOFT) 100 MG tablet, Take 200 mg by mouth daily., Disp: , Rfl: 0  ROS  Constitutional: Denies any fever or chills Gastrointestinal: No reported  hemesis, hematochezia, vomiting, or acute GI distress Musculoskeletal: Denies any acute onset joint swelling, redness, loss of ROM, or weakness Neurological: No reported episodes of acute onset apraxia, aphasia, dysarthria, agnosia, amnesia, paralysis, loss of coordination, or loss of consciousness  Allergies  Mr. Griesinger has No Known Allergies.  [place]  Drug: Mr. Bitner  reports that he does not use drugs. Alcohol:  reports that he does not drink alcohol. Tobacco:  reports that he quit smoking about 23 years ago. He has quit using smokeless tobacco. Medical:  has a past medical history of CHF (congestive heart failure) ([place]), Depression, Diabetes mellitus without complication ([place]), GERD (gastroesophageal reflux disease), Hyperlipidemia, and Hypertension. Surgical: Mr. Radin  has a past surgical history that includes Coronary angioplasty with  stent (1996 /2003) and Appendectomy. Family: family history includes Alcohol abuse in his brother and father; Arthritis in his maternal grandfather and mother; Asthma in his daughter; Cancer in his maternal grandfather and mother; Depression in his brother; Diabetes in his brother, maternal grandfather, and mother; Drug abuse in his daughter; Early death in his brother; Heart disease in his brother and father; Hypertension in his brother and father; Kidney disease in his brother; Mental illness in his maternal aunt; Stroke in his maternal grandmother; Varicose Veins in his mother.  Constitutional Exam  General appearance: Well nourished, well developed, and well hydrated. In no apparent acute distress Vitals:   10/11/17 0931  BP: (!) 165/86  Pulse: (!) 53  Resp: 16  Temp: 97.7 F (36.5 C)  TempSrc: Oral  SpO2: 97%  Weight: 225 lb (102.1 kg)  Height: '5\' 10"'  (1.778 m)   BMI Assessment: Estimated body mass index is 32.28 kg/m as calculated from the following:   Height as of this encounter: '5\' 10"'  (1.778 m).   Weight as of this encounter: 225 lb  (102.1 kg). Psych/Mental status: Alert, oriented x 3 (person, place, & time)       Eyes: PERLA Respiratory: No evidence of acute respiratory distress  Lumbar Spine Area Exam  Skin & Axial Inspection: No masses, redness, or swelling Alignment: Symmetrical Functional ROM: Unrestricted ROM      Stability: No instability detected Muscle Tone/Strength: Functionally intact. No obvious neuro-muscular anomalies detected. Sensory (Neurological): Unimpaired Palpation: No palpable anomalies       Provocative Tests: Lumbar Hyperextension and rotation test: evaluation deferred today       Lumbar Lateral bending test: evaluation deferred today       Patrick's Maneuver: evaluation deferred today                    Gait & Posture Assessment  Ambulation: Unassisted Gait: Relatively normal for age and body habitus Posture: WNL   Lower Extremity Exam    Side: Right lower extremity  Side: Left lower extremity  Skin & Extremity Inspection: Skin color, temperature, and hair growth are WNL. No peripheral edema or cyanosis. No masses, redness, swelling, asymmetry, or associated skin lesions. No contractures.  Skin & Extremity Inspection: Skin color, temperature, and hair growth are WNL. No peripheral edema or cyanosis. No masses, redness, swelling, asymmetry, or associated skin lesions. No contractures.  Functional ROM: Unrestricted ROM          Functional ROM: Unrestricted ROM          Muscle Tone/Strength: Functionally intact. No obvious neuro-muscular anomalies detected.  Muscle Tone/Strength: Functionally intact. No obvious neuro-muscular anomalies detected.  Sensory (Neurological): Unimpaired  Sensory (Neurological): Unimpaired  Palpation: No palpable anomalies  Palpation: No palpable anomalies   Assessment  Primary Diagnosis & Pertinent Problem List: The primary encounter diagnosis was Chronic left-sided low back pain without sciatica. Diagnoses of Musculoskeletal pain, DDD (degenerative disc  disease), lumbar, Neurogenic pain, and Long term current use of opiate analgesic were also pertinent to this visit.  Status Diagnosis  Not responding Controlled Not responding 1. Chronic left-sided low back pain without sciatica   2. Musculoskeletal pain   3. DDD (degenerative disc disease), lumbar   4. Neurogenic pain   5. Long term current use of opiate analgesic     Problems updated and reviewed during this visit: No problems updated. Plan of Care  Pharmacotherapy (Medications Ordered): Meds ordered this encounter  Medications  . DISCONTD: XTAMPZA ER 36 MG  C12A    Sig: Take 2 capsules (72 mg total) by mouth 2 (two) times daily.    Dispense:  120 capsule    Refill:  0    Do not fill until 10/20/2017    Order Specific Question:   Supervising Provider    Answer:   Milinda Pointer [phone]  . tiZANidine (ZANAFLEX) 4 MG tablet    Sig: Take 1 tablet (4 mg total) by mouth 2 (two) times daily.    Dispense:  60 tablet    Refill:  1    Do not add this medication to the electronic "Automatic Refill" notification system. Patient may have prescription filled one day early if pharmacy is closed on scheduled refill date.    Order Specific Question:   Supervising Provider    Answer:   Milinda Pointer [phone]  . XTAMPZA ER 36 MG C12A    Sig: Take 2 capsules (72 mg total) by mouth 2 (two) times daily.    Dispense:  120 capsule    Refill:  0    Do not fill until 11/19/2017    Order Specific Question:   Supervising Provider    Answer:   Milinda Pointer [phone]   New Prescriptions   No medications on file   Medications administered today: Aiken N. Faulcon had no medications administered during this visit. Lab-work, procedure(s), and/or referral(s): Orders Placed This Encounter  Procedures  . ToxASSURE Select 13 (MW), Urine   Imaging and/or referral(s): None  Interventional therapies: Planned, scheduled, and/or pending:   Not at this time.   Considering: Left sided  Lumbar Facet Injection   Palliative PRN treatment(s):   Not at this time   Provider-requested follow-up: Return in about 2 months (around 12/11/2017) for w/ Dr. Andree Elk.  Future Appointments  Date Time Provider [place]  12/10/2017  1:15 PM Molli Barrows, MD [facility] None   Primary Care Physician: Maryland Pink, MD Location: [facility] Outpatient Pain Management Facility Note by: Vevelyn Francois NP Date: 10/11/2017; Time: 1:28 PM  Pain Score Disclaimer: We use the NRS-11 scale. This is a self-reported, subjective measurement of pain severity with only modest accuracy. It is used primarily to identify changes within a particular patient. It must be understood that outpatient pain scales are significantly less accurate that those used for research, where they can be applied under ideal controlled circumstances with minimal exposure to variables. In reality, the score is likely to be a combination of pain intensity and pain affect, where pain affect describes the degree of emotional arousal or changes in action readiness caused by the sensory experience of pain. Factors such as social and work situation, setting, emotional state, anxiety levels, expectation, and prior pain experience may influence pain perception and show large inter-individual differences that may also be affected by time variables.  Patient instructions provided during this appointment: Patient Instructions   ____________________________________________________________________________________________  Medication Rules  Applies to: All patients receiving prescriptions (written or electronic).  Pharmacy of record: Pharmacy where electronic prescriptions will be sent. If written prescriptions are taken to a different pharmacy, please inform the nursing staff. The pharmacy listed in the electronic medical record should be the one where you would like electronic prescriptions to be sent.  Prescription refills: Only during  scheduled appointments. Applies to both, written and electronic prescriptions.  NOTE: The following applies primarily to controlled substances (Opioid* Pain Medications).   Patient's responsibilities: 1. Pain Pills: Bring all pain pills to every appointment (except for procedure appointments). 2. Pill  Bottles: Bring pills in original pharmacy bottle. Always bring newest bottle. Bring bottle, even if empty. 3. Medication refills: You are responsible for knowing and keeping track of what medications you need refilled. The day before your appointment, write a list of all prescriptions that need to be refilled. Bring that list to your appointment and give it to the admitting nurse. Prescriptions will be written only during appointments. If you forget a medication, it will not be "Called in", "Faxed", or "electronically sent". You will need to get another appointment to get these prescribed. 4. Prescription Accuracy: You are responsible for carefully inspecting your prescriptions before leaving our office. Have the discharge nurse carefully go over each prescription with you, before taking them home. Make sure that your name is accurately spelled, that your address is correct. Check the name and dose of your medication to make sure it is accurate. Check the number of pills, and the written instructions to make sure they are clear and accurate. Make sure that you are given enough medication to last until your next medication refill appointment. 5. Taking Medication: Take medication as prescribed. Never take more pills than instructed. Never take medication more frequently than prescribed. Taking less pills or less frequently is permitted and encouraged, when it comes to controlled substances (written prescriptions).  6. Inform other Doctors: Always inform, all of your healthcare providers, of all the medications you take. 7. Pain Medication from other Providers: You are not allowed to accept any additional pain  medication from any other Doctor or Healthcare provider. There are two exceptions to this rule. (see below) In the event that you require additional pain medication, you are responsible for notifying us, as stated below. 8. Medication Agreement: You are responsible for carefully reading and following our Medication Agreement. This must be signed before receiving any prescriptions from our practice. Safely store a copy of your signed Agreement. Violations to the Agreement will result in no further prescriptions. (Additional copies of our Medication Agreement are available upon request.) 9. Laws, Rules, & Regulations: All patients are expected to follow all Federal and [employer], [employer], Rules, [employer]. Ignorance of the Laws does not constitute a valid excuse. The use of any illegal substances is prohibited. 10. Adopted CDC guidelines & recommendations: Target dosing levels will be at or below 60 MME/day. Use of benzodiazepines** is not recommended.  Exceptions: There are only two exceptions to the rule of not receiving pain medications from other Healthcare Providers. 1. Exception #1 (Emergencies): In the event of an emergency (i.e.: accident requiring emergency care), you are allowed to receive additional pain medication. However, you are responsible for: As soon as you are able, call our office [phone], at any time of the day or night, and leave a message stating your name, the date and nature of the emergency, and the name and dose of the medication prescribed. In the event that your call is answered by a member of our staff, make sure to document and save the date, time, and the name of the person that took your information.  2. Exception #2 (Planned Surgery): In the event that you are scheduled by another doctor or dentist to have any type of surgery or procedure, you are allowed (for a period no longer than 30 days), to receive additional pain medication, for the acute post-op pain.  However, in this case, you are responsible for picking up a copy of our "Post-op Pain Management for Surgeons" handout, and giving it to your  surgeon or dentist. This document is available at our office, and does not require an appointment to obtain it. Simply go to our office during business hours (Monday-Thursday from 8:00 AM to 4:00 PM) (Friday 8:00 AM to 12:00 Noon) or if you have a scheduled appointment with [place], prior to your surgery, and ask for it by name. In addition, you will need to provide [place] with your name, name of your surgeon, type of surgery, and date of procedure or surgery.  *Opioid medications include: morphine, codeine, oxycodone, oxymorphone, hydrocodone, hydromorphone, meperidine, tramadol, tapentadol, buprenorphine, fentanyl, methadone. **Benzodiazepine medications include: diazepam (Valium), alprazolam (Xanax), clonazepam (Klonopine), lorazepam (Ativan), clorazepate (Tranxene), chlordiazepoxide (Librium), estazolam (Prosom), oxazepam (Serax), temazepam (Restoril), triazolam (Halcion) (Last updated: 09/23/2017) ____________________________________________________________________________________________    BMI interpretation table: BMI level Category Range association with higher incidence of chronic pain  <18 kg/m2 Underweight   18.5-24.9 kg/m2 Ideal body weight   25-29.9 kg/m2 Overweight Increased incidence by 20%  30-34.9 kg/m2 Obese (Class I) Increased incidence by 68%  35-39.9 kg/m2 Severe obesity (Class II) Increased incidence by 136%  >40 kg/m2 Extreme obesity (Class III) Increased incidence by 254%   BMI Readings from Last 4 Encounters:  10/11/17 32.28 kg/m  08/18/17 33.72 kg/m  06/08/17 34.44 kg/m  04/13/17 31.85 kg/m   Wt Readings from Last 4 Encounters:  10/11/17 225 lb (102.1 kg)  08/18/17 235 lb (106.6 kg)  06/08/17 240 lb (108.9 kg)  04/13/17 222 lb (100.7 kg)

## 2017-10-15 LAB — TOXASSURE SELECT 13 (MW), URINE

## 2017-11-19 ENCOUNTER — Telehealth: Payer: Self-pay | Admitting: Nurse Practitioner

## 2017-11-19 ENCOUNTER — Other Ambulatory Visit: Payer: Self-pay | Admitting: Nurse Practitioner

## 2017-11-19 DIAGNOSIS — R200 Anesthesia of skin: Secondary | ICD-10-CM

## 2017-11-19 NOTE — Telephone Encounter (Signed)
Pharmacy needs to verify script before filling

## 2017-11-19 NOTE — Telephone Encounter (Signed)
Pharmacy  Verified script as sent.

## 2017-11-24 ENCOUNTER — Ambulatory Visit
Admission: RE | Admit: 2017-11-24 | Discharge: 2017-11-24 | Disposition: A | Discharge status: Home / Self Care | Payer: Medicare Other | Source: Ambulatory Visit | Attending: Nurse Practitioner | Admitting: Nurse Practitioner

## 2017-11-24 DIAGNOSIS — R200 Anesthesia of skin: Secondary | ICD-10-CM | POA: Insufficient documentation

## 2017-11-24 DIAGNOSIS — R202 Paresthesia of skin: Secondary | ICD-10-CM | POA: Insufficient documentation

## 2017-11-24 DIAGNOSIS — M4802 Spinal stenosis, cervical region: Secondary | ICD-10-CM | POA: Diagnosis not present

## 2017-11-24 DIAGNOSIS — M720 Palmar fascial fibromatosis [Dupuytren]: Secondary | ICD-10-CM | POA: Diagnosis present

## 2017-12-10 ENCOUNTER — Encounter: Payer: Self-pay | Admitting: Anesthesiology

## 2017-12-10 ENCOUNTER — Other Ambulatory Visit: Payer: Self-pay

## 2017-12-10 ENCOUNTER — Ambulatory Visit: Payer: Medicare Other | Attending: Anesthesiology | Admitting: Anesthesiology

## 2017-12-10 VITALS — BP 159/96 | HR 56 | Resp 18 | Ht 70.0 in | Wt 220.0 lb

## 2017-12-10 DIAGNOSIS — M5116 Intervertebral disc disorders with radiculopathy, lumbar region: Secondary | ICD-10-CM | POA: Diagnosis not present

## 2017-12-10 DIAGNOSIS — Z79899 Other long term (current) drug therapy: Secondary | ICD-10-CM | POA: Insufficient documentation

## 2017-12-10 DIAGNOSIS — M5136 Other intervertebral disc degeneration, lumbar region: Secondary | ICD-10-CM

## 2017-12-10 DIAGNOSIS — M5442 Lumbago with sciatica, left side: Secondary | ICD-10-CM | POA: Diagnosis not present

## 2017-12-10 DIAGNOSIS — M7918 Myalgia, other site: Secondary | ICD-10-CM | POA: Diagnosis not present

## 2017-12-10 DIAGNOSIS — Z7984 Long term (current) use of oral hypoglycemic drugs: Secondary | ICD-10-CM | POA: Diagnosis not present

## 2017-12-10 DIAGNOSIS — Z7982 Long term (current) use of aspirin: Secondary | ICD-10-CM | POA: Diagnosis not present

## 2017-12-10 DIAGNOSIS — G894 Chronic pain syndrome: Secondary | ICD-10-CM | POA: Diagnosis not present

## 2017-12-10 DIAGNOSIS — F329 Major depressive disorder, single episode, unspecified: Secondary | ICD-10-CM | POA: Diagnosis not present

## 2017-12-10 DIAGNOSIS — M545 Low back pain, unspecified: Secondary | ICD-10-CM

## 2017-12-10 DIAGNOSIS — M51369 Other intervertebral disc degeneration, lumbar region without mention of lumbar back pain or lower extremity pain: Secondary | ICD-10-CM

## 2017-12-10 DIAGNOSIS — M792 Neuralgia and neuritis, unspecified: Secondary | ICD-10-CM | POA: Diagnosis not present

## 2017-12-10 DIAGNOSIS — G8929 Other chronic pain: Secondary | ICD-10-CM

## 2017-12-10 MED ORDER — TIZANIDINE HCL 4 MG PO TABS: 4.0000 mg | ORAL_TABLET | Freq: Two times a day (BID) | ORAL | 3 refills | Status: DC

## 2017-12-10 MED ORDER — XTAMPZA ER 36 MG PO C12A: 2.0000 | EXTENDED_RELEASE_CAPSULE | Freq: Two times a day (BID) | ORAL | 0 refills | Status: DC

## 2017-12-10 MED ORDER — XTAMPZA ER 36 MG PO C12A: 2.0000 | EXTENDED_RELEASE_CAPSULE | Freq: Two times a day (BID) | ORAL | 0 refills | Status: AC

## 2017-12-10 NOTE — Progress Notes (Signed)
Nursing Pain Medication Assessment:  Safety precautions to be maintained throughout the outpatient stay will include: orient to surroundings, keep bed in low position, maintain call bell within reach at all times, provide assistance with transfer out of bed and ambulation.  Medication Inspection Compliance: Pill count conducted under aseptic conditions, in front of the patient. Neither the pills nor the bottle was removed from the patient's sight at any time. Once count was completed pills were immediately returned to the patient in their original bottle.  Medication: xtampza Pill/Patch Count: 34 of 120 pills remain Pill/Patch Appearance: Markings consistent with prescribed medication Bottle Appearance: Standard pharmacy container. Clearly labeled. Filled Date: 04/ 26/ 2019 Last Medication intake:  Today

## 2017-12-10 NOTE — Progress Notes (Signed)
Subjective:  Patient ID: Peter Lamb, male    DOB: 1955-11-17  Age: 62 y.o. MRN: [id number]  CC: Back Pain (lower)   Procedure: None  HPI OREN BARELLA presents for reevaluation.  Record was last seen approximately 2 months ago and is been doing well with his current regimen.  He does feel that he has been somewhat fatigued as of late.  This is not unusual for him however he is not had any problems with blacking out passing out or lightheadedness when going from seated to standing.  Based on his narcotic assessment sheet he continues to derive good functional lifestyle improvement with his medication.  Otherwise he is in his usual state of health and no new changes in his lower extremity strength or function or bowel bladder function.  He has been taking his medications as prescribed and tolerating these well.  Unfortunately his pain has been quite recalcitrant and failed conservative therapy.  He is getting a bit more lower extremity left greater than right calf and leg pain.  His bowel bladder function is been stable.  Outpatient Medications Prior to Visit  Medication Sig Dispense Refill  . aspirin 81 MG tablet Take 162 mg by mouth daily.    Marland Kitchen atenolol (TENORMIN) 100 MG tablet Take 50 mg by mouth 2 (two) times daily.    . Cinnamon 500 MG capsule Take 500 mg daily by mouth.     . diltiazem (DILTIAZEM CD) 240 MG 24 hr capsule Take 240 mg by mouth daily.    Marland Kitchen glipiZIDE (GLUCOTROL) 10 MG tablet Take 10 mg 2 (two) times daily by mouth.  3  . lisinopril (PRINIVIL,ZESTRIL) 20 MG tablet Take 20 mg by mouth daily.    . metFORMIN (GLUCOPHAGE) 500 MG tablet Take 500 mg by mouth daily with breakfast. Reported on 02/10/2016    . Multiple Vitamins-Minerals (VITAMINS TO GO MEN PO) Take 1 tablet by mouth daily.     . nitroGLYCERIN (NITROSTAT) 0.4 MG SL tablet Place 1 tablet under tongue as needed for chest pain (may repeat every 5 minutes but seek medical help if pain persists after 3 tablets) as  needed    . rosuvastatin (CRESTOR) 10 MG tablet Take 10 mg by mouth daily.    . sertraline (ZOLOFT) 100 MG tablet Take 200 mg by mouth daily.  0  . sertraline (ZOLOFT) 50 MG tablet Take 100 mg by mouth at bedtime.     . TraZODone HCl 150 MG TB24 Take by mouth at bedtime.    Marland Kitchen tiZANidine (ZANAFLEX) 4 MG tablet Take 1 tablet (4 mg total) by mouth 2 (two) times daily. 60 tablet 1  . XTAMPZA ER 36 MG C12A Take 2 capsules (72 mg total) by mouth 2 (two) times daily. 120 capsule 0   No facility-administered medications prior to visit.     Review of Systems CNS: No confusion or sedation Cardiac: No angina or palpitations GI: No abdominal pain or constipation Constitutional: No nausea vomiting fevers or chills  Objective:  BP (!) 159/96   Pulse (!) 56   Resp 18   Ht 5\' 10"  (1.778 m)   Wt 220 lb (99.8 kg)   SpO2 98%   BMI 31.57 kg/m    BP Readings from Last 3 Encounters:  12/10/17 (!) 159/96  10/11/17 (!) 165/86  08/18/17 (!) 151/85     Wt Readings from Last 3 Encounters:  12/10/17 220 lb (99.8 kg)  10/11/17 225 lb (102.1 kg)  08/18/17 235  lb (106.6 kg)     Physical Exam Pt is alert and oriented PERRL EOMI HEART IS RRR no murmur or rub LCTA no wheezing or rales MUSCULOSKELETAL reveals some paraspinous muscle tenderness but no overt trigger points.  He does have a positive straight leg raise on the left side with a antalgic gait and his muscle tone and bulk is at baseline.  Labs  Lab Results  Component Value Date   HGBA1C 9.0 03/04/2010   Lab Results  Component Value Date   CREATININE 0.92 08/21/2016    -------------------------------------------------------------------------------------------------------------------- Lab Results  Component Value Date   WBC 9.2 08/21/2016   HGB 15.3 08/21/2016   HCT 43.6 08/21/2016   PLT 219 08/21/2016   GLUCOSE 132 (H) 08/21/2016   ALT 31 03/04/2010   AST 23 03/04/2010   NA 135 08/21/2016   K 3.7 08/21/2016   CL 100 (L)  08/21/2016   CREATININE 0.92 08/21/2016   BUN 11 08/21/2016   CO2 26 08/21/2016   PSA 0.26 03/04/2010   HGBA1C 9.0 03/04/2010    --------------------------------------------------------------------------------------------------------------------- Mr Cervical Spine Wo Contrast  Result Date: 11/25/2017 CLINICAL DATA:  Neck pain radiating to both upper extremities. EXAM: MRI CERVICAL SPINE WITHOUT CONTRAST TECHNIQUE: Multiplanar, multisequence MR imaging of the cervical spine was performed. No intravenous contrast was administered. COMPARISON:  None. FINDINGS: Alignment: Normal. Vertebrae: No focal marrow lesion. No compression fracture or evidence of discitis osteomyelitis. Cord: Normal caliber and signal. Posterior Fossa, vertebral arteries, paraspinal tissues: Visualized posterior fossa is normal. Vertebral artery flow voids are preserved. No prevertebral effusion. Disc levels: Mild C1-C2 degenerative hypertrophy. The C2-T1 levels are described individually below. C2-C3: Normal. C3-C4: Right-sided osteophyte with small central disc protrusion. Left facet hypertrophy. No spinal canal or neural foraminal stenosis. C4-C5: Left facet hypertrophy and minimal disc bulge without spinal canal stenosis. Mild left foraminal stenosis. C5-C6: Small disc bulge.  No stenosis. C6-C7: Mild disc bulge, left eccentric. Mild left foraminal narrowing. No spinal canal stenosis. C7-T1: Normal. T1-T2 is imaged in the sagittal plane only, with no visible disc herniation or stenosis. IMPRESSION: 1. Mild left neural foraminal stenosis at C4-C5 and C6-C7. 2. No spinal canal stenosis. Electronically Signed   By: Ulyses Jarred M.D.   On: 11/25/2017 02:57     Assessment & Plan:   Narek was seen today for back pain.  Diagnoses and all orders for this visit:  Musculoskeletal pain -     tiZANidine (ZANAFLEX) 4 MG tablet; Take 1 tablet (4 mg total) by mouth 2 (two) times daily.  Chronic left-sided low back pain without  sciatica  DDD (degenerative disc disease), lumbar -     Lumbar Epidural Injection; Future  Neurogenic pain  Chronic pain syndrome  Chronic left-sided low back pain with left-sided sciatica -     Lumbar Epidural Injection; Future  Other orders -     Discontinue: XTAMPZA ER 36 MG C12A; Take 2 capsules (72 mg total) by mouth 2 (two) times daily. -     XTAMPZA ER 36 MG C12A; Take 2 capsules (72 mg total) by mouth 2 (two) times daily.        ----------------------------------------------------------------------------------------------------------------------  Problem List Items Addressed This Visit      Unprioritized   Chronic low back pain (Chronic)   Relevant Medications   XTAMPZA ER 36 MG C12A   tiZANidine (ZANAFLEX) 4 MG tablet   Other Relevant Orders   Lumbar Epidural Injection   Chronic pain (Chronic)   Relevant Medications   XTAMPZA  ER 36 MG C12A   tiZANidine (ZANAFLEX) 4 MG tablet   DDD (degenerative disc disease), lumbar   Relevant Medications   XTAMPZA ER 36 MG C12A   tiZANidine (ZANAFLEX) 4 MG tablet   Other Relevant Orders   Lumbar Epidural Injection   Musculoskeletal pain - Primary (Chronic)   Relevant Medications   tiZANidine (ZANAFLEX) 4 MG tablet   Neurogenic pain (Chronic)        ----------------------------------------------------------------------------------------------------------------------  1. Musculoskeletal pain We will refill his tizanidine for twice a day dosing.  We have gone over the risks and benefits of this dosing as well.  We have also talked about possible changes in his medication management but secondary to his depression I am concerned about backing off of any of the medications used by his primary care physicians for this treatment. - tiZANidine (ZANAFLEX) 4 MG tablet; Take 1 tablet (4 mg total) by mouth 2 (two) times daily.  Dispense: 60 tablet; Refill: 3  2. Chronic left-sided low back pain without sciatica I am scheduling  him for return to clinic in approximately 1 to 2 months for possible epidural steroid injection to see if this can help with some of the sciatica-like symptoms he is experiencing.  This can also possibly reduce how much medication he is requiring opioid wise  3. DDD (degenerative disc disease), lumbar As above - Lumbar Epidural Injection; Future  4. Neurogenic pain As above  5. Chronic pain syndrome As above.  We have reviewed the [facility] practitioner database information and it is appropriate.  Refills will be given for his xtampza ER for 526 and 625  6. Chronic left-sided low back pain with left-sided sciatica As above - Lumbar Epidural Injection; Future    ----------------------------------------------------------------------------------------------------------------------  I am having Giovonnie N. Hoe maintain his aspirin, metFORMIN, sertraline, atenolol, diltiazem, lisinopril, Cinnamon, Multiple Vitamins-Minerals (VITAMINS TO GO MEN PO), rosuvastatin, TraZODone HCl, glipiZIDE, nitroGLYCERIN, sertraline, XTAMPZA ER, and tiZANidine.   Meds ordered this encounter  Medications  . DISCONTD: XTAMPZA ER 36 MG C12A    Sig: Take 2 capsules (72 mg total) by mouth 2 (two) times daily.    Dispense:  120 capsule    Refill:  0    Do not fill until 12/19/2017  . XTAMPZA ER 36 MG C12A    Sig: Take 2 capsules (72 mg total) by mouth 2 (two) times daily.    Dispense:  120 capsule    Refill:  0    Do not fill until 01/18/2018  . tiZANidine (ZANAFLEX) 4 MG tablet    Sig: Take 1 tablet (4 mg total) by mouth 2 (two) times daily.    Dispense:  60 tablet    Refill:  3    Do not add this medication to the electronic "Automatic Refill" notification system. Patient may have prescription filled one day early if pharmacy is closed on scheduled refill date.   Patient's Medications  New Prescriptions   No medications on file  Previous Medications   ASPIRIN 81 MG TABLET    Take 162 mg by  mouth daily.   ATENOLOL (TENORMIN) 100 MG TABLET    Take 50 mg by mouth 2 (two) times daily.   CINNAMON 500 MG CAPSULE    Take 500 mg daily by mouth.    DILTIAZEM (DILTIAZEM CD) 240 MG 24 HR CAPSULE    Take 240 mg by mouth daily.   GLIPIZIDE (GLUCOTROL) 10 MG TABLET    Take 10 mg 2 (two) times daily by  mouth.   LISINOPRIL (PRINIVIL,ZESTRIL) 20 MG TABLET    Take 20 mg by mouth daily.   METFORMIN (GLUCOPHAGE) 500 MG TABLET    Take 500 mg by mouth daily with breakfast. Reported on 02/10/2016   MULTIPLE VITAMINS-MINERALS (VITAMINS TO GO MEN PO)    Take 1 tablet by mouth daily.    NITROGLYCERIN (NITROSTAT) 0.4 MG SL TABLET    Place 1 tablet under tongue as needed for chest pain (may repeat every 5 minutes but seek medical help if pain persists after 3 tablets) as needed   ROSUVASTATIN (CRESTOR) 10 MG TABLET    Take 10 mg by mouth daily.   SERTRALINE (ZOLOFT) 100 MG TABLET    Take 200 mg by mouth daily.   SERTRALINE (ZOLOFT) 50 MG TABLET    Take 100 mg by mouth at bedtime.    TRAZODONE HCL 150 MG TB24    Take by mouth at bedtime.  Modified Medications   Modified Medication Previous Medication   TIZANIDINE (ZANAFLEX) 4 MG TABLET tiZANidine (ZANAFLEX) 4 MG tablet      Take 1 tablet (4 mg total) by mouth 2 (two) times daily.    Take 1 tablet (4 mg total) by mouth 2 (two) times daily.   XTAMPZA ER 36 MG C12A XTAMPZA ER 36 MG C12A      Take 2 capsules (72 mg total) by mouth 2 (two) times daily.    Take 2 capsules (72 mg total) by mouth 2 (two) times daily.  Discontinued Medications   No medications on file   ----------------------------------------------------------------------------------------------------------------------  Follow-up: Return in about 2 months (around 02/09/2018) for evaluation, med refill, procedure.    Molli Barrows, MD

## 2017-12-10 NOTE — Patient Instructions (Addendum)
You have been given 2 scripts for xtampsa today    Preparing for your procedure (without sedation) Instructions: . Oral Intake: Do not eat or drink anything for at least 3 hours prior to your procedure. . Transportation: Unless otherwise stated by your physician, you may drive yourself after the procedure. . Blood Pressure Medicine: Take your blood pressure medicine with a sip of water the morning of the procedure. . Insulin: Take only  of your normal insulin dose. . Preventing infections: Shower with an antibacterial soap the morning of your procedure. . Build-up your immune system: Take 1000 mg of Vitamin C with every meal (3 times a day) the day prior to your procedure. . Pregnancy: If you are pregnant, call and cancel the procedure. . Sickness: If you have a cold, fever, or any active infections, call and cancel the procedure. . Arrival: You must be in the facility at least 30 minutes prior to your scheduled procedure. . Children: Do not bring any children with you. . Dress appropriately: Bring dark clothing that you would not mind if they get stained. . Valuables: Do not bring any jewelry or valuables. Procedure appointments are reserved for interventional treatments only. Marland Kitchen No Prescription Refills. . No medication changes will be discussed during procedure appointments. No disability issues will be discussed.Epidural Steroid Injection Patient Information  Description: The epidural space surrounds the nerves as they exit the spinal cord.  In some patients, the nerves can be compressed and inflamed by a bulging disc or a tight spinal canal (spinal stenosis).  By injecting steroids into the epidural space, we can bring irritated nerves into direct contact with a potentially helpful medication.  These steroids act directly on the irritated nerves and can reduce swelling and inflammation which often leads to decreased pain.  Epidural steroids may be injected anywhere along the spine and  from the neck to the low back depending upon the location of your pain.   After numbing the skin with local anesthetic (like Novocaine), a small needle is passed into the epidural space slowly.  You may experience a sensation of pressure while this is being done.  The entire block usually last less than 10 minutes.  Conditions which may be treated by epidural steroids:  Low back and leg pain Neck and arm pain Spinal stenosis Post-laminectomy syndrome Herpes zoster (shingles) pain Pain from compression fractures  Preparation for the injection:  Do not eat any solid food or dairy products within 8 hours of your appointment.  You may drink clear liquids up to 3 hours before appointment.  Clear liquids include water, black coffee, juice or soda.  No milk or cream please. You may take your regular medication, including pain medications, with a sip of water before your appointment  Diabetics should hold regular insulin (if taken separately) and take 1/2 normal NPH dos the morning of the procedure.  Carry some sugar containing items with you to your appointment. A driver must accompany you and be prepared to drive you home after your procedure.  Bring all your current medications with your. An IV may be inserted and sedation may be given at the discretion of the physician.   A blood pressure cuff, EKG and other monitors will often be applied during the procedure.  Some patients may need to have extra oxygen administered for a short period. You will be asked to provide medical information, including your allergies, prior to the procedure.  We must know immediately if you are taking blood thinners (  like Coumadin/Warfarin)  Or if you are allergic to IV iodine contrast (dye). We must know if you could possible be pregnant.  Possible side-effects: Bleeding from needle site Infection (rare, may require surgery) Nerve injury (rare) Numbness & tingling (temporary) Difficulty urinating (rare,  temporary) Spinal headache ( a headache worse with upright posture) Light -headedness (temporary) Pain at injection site (several days) Decreased blood pressure (temporary) Weakness in arm/leg (temporary) Pressure sensation in back/neck (temporary)  Call if you experience: Fever/chills associated with headache or increased back/neck pain. Headache worsened by an upright position. New onset weakness or numbness of an extremity below the injection site Hives or difficulty breathing (go to the emergency room) Inflammation or drainage at the infection site Severe back/neck pain Any new symptoms which are concerning to you  Please note:  Although the local anesthetic injected can often make your back or neck feel good for several hours after the injection, the pain will likely return.  It takes 3-7 days for steroids to work in the epidural space.  You may not notice any pain relief for at least that one week.  If effective, we will often do a series of three injections spaced 3-6 weeks apart to maximally decrease your pain.  After the initial series, we generally will wait several months before considering a repeat injection of the same type.  If you have any questions, please call [phone] . [facility]

## 2017-12-24 ENCOUNTER — Telehealth: Payer: Self-pay | Admitting: Anesthesiology

## 2017-12-24 NOTE — Telephone Encounter (Signed)
Patient states pharmacy did not have enough meds to completely fill script. Needs new script to finish off month. Please call patient to discuss

## 2017-12-24 NOTE — Telephone Encounter (Signed)
Patient states he went to [facility] [address] on 12/19/17 and got 70 out of 120 filled of the [place]. Marland Kitchen

## 2017-12-27 NOTE — Telephone Encounter (Signed)
Spoke with pharmacist and she states that yes the patient did get #70 filled on 12-19-17.  This pharmacist states that he would not ned another script to get the rest filled.  Notified patient.  Patient states that he was told that he would need another prescription.  Instructed patient to call us if there was a problem getting the remaining #50 of the [place].

## 2018-01-14 DIAGNOSIS — M720 Palmar fascial fibromatosis [Dupuytren]: Secondary | ICD-10-CM | POA: Insufficient documentation

## 2018-01-14 DIAGNOSIS — G5601 Carpal tunnel syndrome, right upper limb: Secondary | ICD-10-CM | POA: Insufficient documentation

## 2018-02-08 ENCOUNTER — Encounter: Payer: Self-pay | Admitting: *Deleted

## 2018-02-08 ENCOUNTER — Other Ambulatory Visit: Payer: Self-pay

## 2018-02-09 ENCOUNTER — Encounter: Payer: Self-pay | Admitting: Anesthesiology

## 2018-02-09 ENCOUNTER — Ambulatory Visit
Admission: RE | Admit: 2018-02-09 | Discharge: 2018-02-09 | Disposition: A | Discharge status: Home / Self Care | Payer: Medicare Other | Source: Ambulatory Visit | Attending: Anesthesiology | Admitting: Anesthesiology

## 2018-02-09 ENCOUNTER — Other Ambulatory Visit: Payer: Self-pay | Admitting: Anesthesiology

## 2018-02-09 ENCOUNTER — Ambulatory Visit (HOSPITAL_BASED_OUTPATIENT_CLINIC_OR_DEPARTMENT_OTHER): Payer: Medicare Other | Admitting: Anesthesiology

## 2018-02-09 ENCOUNTER — Other Ambulatory Visit: Payer: Self-pay

## 2018-02-09 VITALS — BP 147/91 | HR 58 | Temp 98.0°F | Resp 12 | Ht 70.0 in | Wt 222.0 lb

## 2018-02-09 DIAGNOSIS — R52 Pain, unspecified: Secondary | ICD-10-CM

## 2018-02-09 DIAGNOSIS — M5136 Other intervertebral disc degeneration, lumbar region: Secondary | ICD-10-CM

## 2018-02-09 DIAGNOSIS — M47816 Spondylosis without myelopathy or radiculopathy, lumbar region: Secondary | ICD-10-CM

## 2018-02-09 DIAGNOSIS — G894 Chronic pain syndrome: Secondary | ICD-10-CM | POA: Insufficient documentation

## 2018-02-09 DIAGNOSIS — G8929 Other chronic pain: Secondary | ICD-10-CM

## 2018-02-09 DIAGNOSIS — Z79899 Other long term (current) drug therapy: Secondary | ICD-10-CM | POA: Insufficient documentation

## 2018-02-09 DIAGNOSIS — M5116 Intervertebral disc disorders with radiculopathy, lumbar region: Secondary | ICD-10-CM | POA: Diagnosis not present

## 2018-02-09 DIAGNOSIS — M5442 Lumbago with sciatica, left side: Secondary | ICD-10-CM

## 2018-02-09 DIAGNOSIS — Z7984 Long term (current) use of oral hypoglycemic drugs: Secondary | ICD-10-CM | POA: Diagnosis not present

## 2018-02-09 DIAGNOSIS — M4716 Other spondylosis with myelopathy, lumbar region: Secondary | ICD-10-CM | POA: Insufficient documentation

## 2018-02-09 DIAGNOSIS — Z7982 Long term (current) use of aspirin: Secondary | ICD-10-CM | POA: Insufficient documentation

## 2018-02-09 DIAGNOSIS — Z79891 Long term (current) use of opiate analgesic: Secondary | ICD-10-CM

## 2018-02-09 DIAGNOSIS — M545 Low back pain: Secondary | ICD-10-CM | POA: Diagnosis present

## 2018-02-09 MED ORDER — IOPAMIDOL (ISOVUE-M 200) INJECTION 41%
20.0000 mL | Freq: Once | INTRAMUSCULAR | Status: DC | PRN
  Administered 2018-02-09: 20 mL
  Filled 2018-02-09: qty 20

## 2018-02-09 MED ORDER — ROPIVACAINE HCL 2 MG/ML IJ SOLN
10.0000 mL | Freq: Once | INTRAMUSCULAR | Status: AC
  Administered 2018-02-09: 10 mL via EPIDURAL

## 2018-02-09 MED ORDER — LIDOCAINE HCL (PF) 1 % IJ SOLN
INTRAMUSCULAR | Status: AC
  Filled 2018-02-09: qty 5

## 2018-02-09 MED ORDER — SODIUM CHLORIDE 0.9% FLUSH
10.0000 mL | Freq: Once | INTRAVENOUS | Status: AC
  Administered 2018-02-09: 10 mL

## 2018-02-09 MED ORDER — ROPIVACAINE HCL 2 MG/ML IJ SOLN
INTRAMUSCULAR | Status: AC
  Filled 2018-02-09: qty 10

## 2018-02-09 MED ORDER — LIDOCAINE HCL (PF) 1 % IJ SOLN
5.0000 mL | Freq: Once | INTRAMUSCULAR | Status: AC
  Administered 2018-02-09: 5 mL via SUBCUTANEOUS

## 2018-02-09 MED ORDER — SODIUM CHLORIDE 0.9 % IJ SOLN
INTRAMUSCULAR | Status: AC
  Filled 2018-02-09: qty 10

## 2018-02-09 MED ORDER — IOPAMIDOL (ISOVUE-M 200) INJECTION 41%
INTRAMUSCULAR | Status: AC
  Filled 2018-02-09: qty 10

## 2018-02-09 MED ORDER — TRIAMCINOLONE ACETONIDE 40 MG/ML IJ SUSP
40.0000 mg | Freq: Once | INTRAMUSCULAR | Status: AC
  Administered 2018-02-09: 40 mg

## 2018-02-09 MED ORDER — TRIAMCINOLONE ACETONIDE 40 MG/ML IJ SUSP
INTRAMUSCULAR | Status: AC
  Filled 2018-02-09: qty 1

## 2018-02-09 MED ORDER — OXYCODONE ER 36 MG PO C12A: 72.0000 mg | EXTENDED_RELEASE_CAPSULE | Freq: Two times a day (BID) | ORAL | 0 refills | Status: DC

## 2018-02-09 NOTE — Progress Notes (Signed)
Subjective:  Patient ID: Peter Lamb, male    DOB: 1956-02-05  Age: 62 y.o. MRN: [id number]  CC: Back Pain (lower)   Procedure: Total epidural steroid No. 1 with no sedation  HPI Peter Lamb presents for reevaluation.  He was last seen several weeks ago and continues to have pain primarily left side with left lower leg pain affecting the base of the lower back with radiation left hip and down the posterior leg into the calf.  He has had some chronic intermittent weakness in the left leg and no problems with recent bowel or bladder dysfunction.  Otherwise he is in his usual state of health today.  He is taking his medications as prescribed and these are continue to work well for him.  Based on his narcotic assessment sheet he continues to derive good functional lifestyle improvement with the medications.  Outpatient Medications Prior to Visit  Medication Sig Dispense Refill  . APPLE CIDER VINEGAR PO Take 480 mg by mouth daily.    Marland Kitchen aspirin 81 MG tablet Take 162 mg by mouth daily.    Marland Kitchen atenolol (TENORMIN) 100 MG tablet Take 50 mg by mouth 2 (two) times daily.    Marland Kitchen diltiazem (DILTIAZEM CD) 240 MG 24 hr capsule Take 240 mg by mouth daily.    Marland Kitchen glipiZIDE (GLUCOTROL) 10 MG tablet Take 10 mg 2 (two) times daily by mouth.  3  . lisinopril (PRINIVIL,ZESTRIL) 20 MG tablet Take 20 mg by mouth daily.    . Melatonin 10 MG TABS Take by mouth at bedtime as needed.    . metFORMIN (GLUCOPHAGE) 500 MG tablet Take 500 mg by mouth daily with breakfast. Reported on 02/10/2016    . Multiple Vitamins-Minerals (VITAMINS TO GO MEN PO) Take 1 tablet by mouth daily.     . Omega-3 Fatty Acids (FISH OIL) 1000 MG CAPS Take by mouth daily.    . rosuvastatin (CRESTOR) 10 MG tablet Take 10 mg by mouth daily.    . sildenafil (REVATIO) 20 MG tablet Take 20 mg by mouth as needed (1-5  PO prn).    . TraZODone HCl 150 MG TB24 Take by mouth at bedtime.    Marland Kitchen VITAMIN E PO Take 90 Units by mouth daily.    Marland Kitchen oxyCODONE ER  (XTAMPZA ER) 36 MG C12A Take 72 mg by mouth 2 (two) times daily.    . nitroGLYCERIN (NITROSTAT) 0.4 MG SL tablet Place 1 tablet under tongue as needed for chest pain (may repeat every 5 minutes but seek medical help if pain persists after 3 tablets) as needed    . sertraline (ZOLOFT) 100 MG tablet Take 200 mg by mouth daily.  0  . sertraline (ZOLOFT) 50 MG tablet Take 100 mg by mouth at bedtime.     Marland Kitchen tiZANidine (ZANAFLEX) 4 MG tablet Take 1 tablet (4 mg total) by mouth 2 (two) times daily. 60 tablet 3   No facility-administered medications prior to visit.     Review of Systems CNS: No confusion or sedation Cardiac: No angina or palpitations GI: No abdominal pain or constipation Restitution: No nausea vomiting fevers or chills.  Objective:  BP (!) 147/91   Pulse (!) 58   Temp 98 F (36.7 C)   Resp 12   Ht 5\' 10"  (1.778 m)   Wt 222 lb (100.7 kg)   SpO2 98%   BMI 31.85 kg/m    BP Readings from Last 3 Encounters:  02/09/18 (!) 147/91  12/10/17 Marland Kitchen)  159/96  10/11/17 (!) 165/86     Wt Readings from Last 3 Encounters:  02/09/18 222 lb (100.7 kg)  12/10/17 220 lb (99.8 kg)  10/11/17 225 lb (102.1 kg)     Physical Exam Pt is alert and oriented PERRL EOMI HEART IS RRR no murmur or rub LCTA no wheezing or rales MUSCULOSKELETAL inspection of low back reveals a small 1 cm long by half centimeter wide pustule overlying the low lumbar region.  There is no evidence of any cellulitis but slight erythema approximately 1 cm to the right and left to this.  He has been wearing a brace in that region.  His muscle tone and bulk is good and he walks with an antalgic gait as per baseline.  Labs  Lab Results  Component Value Date   HGBA1C 9.0 03/04/2010   Lab Results  Component Value Date   CREATININE 0.92 08/21/2016    -------------------------------------------------------------------------------------------------------------------- Lab Results  Component Value Date   WBC 9.2  08/21/2016   HGB 15.3 08/21/2016   HCT 43.6 08/21/2016   PLT 219 08/21/2016   GLUCOSE 132 (H) 08/21/2016   ALT 31 03/04/2010   AST 23 03/04/2010   NA 135 08/21/2016   K 3.7 08/21/2016   CL 100 (L) 08/21/2016   CREATININE 0.92 08/21/2016   BUN 11 08/21/2016   CO2 26 08/21/2016   PSA 0.26 03/04/2010   HGBA1C 9.0 03/04/2010    --------------------------------------------------------------------------------------------------------------------- Dg C-arm 1-60 Min-no Report  Result Date: 02/09/2018 Fluoroscopy was utilized by the requesting physician.  No radiographic interpretation.     Assessment & Plan:   Peter Lamb was seen today for back pain.  Diagnoses and all orders for this visit:  Chronic left-sided low back pain with left-sided sciatica -     Lumbar Epidural Injection; Future -     Lumbar Epidural Injection  Chronic pain syndrome  DDD (degenerative disc disease), lumbar -     Lumbar Epidural Injection; Future -     Lumbar Epidural Injection  Long term current use of opiate analgesic  Facet arthritis of lumbar region  Lumbar spondylosis with myelopathy -     Lumbar Epidural Injection; Future -     Lumbar Epidural Injection  Other orders -     oxyCODONE ER (XTAMPZA ER) 36 MG C12A; Take 2 capsules (72 mg total) by mouth 2 (two) times daily. -     triamcinolone acetonide (KENALOG-40) injection 40 mg -     sodium chloride flush (NS) 0.9 % injection 10 mL -     ropivacaine (PF) 2 mg/mL (0.2%) (NAROPIN) injection 10 mL -     lidocaine (PF) (XYLOCAINE) 1 % injection 5 mL -     iopamidol (ISOVUE-M) 41 % intrathecal injection 20 mL        ----------------------------------------------------------------------------------------------------------------------  Problem List Items Addressed This Visit      Unprioritized   Chronic low back pain - Primary (Chronic)   Relevant Medications   oxyCODONE ER (XTAMPZA ER) 36 MG C12A   triamcinolone acetonide (KENALOG-40)  injection 40 mg   Other Relevant Orders   Lumbar Epidural Injection   Chronic pain (Chronic)   Relevant Medications   oxyCODONE ER (XTAMPZA ER) 36 MG C12A   triamcinolone acetonide (KENALOG-40) injection 40 mg   ropivacaine (PF) 2 mg/mL (0.2%) (NAROPIN) injection 10 mL   lidocaine (PF) (XYLOCAINE) 1 % injection 5 mL   DDD (degenerative disc disease), lumbar   Relevant Medications   oxyCODONE ER (XTAMPZA ER) 36 MG  C12A   triamcinolone acetonide (KENALOG-40) injection 40 mg   Other Relevant Orders   Lumbar Epidural Injection   Long term current use of opiate analgesic (Chronic)    Other Visit Diagnoses    Facet arthritis of lumbar region       Relevant Medications   oxyCODONE ER (XTAMPZA ER) 36 MG C12A   triamcinolone acetonide (KENALOG-40) injection 40 mg   Lumbar spondylosis with myelopathy       Relevant Orders   Lumbar Epidural Injection        ----------------------------------------------------------------------------------------------------------------------  1. Chronic left-sided low back pain with left-sided sciatica We did initially plan on a lumbar epidural steroid however for conservative reasons we will defer to a caudal approach.  There is no evidence of any systemic infection just some local cutaneous involvement at the L4 or L5 level.  For maximum conservative therapy we will avoid this today.  Furthermore secondary to his history of diabetes and when to start him on some Keflex 500 mg tablets 1 4 times daily for 10 days with return to clinic after today's injection, in 1 month for a repeat epidural at that time.  We have gone over the procedure with him in full detail all questions been answered risks and benefits of been reviewed and accepted. - Lumbar Epidural Injection; Future - Lumbar Epidural Injection  2. Chronic pain syndrome We will also refill his medication today.  This will be for date 725.  He did forget his medicines today so we will not give a second  refill.  We will expect a pill count at his next visit.  In 1 month.  Have reviewed the [facility] practitioner database information and it is appropriate.  3. DDD (degenerative disc disease), lumbar As above - Lumbar Epidural Injection; Future - Lumbar Epidural Injection  4. Long term current use of opiate analgesic As above  5. Facet arthritis of lumbar region As above  6. Lumbar spondylosis with myelopathy As above - Lumbar Epidural Injection; Future - Lumbar Epidural Injection    ----------------------------------------------------------------------------------------------------------------------  I have changed Peter Lamb's oxyCODONE ER. I am also having him maintain his aspirin, metFORMIN, sertraline, atenolol, diltiazem, lisinopril, Multiple Vitamins-Minerals (VITAMINS TO GO MEN PO), rosuvastatin, TraZODone HCl, glipiZIDE, nitroGLYCERIN, sertraline, tiZANidine, sildenafil, Fish Oil, APPLE CIDER VINEGAR PO, VITAMIN E PO, and Melatonin.   Meds ordered this encounter  Medications  . oxyCODONE ER (XTAMPZA ER) 36 MG C12A    Sig: Take 2 capsules (72 mg total) by mouth 2 (two) times daily.    Dispense:  60 capsule    Refill:  0    Do not fill until [id number]  . triamcinolone acetonide (KENALOG-40) injection 40 mg  . sodium chloride flush (NS) 0.9 % injection 10 mL  . ropivacaine (PF) 2 mg/mL (0.2%) (NAROPIN) injection 10 mL  . lidocaine (PF) (XYLOCAINE) 1 % injection 5 mL  . iopamidol (ISOVUE-M) 41 % intrathecal injection 20 mL   Patient's Medications  New Prescriptions   No medications on file  Previous Medications   APPLE CIDER VINEGAR PO    Take 480 mg by mouth daily.   ASPIRIN 81 MG TABLET    Take 162 mg by mouth daily.   ATENOLOL (TENORMIN) 100 MG TABLET    Take 50 mg by mouth 2 (two) times daily.   DILTIAZEM (DILTIAZEM CD) 240 MG 24 HR CAPSULE    Take 240 mg by mouth daily.   GLIPIZIDE (GLUCOTROL) 10 MG TABLET    Take  10 mg 2 (two) times daily by mouth.    LISINOPRIL (PRINIVIL,ZESTRIL) 20 MG TABLET    Take 20 mg by mouth daily.   MELATONIN 10 MG TABS    Take by mouth at bedtime as needed.   METFORMIN (GLUCOPHAGE) 500 MG TABLET    Take 500 mg by mouth daily with breakfast. Reported on 02/10/2016   MULTIPLE VITAMINS-MINERALS (VITAMINS TO GO MEN PO)    Take 1 tablet by mouth daily.    NITROGLYCERIN (NITROSTAT) 0.4 MG SL TABLET    Place 1 tablet under tongue as needed for chest pain (may repeat every 5 minutes but seek medical help if pain persists after 3 tablets) as needed   OMEGA-3 FATTY ACIDS (FISH OIL) 1000 MG CAPS    Take by mouth daily.   ROSUVASTATIN (CRESTOR) 10 MG TABLET    Take 10 mg by mouth daily.   SERTRALINE (ZOLOFT) 100 MG TABLET    Take 200 mg by mouth daily.   SERTRALINE (ZOLOFT) 50 MG TABLET    Take 100 mg by mouth at bedtime.    SILDENAFIL (REVATIO) 20 MG TABLET    Take 20 mg by mouth as needed (1-5  PO prn).   TIZANIDINE (ZANAFLEX) 4 MG TABLET    Take 1 tablet (4 mg total) by mouth 2 (two) times daily.   TRAZODONE HCL 150 MG TB24    Take by mouth at bedtime.   VITAMIN E PO    Take 90 Units by mouth daily.  Modified Medications   Modified Medication Previous Medication   OXYCODONE ER (XTAMPZA ER) 36 MG C12A oxyCODONE ER (XTAMPZA ER) 36 MG C12A      Take 2 capsules (72 mg total) by mouth 2 (two) times daily.    Take 72 mg by mouth 2 (two) times daily.  Discontinued Medications   No medications on file   ----------------------------------------------------------------------------------------------------------------------  Follow-up: Return for evaluation, procedure.  After informed consent was obtained and the risks benefits reviewed patient chose to pursue a caudal epidural steroid injection today. With the patient in the prone position no IV in place. Using AP and lateral fluoroscopic guidance I identified the area overlying the sacral hiatus. This area was broadly prepped with DuraPrep 3 and we utilized strict aseptic technique  during the procedure. 1% lidocaine was infiltrated 2 cc using a 25-gauge needle overlying the sacral cornu and then using lateral fluoroscopic guidance I advanced an 18-gauge Touhy needle approximately 2 cm through the sacral hiatus. Confirmation was with 2 cc of Isovue yielding good epidural spread and no evidence of IV or subarachnoid uptake. This was followed by an injection of 6 cc of saline mixed with 2 cc of ropivacaine 0.2% and 40 mg of triamcinolone. This was tolerated without difficulty the patient was convalesced and discharged home in stable condition for follow-up as mentioned. Colman Cater, MD

## 2018-02-09 NOTE — Progress Notes (Signed)
Safety precautions to be maintained throughout the outpatient stay will include: orient to surroundings, keep bed in low position, maintain call bell within reach at all times, provide assistance with transfer out of bed and ambulation.  

## 2018-02-09 NOTE — Patient Instructions (Signed)
____________________________________________________________________________________________  Post-Procedure Discharge Instructions  Instructions:  Apply ice: Fill a plastic sandwich bag with crushed ice. Cover it with a small towel and apply to injection site. Apply for 15 minutes then remove x 15 minutes. Repeat sequence on day of procedure, until you go to bed. The purpose is to minimize swelling and discomfort after procedure.  Apply heat: Apply heat to procedure site starting the day following the procedure. The purpose is to treat any soreness and discomfort from the procedure.  Food intake: Start with clear liquids (like water) and advance to regular food, as tolerated.   Physical activities: Keep activities to a minimum for the first 8 hours after the procedure.   Driving: If you have received any sedation, you are not allowed to drive for 24 hours after your procedure.  Blood thinner: Restart your blood thinner 6 hours after your procedure. (Only for those taking blood thinners)  Insulin: As soon as you can eat, you may resume your normal dosing schedule. (Only for those taking insulin)  Infection prevention: Keep procedure site clean and dry.  Post-procedure Pain Diary: Extremely important that this be done correctly and accurately. Recorded information will be used to determine the next step in treatment.  Pain evaluated is that of treated area only. Do not include pain from an untreated area.  Complete every hour, on the hour, for the initial 8 hours. Set an alarm to help you do this part accurately.  Do not go to sleep and have it completed later. It will not be accurate.  Follow-up appointment: Keep your follow-up appointment after the procedure. Usually 2 weeks for most procedures. (6 weeks in the case of radiofrequency.) Bring you pain diary.   Expect:  From numbing medicine (AKA: Local Anesthetics): Numbness or decrease in pain.  Onset: Full effect within 15  minutes of injected.  Duration: It will depend on the type of local anesthetic used. On the average, 1 to 8 hours.   From steroids: Decrease in swelling or inflammation. Once inflammation is improved, relief of the pain will follow.  Onset of benefits: Depends on the amount of swelling present. The more swelling, the longer it will take for the benefits to be seen. In some cases, up to 10 days.  Duration: Steroids will stay in the system x 2 weeks. Duration of benefits will depend on multiple posibilities including persistent irritating factors.  From procedure: Some discomfort is to be expected once the numbing medicine wears off. This should be minimal if ice and heat are applied as instructed.  Call if:  You experience numbness and weakness that gets worse with time, as opposed to wearing off.  New onset bowel or bladder incontinence. (This applies to Spinal procedures only)  Emergency Numbers:  Durning business hours (Monday - Thursday, 8:00 AM - 4:00 PM) (Friday, 9:00 AM - 12:00 Noon): [phone]  After hours: [phone] ____________________________________________________________________________________________  ____________________________________________________________________________________________  Post-Procedure Discharge Instructions  Instructions:  Apply ice: Fill a plastic sandwich bag with crushed ice. Cover it with a small towel and apply to injection site. Apply for 15 minutes then remove x 15 minutes. Repeat sequence on day of procedure, until you go to bed. The purpose is to minimize swelling and discomfort after procedure.  Apply heat: Apply heat to procedure site starting the day following the procedure. The purpose is to treat any soreness and discomfort from the procedure.  Food intake: Start with clear liquids (like water) and advance to regular food, as  tolerated.   Physical activities: Keep activities to a minimum for the first 8 hours after  the procedure.   Driving: If you have received any sedation, you are not allowed to drive for 24 hours after your procedure.  Blood thinner: Restart your blood thinner 6 hours after your procedure. (Only for those taking blood thinners)  Insulin: As soon as you can eat, you may resume your normal dosing schedule. (Only for those taking insulin)  Infection prevention: Keep procedure site clean and dry.  Post-procedure Pain Diary: Extremely important that this be done correctly and accurately. Recorded information will be used to determine the next step in treatment.  Pain evaluated is that of treated area only. Do not include pain from an untreated area.  Complete every hour, on the hour, for the initial 8 hours. Set an alarm to help you do this part accurately.  Do not go to sleep and have it completed later. It will not be accurate.  Follow-up appointment: Keep your follow-up appointment after the procedure. Usually 2 weeks for most procedures. (6 weeks in the case of radiofrequency.) Bring you pain diary.   Expect:  From numbing medicine (AKA: Local Anesthetics): Numbness or decrease in pain.  Onset: Full effect within 15 minutes of injected.  Duration: It will depend on the type of local anesthetic used. On the average, 1 to 8 hours.   From steroids: Decrease in swelling or inflammation. Once inflammation is improved, relief of the pain will follow.  Onset of benefits: Depends on the amount of swelling present. The more swelling, the longer it will take for the benefits to be seen. In some cases, up to 10 days.  Duration: Steroids will stay in the system x 2 weeks. Duration of benefits will depend on multiple posibilities including persistent irritating factors.  From procedure: Some discomfort is to be expected once the numbing medicine wears off. This should be minimal if ice and heat are applied as instructed.  Call if:  You experience numbness and weakness that gets worse  with time, as opposed to wearing off.  New onset bowel or bladder incontinence. (This applies to Spinal procedures only)  Emergency Numbers:  Durning business hours (Monday - Thursday, 8:00 AM - 4:00 PM) (Friday, 9:00 AM - 12:00 Noon): [phone]  After hours: [phone] ____________________________________________________________________________________________  Epidural Steroid Injection An epidural steroid injection is a shot of steroid medicine and numbing medicine that is given into the space between the spinal cord and the bones in your back (epidural space). The shot helps relieve pain caused by an irritated or swollen nerve root. The amount of pain relief you get from the injection depends on what is causing the nerve to be swollen and irritated, and how long your pain lasts. You are more likely to benefit from this injection if your pain is strong and comes on suddenly rather than if you have had pain for a long time. Tell a health care provider about:  Any allergies you have.  All medicines you are taking, including vitamins, herbs, eye drops, creams, and over-the-counter medicines.  Any problems you or family members have had with anesthetic medicines.  Any blood disorders you have.  Any surgeries you have had.  Any medical conditions you have.  Whether you are pregnant or may be pregnant. What are the risks? Generally, this is a safe procedure. However, problems may occur, including:  Headache.  Bleeding.  Infection.  Allergic reaction to medicines.  Damage to your  nerves.  What happens before the procedure? Staying hydrated Follow instructions from your health care provider about hydration, which may include:  Up to 2 hours before the procedure - you may continue to drink clear liquids, such as water, clear fruit juice, black coffee, and plain tea.  Eating and drinking restrictions Follow instructions from your health care provider about eating  and drinking, which may include:  8 hours before the procedure - stop eating heavy meals or foods such as meat, fried foods, or fatty foods.  6 hours before the procedure - stop eating light meals or foods, such as toast or cereal.  6 hours before the procedure - stop drinking milk or drinks that contain milk.  2 hours before the procedure - stop drinking clear liquids.  Medicine  You may be given medicines to lower anxiety.  Ask your health care provider about: ? Changing or stopping your regular medicines. This is especially important if you are taking diabetes medicines or blood thinners. ? Taking medicines such as aspirin and ibuprofen. These medicines can thin your blood. Do not take these medicines before your procedure if your health care provider instructs you not to. General instructions  Plan to have someone take you home from the hospital or clinic. What happens during the procedure?  You may receive a medicine to help you relax (sedative).  You will be asked to lie on your abdomen.  The injection site will be cleaned.  A numbing medicine (local anesthetic) will be used to numb the injection site.  A needle will be inserted through your skin into the epidural space. You may feel some discomfort when this happens. An X-ray machine will be used to make sure the needle is put as close as possible to the affected nerve.  A steroid medicine and a local anesthetic will be injected into the epidural space.  The needle will be removed.  A bandage (dressing) will be put over the injection site. What happens after the procedure?  Your blood pressure, heart rate, breathing rate, and blood oxygen level will be monitored until the medicines you were given have worn off.  Your arm or leg may feel weak or numb for a few hours.  The injection site may feel sore.  Do not drive for 24 hours if you received a sedative. This information is not intended to replace advice given to  you by your health care provider. Make sure you discuss any questions you have with your health care provider. Document Released: 10/20/2007 Document Revised: 12/25/2015 Document Reviewed: 10/29/2015 Elsevier Interactive Patient Education  [employer].

## 2018-02-09 NOTE — Anesthesia Preprocedure Evaluation (Addendum)
Anesthesia Evaluation  Patient identified by MRN, date of birth, ID band Patient awake    Reviewed: Allergy & Precautions, H&P , NPO status , Patient's Chart, lab work & pertinent test results  Airway Mallampati: I  TM Distance: >3 FB Neck ROM: full    Dental no notable dental hx.    Pulmonary former smoker,    Pulmonary exam normal breath sounds clear to auscultation       Cardiovascular hypertension, On Medications + CAD  Normal cardiovascular exam Rhythm:regular Rate:Normal  Stent placed 2010- no issues since.     Neuro/Psych    GI/Hepatic Neg liver ROS, Medicated,  Endo/Other  diabetes, Well Controlled, Type 2  Renal/GU   negative genitourinary   Musculoskeletal   Abdominal   Peds  Hematology negative hematology ROS (+)   Anesthesia Other Findings   Reproductive/Obstetrics negative OB ROS                           Anesthesia Physical Anesthesia Plan  ASA: II  Anesthesia Plan: Bier Block and General and Bier Block-Lidocaine Only   Post-op Pain Management:    Induction:   PONV Risk Score and Plan:   Airway Management Planned:   Additional Equipment:   Intra-op Plan:   Post-operative Plan:   Informed Consent: I have reviewed the patients History and Physical, chart, labs and discussed the procedure including the risks, benefits and alternatives for the proposed anesthesia with the patient or authorized representative who has indicated his/her understanding and acceptance.     Plan Discussed with:   Anesthesia Plan Comments:       Anesthesia Quick Evaluation

## 2018-02-10 ENCOUNTER — Telehealth: Payer: Self-pay | Admitting: *Deleted

## 2018-02-10 NOTE — Telephone Encounter (Signed)
Attempted to call for post procedure follow-up. Message left. 

## 2018-02-14 ENCOUNTER — Telehealth: Payer: Self-pay
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
## 2018-02-14 NOTE — Telephone Encounter (Signed)
Called pjatient to make sure he was OK and to let him know that we appreciated him calling us. Told patient that he should never feel embarrassed to talk to . We are here for him and would be glad to talk.

## 2018-02-16 ENCOUNTER — Encounter
Admission: RE | Disposition: A | Discharge status: Home / Self Care | Payer: Self-pay | Source: Ambulatory Visit | Attending: Surgery

## 2018-02-16 ENCOUNTER — Ambulatory Visit
Admission: RE | Admit: 2018-02-16 | Discharge: 2018-02-16 | Disposition: A | Discharge status: Home / Self Care | Payer: Medicare Other | Source: Ambulatory Visit | Attending: Surgery | Admitting: Surgery

## 2018-02-16 ENCOUNTER — Ambulatory Visit: Payer: Medicare Other | Admitting: Anesthesiology

## 2018-02-16 DIAGNOSIS — Z7982 Long term (current) use of aspirin: Secondary | ICD-10-CM | POA: Diagnosis not present

## 2018-02-16 DIAGNOSIS — Z87891 Personal history of nicotine dependence: Secondary | ICD-10-CM | POA: Diagnosis not present

## 2018-02-16 DIAGNOSIS — I10 Essential (primary) hypertension: Secondary | ICD-10-CM | POA: Insufficient documentation

## 2018-02-16 DIAGNOSIS — E119 Type 2 diabetes mellitus without complications: Secondary | ICD-10-CM | POA: Diagnosis not present

## 2018-02-16 DIAGNOSIS — Z7984 Long term (current) use of oral hypoglycemic drugs: Secondary | ICD-10-CM | POA: Insufficient documentation

## 2018-02-16 DIAGNOSIS — Z8249 Family history of ischemic heart disease and other diseases of the circulatory system: Secondary | ICD-10-CM | POA: Insufficient documentation

## 2018-02-16 DIAGNOSIS — G5601 Carpal tunnel syndrome, right upper limb: Secondary | ICD-10-CM | POA: Diagnosis present

## 2018-02-16 DIAGNOSIS — I2510 Atherosclerotic heart disease of native coronary artery without angina pectoris: Secondary | ICD-10-CM | POA: Diagnosis not present

## 2018-02-16 HISTORY — DX: Unspecified osteoarthritis, unspecified site: M19.90

## 2018-02-16 HISTORY — PX: CARPAL TUNNEL RELEASE: SHX101

## 2018-02-16 LAB — GLUCOSE, CAPILLARY
GLUCOSE-CAPILLARY: 163 mg/dL — AB (ref 70–99)
Glucose-Capillary: 149 mg/dL — ABNORMAL HIGH (ref 70–99)

## 2018-02-16 SURGERY — RELEASE, CARPAL TUNNEL, ENDOSCOPIC
Anesthesia: Regional | Laterality: Right | Wound class: Clean

## 2018-02-16 MED ORDER — MIDAZOLAM HCL 5 MG/5ML IJ SOLN
INTRAMUSCULAR | Status: DC | PRN
  Administered 2018-02-16 (×2): 1 mg via INTRAVENOUS

## 2018-02-16 MED ORDER — BUPIVACAINE HCL (PF) 0.5 % IJ SOLN
INTRAMUSCULAR | Status: DC | PRN
  Administered 2018-02-16: 10 mL

## 2018-02-16 MED ORDER — PROPOFOL 500 MG/50ML IV EMUL
INTRAVENOUS | Status: DC | PRN
  Administered 2018-02-16: 25 ug/kg/min via INTRAVENOUS

## 2018-02-16 MED ORDER — LACTATED RINGERS IV SOLN
INTRAVENOUS | Status: DC
  Administered 2018-02-16: 12:00:00 via INTRAVENOUS

## 2018-02-16 MED ORDER — FENTANYL CITRATE (PF) 100 MCG/2ML IJ SOLN
25.0000 ug | INTRAMUSCULAR | Status: DC | PRN
  Administered 2018-02-16 (×2): 50 ug via INTRAVENOUS

## 2018-02-16 MED ORDER — OXYCODONE HCL 5 MG PO TABS: 5.0000 mg | ORAL_TABLET | Freq: Once | ORAL | Status: DC | PRN

## 2018-02-16 MED ORDER — HYDROCODONE-ACETAMINOPHEN 5-325 MG PO TABS: 1.0000 | ORAL_TABLET | Freq: Four times a day (QID) | ORAL | 0 refills | Status: DC | PRN

## 2018-02-16 MED ORDER — OXYCODONE HCL 5 MG/5ML PO SOLN: 5.0000 mg | Freq: Once | ORAL | Status: DC | PRN

## 2018-02-16 MED ORDER — LIDOCAINE HCL (PF) 0.5 % IJ SOLN
INTRAMUSCULAR | Status: DC | PRN
Start: 2018-02-16 — End: 2018-02-16
  Administered 2018-02-16: 48 mL via INTRAVENOUS

## 2018-02-16 MED ORDER — DEXTROSE 5 % IV SOLN
2000.0000 mg | Freq: Once | INTRAVENOUS | Status: AC
  Administered 2018-02-16: 2000 mg via INTRAVENOUS

## 2018-02-16 SURGICAL SUPPLY — 28 items
BANDAGE ELASTIC 2 LF NS (GAUZE/BANDAGES/DRESSINGS) ×2 IMPLANT
BNDG CMPR MED 5X2 ELC HKLP NS (GAUZE/BANDAGES/DRESSINGS) ×1
BNDG COHESIVE 4X5 TAN STRL (GAUZE/BANDAGES/DRESSINGS) ×2 IMPLANT
BNDG ESMARK 4X12 TAN STRL LF (GAUZE/BANDAGES/DRESSINGS) ×2 IMPLANT
CHLORAPREP W/TINT 26ML (MISCELLANEOUS) ×2 IMPLANT
CORD BIP STRL DISP 12FT (MISCELLANEOUS) ×2 IMPLANT
COVER LIGHT HANDLE UNIVERSAL (MISCELLANEOUS) ×4 IMPLANT
CUFF TOURNIQUET DUAL PORT 18X3 (MISCELLANEOUS) ×2 IMPLANT
DRAPE SURG 17X11 SM STRL (DRAPES) ×2 IMPLANT
GAUZE PETRO XEROFOAM 1X8 (MISCELLANEOUS) ×2 IMPLANT
GAUZE SPONGE 4X4 12PLY STRL (GAUZE/BANDAGES/DRESSINGS) ×2 IMPLANT
GLOVE BIO SURGEON STRL SZ8 (GLOVE) ×2 IMPLANT
GLOVE INDICATOR 8.0 STRL GRN (GLOVE) ×2 IMPLANT
GOWN STRL REUS W/ TWL LRG LVL3 (GOWN DISPOSABLE) ×1 IMPLANT
GOWN STRL REUS W/ TWL XL LVL3 (GOWN DISPOSABLE) ×1 IMPLANT
GOWN STRL REUS W/TWL LRG LVL3 (GOWN DISPOSABLE) ×2
GOWN STRL REUS W/TWL XL LVL3 (GOWN DISPOSABLE) ×2
KIT CARPAL TUNNEL (MISCELLANEOUS) ×2
KIT ESCP INSRT D SLOT CANN KN (MISCELLANEOUS) ×1 IMPLANT
KIT TURNOVER KIT A (KITS) ×2 IMPLANT
NS IRRIG 500ML POUR BTL (IV SOLUTION) ×2 IMPLANT
PACK EXTREMITY ARMC (MISCELLANEOUS) ×2 IMPLANT
SPLINT WRIST LG LT TX990309 (SOFTGOODS) ×1 IMPLANT
STOCKINETTE IMPERVIOUS 9X36 MD (GAUZE/BANDAGES/DRESSINGS) ×2 IMPLANT
STRAP BODY AND KNEE 60X3 (MISCELLANEOUS) ×2 IMPLANT
SUT PROLENE 4 0 PS 2 18 (SUTURE) ×2 IMPLANT
SUT VIC AB 3-0 SH 27 (SUTURE)
SUT VIC AB 3-0 SH 27X BRD (SUTURE) IMPLANT

## 2018-02-16 NOTE — Discharge Instructions (Addendum)
General Anesthesia, Adult, Care After These instructions provide you with information about caring for yourself after your procedure. Your health care provider may also give you more specific instructions. Your treatment has been planned according to current medical practices, but problems sometimes occur. Call your health care provider if you have any problems or questions after your procedure. What can I expect after the procedure? After the procedure, it is common to have:  Vomiting.  A sore throat.  Mental slowness.  It is common to feel:  Nauseous.  Cold or shivery.  Sleepy.  Tired.  Sore or achy, even in parts of your body where you did not have surgery.  Follow these instructions at home: For at least 24 hours after the procedure:  Do not: ? Participate in activities where you could fall or become injured. ? Drive. ? Use heavy machinery. ? Drink alcohol. ? Take sleeping pills or medicines that cause drowsiness. ? Make important decisions or sign legal documents. ? Take care of children on your own.  Rest. Eating and drinking  If you vomit, drink water, juice, or soup when you can drink without vomiting.  Drink enough fluid to keep your urine clear or pale yellow.  Make sure you have little or no nausea before eating solid foods.  Follow the diet recommended by your health care provider. General instructions  Have a responsible adult stay with you until you are awake and alert.  Return to your normal activities as told by your health care provider. Ask your health care provider what activities are safe for you.  Take over-the-counter and prescription medicines only as told by your health care provider.  If you smoke, do not smoke without supervision.  Keep all follow-up visits as told by your health care provider. This is important. Contact a health care provider if:  You continue to have nausea or vomiting at home, and medicines are not helpful.  You  cannot drink fluids or start eating again.  You cannot urinate after 8-12 hours.  You develop a skin rash.  You have fever.  You have increasing redness at the site of your procedure. Get help right away if:  You have difficulty breathing.  You have chest pain.  You have unexpected bleeding.  You feel that you are having a life-threatening or urgent problem. This information is not intended to replace advice given to you by your health care provider. Make sure you discuss any questions you have with your health care provider. Document Released: 10/19/2000 Document Revised: 12/16/2015 Document Reviewed: 06/27/2015 Elsevier Interactive Patient Education  2018 [place] discharge instructions: Keep dressing dry and intact. Keep hand elevated above heart level. May shower after dressing removed on postop day 4 (Sunday). Cover sutures with Band-Aids after drying off, then reapply Velcro splint. Apply ice to affected area frequently. Take ibuprofen 600-800 mg TID with meals for 7-10 days, then as necessary. Take ES Tylenol or pain medication as prescribed when needed.  Return for follow-up in 10-14 days or as scheduled.

## 2018-02-16 NOTE — Op Note (Signed)
02/16/2018  1:16 PM  Patient:   Peter Lamb  Pre-Op Diagnosis:   Right carpal tunnel syndrome.  Post-Op Diagnosis:   Same.  Procedure:   Endoscopic right carpal tunnel release.  Surgeon:   Pascal Lux, MD  Anesthesia:   Bier block  Findings:   As above.  Complications:   None  EBL:   0 cc  Fluids:   700 cc crystalloid  TT:   27 minutes at 250 mmHg  Drains:   None  Closure:   4-0 Prolene interrupted sutures  Brief Clinical Note:   The patient is a 62 year old male with a history of progressively worsening pain and paresthesias to his right hand. His history and examination are consistent with carpal tunnel syndrome confirmed by EMG. The patient presents at this time for an endoscopic right carpal tunnel release.   Procedure:   The patient was brought into the operating room and lain in the supine position. After adequate IV sedation was achieved, a timeout was performed to verify the appropriate surgical site before a Bier block was placed by the anesthesiologist and the tourniquet inflated to 250 mmHg. The right hand and upper extremity were prepped with ChloraPrep solution before being draped sterilely. Preoperative antibiotics were administered. An approximately 1.5-2 cm incision was made over the volar wrist flexion crease, centered over the palmaris longus tendon. The incision was carried down through the subcutaneous tissues with care taken to identify and protect any neurovascular structures. The distal forearm fascia was penetrated just proximal to the transverse carpal ligament. The soft tissues were released off the superficial and deep surfaces of the distal forearm fascia and this was released proximally for 3-4 cm under direct visualization.  Attention was directed distally. The Soil scientist was passed beneath the transverse carpal ligament along the ulnar aspect of the carpal tunnel and used to release any adhesions as well as to remove any adherent synovial  tissue before first the smaller then the larger of the two dilators were passed beneath the transverse carpal ligament along the ulnar margin of the carpal tunnel. The slotted cannula was introduced and the endoscope was placed into the slotted cannula and the undersurface of the transverse carpal ligament visualized. The distal margin of the transverse carpal ligament was marked by placing a 25-gauge needle percutaneously at [place] cardinal point so that it entered the distal portion of the slotted cannula. Under endoscopic visualization, the transverse carpal ligament was released from proximal to distal using the end-cutting blade. A second pass was performed to ensure complete release of the ligament. The adequacy of release was verified both endoscopically and by palpation using the freer elevator.  The wound was irrigated thoroughly with sterile saline solution before being closed using 4-0 Prolene interrupted sutures. A total of 10 cc of 0.5% plain Sensorcaine was injected in and around the incision before a sterile bulky dressing was applied to the wound. The patient was placed into a volar wrist splint before being awakened and returned to the recovery room in satisfactory condition after tolerating the procedure well.

## 2018-02-16 NOTE — H&P (Signed)
Paper H&P to be scanned into permanent record. H&P reviewed and patient re-examined. No changes. 

## 2018-02-16 NOTE — Transfer of Care (Signed)
Immediate Anesthesia Transfer of Care Note  Patient: Peter Lamb  Procedure(s) Performed: CARPAL TUNNEL RELEASE ENDOSCOPIC (Right )  Patient Location: PACU  Anesthesia Type: Bier Block, General  Level of Consciousness: awake, alert  and patient cooperative  Airway and Oxygen Therapy: Patient Spontanous Breathing and Patient connected to supplemental oxygen  Post-op Assessment: Post-op Vital signs reviewed, Patient's Cardiovascular Status Stable, Respiratory Function Stable, Patent Airway and No signs of Nausea or vomiting  Post-op Vital Signs: Reviewed and stable  Complications: No apparent anesthesia complications

## 2018-02-16 NOTE — Anesthesia Postprocedure Evaluation (Signed)
Anesthesia Post Note  Patient: Peter Lamb  Procedure(s) Performed: CARPAL TUNNEL RELEASE ENDOSCOPIC (Right )  Patient location during evaluation: PACU Anesthesia Type: Bier Block Level of consciousness: awake and alert Pain management: pain level controlled Vital Signs Assessment: post-procedure vital signs reviewed and stable Respiratory status: spontaneous breathing Cardiovascular status: blood pressure returned to baseline Postop Assessment: no headache Anesthetic complications: no    Jaci Standard, III,  Starlin Steib D

## 2018-02-17 ENCOUNTER — Encounter: Payer: Self-pay | Admitting: Surgery

## 2018-02-17 MED ORDER — OXYCODONE ER 36 MG PO C12A: 72.0000 mg | EXTENDED_RELEASE_CAPSULE | Freq: Two times a day (BID) | ORAL | 0 refills | Status: DC

## 2018-02-17 NOTE — Addendum Note (Signed)
Addended by: Molli Barrows on: 02/17/2018 01:10 PM   Modules accepted: Orders

## 2018-03-18 ENCOUNTER — Ambulatory Visit (HOSPITAL_BASED_OUTPATIENT_CLINIC_OR_DEPARTMENT_OTHER): Payer: Medicare Other | Admitting: Anesthesiology

## 2018-03-18 ENCOUNTER — Other Ambulatory Visit: Payer: Self-pay

## 2018-03-18 ENCOUNTER — Encounter: Payer: Self-pay | Admitting: Anesthesiology

## 2018-03-18 ENCOUNTER — Ambulatory Visit
Admission: RE | Admit: 2018-03-18 | Discharge: 2018-03-18 | Disposition: A | Discharge status: Home / Self Care | Payer: Medicare Other | Source: Ambulatory Visit | Attending: Anesthesiology | Admitting: Anesthesiology

## 2018-03-18 ENCOUNTER — Other Ambulatory Visit: Payer: Self-pay | Admitting: Anesthesiology

## 2018-03-18 VITALS — BP 135/90 | HR 63 | Temp 98.0°F | Resp 16 | Ht 69.0 in | Wt 214.0 lb

## 2018-03-18 DIAGNOSIS — R52 Pain, unspecified: Secondary | ICD-10-CM

## 2018-03-18 DIAGNOSIS — M79662 Pain in left lower leg: Secondary | ICD-10-CM | POA: Insufficient documentation

## 2018-03-18 DIAGNOSIS — M5136 Other intervertebral disc degeneration, lumbar region: Secondary | ICD-10-CM | POA: Insufficient documentation

## 2018-03-18 DIAGNOSIS — G894 Chronic pain syndrome: Secondary | ICD-10-CM

## 2018-03-18 DIAGNOSIS — G8929 Other chronic pain: Secondary | ICD-10-CM | POA: Diagnosis not present

## 2018-03-18 DIAGNOSIS — M47816 Spondylosis without myelopathy or radiculopathy, lumbar region: Secondary | ICD-10-CM

## 2018-03-18 DIAGNOSIS — Z79891 Long term (current) use of opiate analgesic: Secondary | ICD-10-CM

## 2018-03-18 DIAGNOSIS — M5442 Lumbago with sciatica, left side: Secondary | ICD-10-CM | POA: Insufficient documentation

## 2018-03-18 DIAGNOSIS — M545 Low back pain: Secondary | ICD-10-CM | POA: Diagnosis present

## 2018-03-18 DIAGNOSIS — M4716 Other spondylosis with myelopathy, lumbar region: Secondary | ICD-10-CM | POA: Diagnosis not present

## 2018-03-18 MED ORDER — IOPAMIDOL (ISOVUE-M 200) INJECTION 41%
20.0000 mL | Freq: Once | INTRAMUSCULAR | Status: DC | PRN
  Administered 2018-03-18: 10 mL
  Filled 2018-03-18: qty 20

## 2018-03-18 MED ORDER — OXYCODONE ER 36 MG PO C12A: 72.0000 mg | EXTENDED_RELEASE_CAPSULE | Freq: Two times a day (BID) | ORAL | 0 refills | Status: AC

## 2018-03-18 MED ORDER — IOPAMIDOL (ISOVUE-M 200) INJECTION 41%
INTRAMUSCULAR | Status: AC
  Filled 2018-03-18: qty 10

## 2018-03-18 MED ORDER — ROPIVACAINE HCL 2 MG/ML IJ SOLN
INTRAMUSCULAR | Status: AC
  Filled 2018-03-18: qty 10

## 2018-03-18 MED ORDER — SODIUM CHLORIDE 0.9 % IJ SOLN
INTRAMUSCULAR | Status: AC
  Filled 2018-03-18: qty 10

## 2018-03-18 MED ORDER — OXYCODONE ER 36 MG PO C12A: 72.0000 mg | EXTENDED_RELEASE_CAPSULE | Freq: Two times a day (BID) | ORAL | 0 refills | Status: DC

## 2018-03-18 MED ORDER — LIDOCAINE HCL (PF) 1 % IJ SOLN
5.0000 mL | Freq: Once | INTRAMUSCULAR | Status: AC
  Administered 2018-03-18: 5 mL via SUBCUTANEOUS

## 2018-03-18 MED ORDER — SODIUM CHLORIDE 0.9% FLUSH
10.0000 mL | Freq: Once | INTRAVENOUS | Status: AC
  Administered 2018-03-18: 10 mL

## 2018-03-18 MED ORDER — LIDOCAINE HCL (PF) 1 % IJ SOLN
INTRAMUSCULAR | Status: AC
  Filled 2018-03-18: qty 5

## 2018-03-18 MED ORDER — ROPIVACAINE HCL 2 MG/ML IJ SOLN
10.0000 mL | Freq: Once | INTRAMUSCULAR | Status: AC
  Administered 2018-03-18: 10 mL via EPIDURAL

## 2018-03-18 MED ORDER — TRIAMCINOLONE ACETONIDE 40 MG/ML IJ SUSP
40.0000 mg | Freq: Once | INTRAMUSCULAR | Status: AC
  Administered 2018-03-18: 40 mg

## 2018-03-18 MED ORDER — TRIAMCINOLONE ACETONIDE 40 MG/ML IJ SUSP
INTRAMUSCULAR | Status: AC
  Filled 2018-03-18: qty 1

## 2018-03-18 NOTE — Progress Notes (Signed)
Subjective:  Patient ID: Peter Lamb, male    DOB: 12-16-1955  Age: 62 y.o. MRN: [id number]  CC: Back Pain (lower)   Procedure: L5-S1 epidural steroid under fluoroscopic guidance without sedation  HPI Peter Lamb presents for reevaluation.  He was last seen proximal a month ago and had a caudal epidural at that time.  He documents approximately 50 to 70% improvement lasting about 3 weeks with some gradual recurrence of some left lower extremity calf pain.  This is much less intense than previously and his low back pain is approximately 25 to 50% better as well.  The quality of the pain is similar nature with occasional spasming in the low back with radiation into the hips and buttocks and down the leg.  No change in bowel or bladder function or weakness is noted.  He is taking his medications as prescribed for his chronic low back pain with this being an acute exacerbation.  Based on his narcotic assessment sheet he continues to derive good functional lifestyle improvement with no side effects from his medicines.  Outpatient Medications Prior to Visit  Medication Sig Dispense Refill  . APPLE CIDER VINEGAR PO Take 480 mg by mouth daily.    Marland Kitchen aspirin 81 MG tablet Take 162 mg by mouth daily.    Marland Kitchen atenolol (TENORMIN) 100 MG tablet Take 50 mg by mouth 2 (two) times daily.    Marland Kitchen diltiazem (DILTIAZEM CD) 240 MG 24 hr capsule Take 240 mg by mouth daily.    Marland Kitchen glipiZIDE (GLUCOTROL) 10 MG tablet Take 10 mg 2 (two) times daily by mouth.  3  . lisinopril (PRINIVIL,ZESTRIL) 20 MG tablet Take 20 mg by mouth daily.    . Melatonin 10 MG TABS Take by mouth at bedtime as needed.    . metFORMIN (GLUCOPHAGE) 500 MG tablet Take 500 mg by mouth daily with breakfast. Reported on 02/10/2016    . Multiple Vitamins-Minerals (VITAMINS TO GO MEN PO) Take 1 tablet by mouth daily.     . nitroGLYCERIN (NITROSTAT) 0.4 MG SL tablet Place 1 tablet under tongue as needed for chest pain (may repeat every 5 minutes but seek  medical help if pain persists after 3 tablets) as needed    . Omega-3 Fatty Acids (FISH OIL) 1000 MG CAPS Take by mouth daily.    . rosuvastatin (CRESTOR) 10 MG tablet Take 10 mg by mouth daily.    . sertraline (ZOLOFT) 100 MG tablet Take 200 mg by mouth daily.  0  . sertraline (ZOLOFT) 50 MG tablet Take 100 mg by mouth at bedtime.     . sildenafil (REVATIO) 20 MG tablet Take 20 mg by mouth as needed (1-5  PO prn).    Marland Kitchen tiZANidine (ZANAFLEX) 4 MG tablet Take 1 tablet (4 mg total) by mouth 2 (two) times daily. 60 tablet 3  . TraZODone HCl 150 MG TB24 Take by mouth at bedtime.    Marland Kitchen VITAMIN E PO Take 90 Units by mouth daily.    Marland Kitchen oxyCODONE ER (XTAMPZA ER) 36 MG C12A Take 2 capsules (72 mg total) by mouth 2 (two) times daily. 120 capsule 0  . HYDROcodone-acetaminophen (NORCO/VICODIN) 5-325 MG tablet Take 1-2 tablets by mouth every 6 (six) hours as needed for moderate pain. (Patient not taking: Reported on 03/18/2018) 20 tablet 0   No facility-administered medications prior to visit.     Review of Systems CNS: No confusion or sedation Cardiac: No angina or palpitations GI: No abdominal pain  or constipation Constitutional: No nausea vomiting fevers or chills  Objective:  BP 135/90   Pulse 63   Temp 98 F (36.7 C)   Resp 16   Ht 5\' 9"  (1.753 m)   Wt 214 lb (97.1 kg)   SpO2 96%   BMI 31.60 kg/m    BP Readings from Last 3 Encounters:  03/18/18 135/90  02/16/18 125/75  02/09/18 (!) 147/91     Wt Readings from Last 3 Encounters:  03/18/18 214 lb (97.1 kg)  02/16/18 220 lb (99.8 kg)  02/09/18 222 lb (100.7 kg)     Physical Exam Pt is alert and oriented PERRL EOMI HEART IS RRR no murmur or rub LCTA no wheezing or rales MUSCULOSKELETAL reveals some paraspinous muscle tenderness but no overt trigger points.  Previously he had a minor cutaneous skin infection in the low lumbar region and this is healed well.  No evidence of infection here.  His muscle strength is at baseline with  good muscle tone and bulk to the lower extremities and is ambulating with an antalgic gait.  Labs  Lab Results  Component Value Date   HGBA1C 9.0 03/04/2010   Lab Results  Component Value Date   CREATININE 0.92 08/21/2016    -------------------------------------------------------------------------------------------------------------------- Lab Results  Component Value Date   WBC 9.2 08/21/2016   HGB 15.3 08/21/2016   HCT 43.6 08/21/2016   PLT 219 08/21/2016   GLUCOSE 132 (H) 08/21/2016   ALT 31 03/04/2010   AST 23 03/04/2010   NA 135 08/21/2016   K 3.7 08/21/2016   CL 100 (L) 08/21/2016   CREATININE 0.92 08/21/2016   BUN 11 08/21/2016   CO2 26 08/21/2016   PSA 0.26 03/04/2010   HGBA1C 9.0 03/04/2010    --------------------------------------------------------------------------------------------------------------------- Dg C-arm 1-60 Min-no Report  Result Date: 03/18/2018 Fluoroscopy was utilized by the requesting physician.  No radiographic interpretation.     Assessment & Plan:   Peter Lamb was seen today for back pain.  Diagnoses and all orders for this visit:  Chronic left-sided low back pain with left-sided sciatica -     Lumbar Epidural Injection  Chronic pain syndrome  DDD (degenerative disc disease), lumbar -     Lumbar Epidural Injection  Long term current use of opiate analgesic  Facet arthritis of lumbar region  Lumbar spondylosis with myelopathy  Other orders -     Discontinue: oxyCODONE ER (XTAMPZA ER) 36 MG C12A; Take 2 capsules (72 mg total) by mouth 2 (two) times daily. -     oxyCODONE ER (XTAMPZA ER) 36 MG C12A; Take 2 capsules (72 mg total) by mouth 2 (two) times daily. -     triamcinolone acetonide (KENALOG-40) injection 40 mg -     ropivacaine (PF) 2 mg/mL (0.2%) (NAROPIN) injection 10 mL -     sodium chloride flush (NS) 0.9 % injection 10 mL -     lidocaine (PF) (XYLOCAINE) 1 % injection 5 mL -     iopamidol (ISOVUE-M) 41 % intrathecal  injection 20 mL        ----------------------------------------------------------------------------------------------------------------------  Problem List Items Addressed This Visit      Unprioritized   Chronic low back pain - Primary (Chronic)   Relevant Medications   oxyCODONE ER (XTAMPZA ER) 36 MG C12A   triamcinolone acetonide (KENALOG-40) injection 40 mg (Completed)   Chronic pain (Chronic)   Relevant Medications   oxyCODONE ER (XTAMPZA ER) 36 MG C12A   triamcinolone acetonide (KENALOG-40) injection 40 mg (Completed)  ropivacaine (PF) 2 mg/mL (0.2%) (NAROPIN) injection 10 mL (Completed)   lidocaine (PF) (XYLOCAINE) 1 % injection 5 mL (Completed)   DDD (degenerative disc disease), lumbar   Relevant Medications   oxyCODONE ER (XTAMPZA ER) 36 MG C12A   triamcinolone acetonide (KENALOG-40) injection 40 mg (Completed)   Long term current use of opiate analgesic (Chronic)    Other Visit Diagnoses    Facet arthritis of lumbar region       Relevant Medications   oxyCODONE ER (XTAMPZA ER) 36 MG C12A   triamcinolone acetonide (KENALOG-40) injection 40 mg (Completed)   Lumbar spondylosis with myelopathy            ----------------------------------------------------------------------------------------------------------------------  1. Chronic left-sided low back pain with left-sided sciatica We will proceed with a second epidural today for therapeutic purpose.  Of gone over the risks and benefits of the procedure with him in full detail and all questions been answered.  We will have him return to clinic in 1 month for possible injection.  We have gone over some stretching strengthening exercises to optimize his return to function. - Lumbar Epidural Injection  2. Chronic pain syndrome As above.  We have gone over the [facility] practitioner database information and it is appropriate.  We will keep him on his current medications with refill on his extamza for August 24 and  September 23  3. DDD (degenerative disc disease), lumbar As above - Lumbar Epidural Injection  4. Long term current use of opiate analgesic Above  5. Facet arthritis of lumbar region As above continue core stretching and strengthening  6. Lumbar spondylosis with myelopathy As above    ----------------------------------------------------------------------------------------------------------------------  I am having Peter Lamb maintain his aspirin, metFORMIN, sertraline, atenolol, diltiazem, lisinopril, Multiple Vitamins-Minerals (VITAMINS TO GO MEN PO), rosuvastatin, TraZODone HCl, glipiZIDE, nitroGLYCERIN, sertraline, tiZANidine, sildenafil, Fish Oil, APPLE CIDER VINEGAR PO, VITAMIN E PO, Melatonin, HYDROcodone-acetaminophen, and oxyCODONE ER. We administered triamcinolone acetonide, ropivacaine (PF) 2 mg/mL (0.2%), sodium chloride flush, lidocaine (PF), and iopamidol.   Meds ordered this encounter  Medications  . DISCONTD: oxyCODONE ER (XTAMPZA ER) 36 MG C12A    Sig: Take 2 capsules (72 mg total) by mouth 2 (two) times daily.    Dispense:  120 capsule    Refill:  0    Do not fill until [id number]  . oxyCODONE ER (XTAMPZA ER) 36 MG C12A    Sig: Take 2 capsules (72 mg total) by mouth 2 (two) times daily.    Dispense:  120 capsule    Refill:  0    Do not fill until [id number]  . triamcinolone acetonide (KENALOG-40) injection 40 mg  . ropivacaine (PF) 2 mg/mL (0.2%) (NAROPIN) injection 10 mL  . sodium chloride flush (NS) 0.9 % injection 10 mL  . lidocaine (PF) (XYLOCAINE) 1 % injection 5 mL  . iopamidol (ISOVUE-M) 41 % intrathecal injection 20 mL   Patient's Medications  New Prescriptions   No medications on file  Previous Medications   APPLE CIDER VINEGAR PO    Take 480 mg by mouth daily.   ASPIRIN 81 MG TABLET    Take 162 mg by mouth daily.   ATENOLOL (TENORMIN) 100 MG TABLET    Take 50 mg by mouth 2 (two) times daily.   DILTIAZEM (DILTIAZEM CD) 240 MG 24 HR CAPSULE     Take 240 mg by mouth daily.   GLIPIZIDE (GLUCOTROL) 10 MG TABLET    Take 10 mg 2 (two) times daily by mouth.  HYDROCODONE-ACETAMINOPHEN (NORCO/VICODIN) 5-325 MG TABLET    Take 1-2 tablets by mouth every 6 (six) hours as needed for moderate pain.   LISINOPRIL (PRINIVIL,ZESTRIL) 20 MG TABLET    Take 20 mg by mouth daily.   MELATONIN 10 MG TABS    Take by mouth at bedtime as needed.   METFORMIN (GLUCOPHAGE) 500 MG TABLET    Take 500 mg by mouth daily with breakfast. Reported on 02/10/2016   MULTIPLE VITAMINS-MINERALS (VITAMINS TO GO MEN PO)    Take 1 tablet by mouth daily.    NITROGLYCERIN (NITROSTAT) 0.4 MG SL TABLET    Place 1 tablet under tongue as needed for chest pain (may repeat every 5 minutes but seek medical help if pain persists after 3 tablets) as needed   OMEGA-3 FATTY ACIDS (FISH OIL) 1000 MG CAPS    Take by mouth daily.   ROSUVASTATIN (CRESTOR) 10 MG TABLET    Take 10 mg by mouth daily.   SERTRALINE (ZOLOFT) 100 MG TABLET    Take 200 mg by mouth daily.   SERTRALINE (ZOLOFT) 50 MG TABLET    Take 100 mg by mouth at bedtime.    SILDENAFIL (REVATIO) 20 MG TABLET    Take 20 mg by mouth as needed (1-5  PO prn).   TIZANIDINE (ZANAFLEX) 4 MG TABLET    Take 1 tablet (4 mg total) by mouth 2 (two) times daily.   TRAZODONE HCL 150 MG TB24    Take by mouth at bedtime.   VITAMIN E PO    Take 90 Units by mouth daily.  Modified Medications   Modified Medication Previous Medication   OXYCODONE ER (XTAMPZA ER) 36 MG C12A oxyCODONE ER (XTAMPZA ER) 36 MG C12A      Take 2 capsules (72 mg total) by mouth 2 (two) times daily.    Take 2 capsules (72 mg total) by mouth 2 (two) times daily.  Discontinued Medications   No medications on file   ----------------------------------------------------------------------------------------------------------------------  Follow-up: Return for evaluation, procedure.   Procedure: 5 S1 LESI with fluoroscopic guidance and no moderate sedation  NOTE: The risks,  benefits, and expectations of the procedure have been discussed and explained to the patient who was understanding and in agreement with suggested treatment plan. No guarantees were made.  DESCRIPTION OF PROCEDURE: Lumbar epidural steroid injection with no IV Versed, EKG, blood pressure, pulse, and pulse oximetry monitoring. The procedure was performed with the patient in the prone position under fluoroscopic guidance.  Sterile prep x3 was initiated and I then injected subcutaneous lidocaine to the overlying 5 S1 site after its fluoroscopic identifictation.  Using strict aseptic technique, I then advanced an 18-gauge Tuohy epidural needle in the midline using interlaminar approach via loss-of-resistance to saline technique. There was negative aspiration for heme or  CSF.  I then confirmed position with both AP and Lateral fluoroscan.  2 cc of Isovue were injected and a  total of 5 mL of Preservative-Free normal saline mixed with 40 mg of Kenalog and 1cc Ropicaine 0.2 percent were injected incrementally via the  epidurally placed needle. The needle was removed. The patient tolerated the injection well and was convalesced and discharged to home in stable condition. Should the patient have any post procedure difficulty they have been instructed on how to contact us for assistance.    Molli Barrows, MD

## 2018-03-18 NOTE — Progress Notes (Signed)
Nursing Pain Medication Assessment:  Safety precautions to be maintained throughout the outpatient stay will include: orient to surroundings, keep bed in low position, maintain call bell within reach at all times, provide assistance with transfer out of bed and ambulation.  Medication Inspection Compliance: Peter Lamb did not comply with our request to bring his pills to be counted. He was reminded that bringing the medication bottles, even when empty, is a requirement.  Medication: None brought in. Pill/Patch Count: None available to be counted. Bottle Appearance: No container available. Did not bring bottle(s) to appointment. Filled Date: N/A Last Medication intake:  Today

## 2018-03-18 NOTE — Patient Instructions (Addendum)
Post-procedure Information What to expect: Most procedures involve the use of a local anesthetic (numbing medicine), and a steroid (anti-inflammatory medicine).  The local anesthetics may cause temporary numbness and weakness of the legs or arms, depending on the location of the block. This numbness/weakness may last 4-6 hours, depending on the local anesthetic used. In rare instances, it can last up to 24 hours. While numb, you must be very careful not to injure the extremity.  After any procedure, you could expect the pain to get better within 15-20 minutes. This relief is temporary and may last 4-6 hours. Once the local anesthetics wears off, you could experience discomfort, possibly more than usual, for up to 10 (ten) days. In the case of radiofrequencies, it may last up to 6 weeks. Surgeries may take up to 8 weeks for the healing process. The discomfort is due to the irritation caused by needles going through skin and muscle. To minimize the discomfort, we recommend using ice the first day, and heat from then on. The ice should be applied for 15 minutes on, and 15 minutes off. Keep repeating this cycle until bedtime. Avoid applying the ice directly to the skin, to prevent frostbite. Heat should be used daily, until the pain improves (4-10 days). Be careful not to burn yourself.  Occasionally you may experience muscle spasms or cramps. These occur as a consequence of the irritation caused by the needle sticks to the muscle and the blood that will inevitably be lost into the surrounding muscle tissue. Blood tends to be very irritating to tissues, which tend to react by going into spasm. These spasms may start the same day of your procedure, but they may also take days to develop. This late onset type of spasm or cramp is usually caused by electrolyte imbalances triggered by the steroids, at the level of the kidney. Cramps and spasms tend to respond well to muscle relaxants, multivitamins (some are  triggered by the procedure, but may have their origins in vitamin deficiencies), and "Gatorade", or any sports drinks that can replenish any electrolyte imbalances. (If you are a diabetic, ask your pharmacist to get you a sugar-free brand.) Warm showers or baths may also be helpful. Stretching exercises are highly recommended. General Instructions:  Be alert for signs of possible infection: redness, swelling, heat, red streaks, elevated temperature, and/or fever. These typically appear 4 to 6 days after the procedure. Immediately notify your doctor if you experience unusual bleeding, difficulty breathing, or loss of bowel or bladder control. If you experience increased pain, do not increase your pain medicine intake, unless instructed by your pain physician. Post-Procedure Care:  Be careful in moving about. Muscle spasms in the area of the injection may occur. Applying ice or heat to the area is often helpful. The incidence of spinal headaches after epidural injections ranges between 1.4% and 6%. If you develop a headache that does not seem to respond to conservative therapy, please let your physician know. This can be treated with an epidural blood patch.   Post-procedure numbness or redness is to be expected, however it should average 4 to 6 hours. If numbness and weakness of your extremities begins to develop 4 to 6 hours after your procedure, and is felt to be progressing and worsening, immediately contact your physician.   Diet:  If you experience nausea, do not eat until this sensation goes away. If you had a "Stellate Ganglion Block" for upper extremity "Reflex Sympathetic Dystrophy", do not eat or drink until your   hoarseness goes away. In any case, always start with liquids first and if you tolerate them well, then slowly progress to more solid foods. Activity:  For the first 4 to 6 hours after the procedure, use caution in moving about as you may experience numbness and/or weakness. Use caution in  cooking, using household electrical appliances, and climbing steps. If you need to reach your Doctor call our office: [phone] Monday-Thursday 8:00 am - 4:00 PM    Fridays: Closed     In case of an emergency: In case of emergency, call 911 or go to the nearest emergency room and have the physician there call us.  Interpretation of Procedure Every nerve block has two components: a diagnostic component, and a treatment component. Unrealistic expectations are the most common causes of "perceived failure".  In a perfect world, a single nerve block should be able to completely and permanently eliminate the pain. Sadly, the world is not perfect.  Most pain management nerve blocks are performed using local anesthetics and steroids. Steroids are responsible for any long-term benefit that you may experience. Their purpose is to decrease any chronic swelling that may exist in the area. Steroids begin to work immediately after being injected. However, most patients will not experience any benefits until 5 to 10 days after the injection, when the swelling has come down to the point where they can tell a difference. Steroids will only help if there is swelling to be treated. As such, they can assist with the diagnosis. If effective, they suggest an inflammatory component to the pain, and if ineffective, they rule out inflammation as the main cause or component of the problem. If the problem is one of mechanical compression, you will get no benefit from those steroids.   In the case of local anesthetics, they have a crucial role in the diagnosis of your condition. Most will begin to work within15 to 20 minutes after injection. The duration will depend on the type used (short- vs. Long-acting). It is of outmost importance that patients keep tract of their pain, after the procedure. To assist with this matter, a "Post-procedure Pain Diary" is provided. Make sure to complete it and to bring it back to your  follow-up appointment.  As long as the patient keeps accurate, detailed records of their symptoms after every procedure, and returns to have those interpreted, every procedure will provide us with invaluable information. Even a block that does not provide the patient with any relief, will always provide us with information about the mechanism and the origin of the pain. The only time a nerve block can be considered a waste of time is when patients do not keep track of the results, or do not keep their post-procedure appointment.  Reporting the results back to your physician The Pain Score  Pain is a subjective complaint. It cannot be seen, touched, or measured. We depend entirely on the patient's report of the pain in order to assess your condition and treatment. To evaluate the pain, we use a pain scale, where "0" means "No Pain", and a "10" is "the worst possible pain that you can even imagine" (i.e. something like been eaten alive by a shark or being torn apart by a lion).   You will frequently be asked to rate your pain. Please be as accurate, remember that medical decisions will be based on your responses. Please do not rate your pain above a 10. Doing so is actually interpreted as "symptom magnification" (exaggeration), as   well as lack of understanding with regards to the scale. To put this into perspective, when you tell us that your pain is at a 10 (ten), what you are saying is that there is nothing we can do to make this pain any worse. (Carefully think about that.)  You were given 2 prescriptions for Oxycodone today.

## 2018-03-21 ENCOUNTER — Telehealth: Payer: Self-pay | Admitting: *Deleted

## 2018-03-21 NOTE — Telephone Encounter (Signed)
Attempted to call for post procedure follow-up. Message left. 

## 2018-03-22 ENCOUNTER — Ambulatory Visit: Payer: Medicare Other | Admitting: Anesthesiology

## 2018-03-27 ENCOUNTER — Other Ambulatory Visit: Payer: Self-pay | Admitting: Anesthesiology

## 2018-03-27 DIAGNOSIS — M7918 Myalgia, other site: Secondary | ICD-10-CM

## 2018-04-12 ENCOUNTER — Ambulatory Visit: Payer: Medicare Other | Admitting: Anesthesiology

## 2018-05-11 ENCOUNTER — Ambulatory Visit (HOSPITAL_BASED_OUTPATIENT_CLINIC_OR_DEPARTMENT_OTHER): Payer: Medicare Other | Admitting: Anesthesiology

## 2018-05-11 ENCOUNTER — Encounter: Payer: Self-pay | Admitting: Anesthesiology

## 2018-05-11 ENCOUNTER — Ambulatory Visit
Admission: RE | Admit: 2018-05-11 | Discharge: 2018-05-11 | Disposition: A | Discharge status: Home / Self Care | Payer: Medicare Other | Source: Ambulatory Visit | Attending: Anesthesiology | Admitting: Anesthesiology

## 2018-05-11 ENCOUNTER — Other Ambulatory Visit: Payer: Self-pay | Admitting: Anesthesiology

## 2018-05-11 ENCOUNTER — Other Ambulatory Visit: Payer: Self-pay

## 2018-05-11 VITALS — BP 143/93 | HR 61 | Temp 98.6°F | Resp 16 | Ht 69.0 in | Wt 210.0 lb

## 2018-05-11 DIAGNOSIS — M792 Neuralgia and neuritis, unspecified: Secondary | ICD-10-CM

## 2018-05-11 DIAGNOSIS — Z79899 Other long term (current) drug therapy: Secondary | ICD-10-CM | POA: Diagnosis not present

## 2018-05-11 DIAGNOSIS — R52 Pain, unspecified: Secondary | ICD-10-CM

## 2018-05-11 DIAGNOSIS — Z79891 Long term (current) use of opiate analgesic: Secondary | ICD-10-CM

## 2018-05-11 DIAGNOSIS — G8929 Other chronic pain: Secondary | ICD-10-CM | POA: Diagnosis not present

## 2018-05-11 DIAGNOSIS — M4716 Other spondylosis with myelopathy, lumbar region: Secondary | ICD-10-CM

## 2018-05-11 DIAGNOSIS — M5442 Lumbago with sciatica, left side: Secondary | ICD-10-CM | POA: Diagnosis not present

## 2018-05-11 DIAGNOSIS — Z7982 Long term (current) use of aspirin: Secondary | ICD-10-CM | POA: Diagnosis not present

## 2018-05-11 DIAGNOSIS — G894 Chronic pain syndrome: Secondary | ICD-10-CM | POA: Insufficient documentation

## 2018-05-11 DIAGNOSIS — M7918 Myalgia, other site: Secondary | ICD-10-CM

## 2018-05-11 DIAGNOSIS — M5136 Other intervertebral disc degeneration, lumbar region: Secondary | ICD-10-CM

## 2018-05-11 DIAGNOSIS — M47816 Spondylosis without myelopathy or radiculopathy, lumbar region: Secondary | ICD-10-CM

## 2018-05-11 DIAGNOSIS — M545 Low back pain: Secondary | ICD-10-CM | POA: Diagnosis present

## 2018-05-11 MED ORDER — LIDOCAINE HCL (PF) 1 % IJ SOLN
5.0000 mL | Freq: Once | INTRAMUSCULAR | Status: AC
  Administered 2018-05-11: 5 mL via SUBCUTANEOUS

## 2018-05-11 MED ORDER — TRIAMCINOLONE ACETONIDE 40 MG/ML IJ SUSP
INTRAMUSCULAR | Status: AC
  Filled 2018-05-11: qty 1

## 2018-05-11 MED ORDER — TRIAMCINOLONE ACETONIDE 40 MG/ML IJ SUSP
40.0000 mg | Freq: Once | INTRAMUSCULAR | Status: AC
  Administered 2018-05-11: 40 mg

## 2018-05-11 MED ORDER — ROPIVACAINE HCL 2 MG/ML IJ SOLN
10.0000 mL | Freq: Once | INTRAMUSCULAR | Status: AC
  Administered 2018-05-11: 10 mL via EPIDURAL

## 2018-05-11 MED ORDER — OXYCODONE ER 36 MG PO C12A: 1.0000 | EXTENDED_RELEASE_CAPSULE | Freq: Two times a day (BID) | ORAL | 0 refills | Status: DC

## 2018-05-11 MED ORDER — IOPAMIDOL (ISOVUE-M 200) INJECTION 41%
20.0000 mL | Freq: Once | INTRAMUSCULAR | Status: DC | PRN
  Administered 2018-05-11: 10 mL
  Filled 2018-05-11: qty 20

## 2018-05-11 MED ORDER — OXYCODONE ER 36 MG PO C12A: 2.0000 | EXTENDED_RELEASE_CAPSULE | Freq: Two times a day (BID) | ORAL | 0 refills | Status: AC

## 2018-05-11 MED ORDER — SODIUM CHLORIDE 0.9 % IJ SOLN
INTRAMUSCULAR | Status: AC
  Filled 2018-05-11: qty 10

## 2018-05-11 MED ORDER — SODIUM CHLORIDE 0.9% FLUSH
10.0000 mL | Freq: Once | INTRAVENOUS | Status: AC
  Administered 2018-05-11: 10 mL

## 2018-05-11 MED ORDER — IOPAMIDOL (ISOVUE-M 200) INJECTION 41%
INTRAMUSCULAR | Status: AC
  Filled 2018-05-11: qty 10

## 2018-05-11 MED ORDER — LIDOCAINE HCL (PF) 1 % IJ SOLN
INTRAMUSCULAR | Status: AC
  Filled 2018-05-11: qty 5

## 2018-05-11 MED ORDER — ROPIVACAINE HCL 2 MG/ML IJ SOLN
INTRAMUSCULAR | Status: AC
  Filled 2018-05-11: qty 10

## 2018-05-11 MED ORDER — OXYCODONE ER 36 MG PO C12A: 2.0000 | EXTENDED_RELEASE_CAPSULE | Freq: Two times a day (BID) | ORAL | 0 refills | Status: DC

## 2018-05-11 NOTE — Progress Notes (Signed)
Nursing Pain Medication Assessment:  Safety precautions to be maintained throughout the outpatient stay will include: orient to surroundings, keep bed in low position, maintain call bell within reach at all times, provide assistance with transfer out of bed and ambulation.  Medication Inspection Compliance: Pill count conducted under aseptic conditions, in front of the patient. Neither the pills nor the bottle was removed from the patient's sight at any time. Once count was completed pills were immediately returned to the patient in their original bottle.  Medication: xtampza er Pill/Patch Count: 26 of 120 pills remain Pill/Patch Appearance: Markings consistent with prescribed medication Bottle Appearance: Standard pharmacy container. Clearly labeled. Filled Date: 87 / 23 / 2019 Last Medication intake:  Today

## 2018-05-11 NOTE — Patient Instructions (Addendum)
You were given two prescriptions for Extampza today.  ____________________________________________________________________________________________  Post-procedure Information What to expect: Most procedures involve the use of a local anesthetic (numbing medicine), and a steroid (anti-inflammatory medicine).  The local anesthetics may cause temporary numbness and weakness of the legs or arms, depending on the location of the block. This numbness/weakness may last 4-6 hours, depending on the local anesthetic used. In rare instances, it can last up to 24 hours. While numb, you must be very careful not to injure the extremity.  After any procedure, you could expect the pain to get better within 15-20 minutes. This relief is temporary and may last 4-6 hours. Once the local anesthetics wears off, you could experience discomfort, possibly more than usual, for up to 10 (ten) days. In the case of radiofrequencies, it may last up to 6 weeks. Surgeries may take up to 8 weeks for the healing process. The discomfort is due to the irritation caused by needles going through skin and muscle. To minimize the discomfort, we recommend using ice the first day, and heat from then on. The ice should be applied for 15 minutes on, and 15 minutes off. Keep repeating this cycle until bedtime. Avoid applying the ice directly to the skin, to prevent frostbite. Heat should be used daily, until the pain improves (4-10 days). Be careful not to burn yourself.  Occasionally you may experience muscle spasms or cramps. These occur as a consequence of the irritation caused by the needle sticks to the muscle and the blood that will inevitably be lost into the surrounding muscle tissue. Blood tends to be very irritating to tissues, which tend to react by going into spasm. These spasms may start the same day of your procedure, but they may also take days to develop. This late onset type of spasm or cramp is usually caused by electrolyte  imbalances triggered by the steroids, at the level of the kidney. Cramps and spasms tend to respond well to muscle relaxants, multivitamins (some are triggered by the procedure, but may have their origins in vitamin deficiencies), and "Gatorade", or any sports drinks that can replenish any electrolyte imbalances. (If you are a diabetic, ask your pharmacist to get you a sugar-free brand.) Warm showers or baths may also be helpful. Stretching exercises are highly recommended.  General Instructions:  Be alert for signs of possible infection: redness, swelling, heat, red streaks, elevated temperature, and/or fever. These typically appear 4 to 6 days after the procedure. Immediately notify your doctor if you experience unusual bleeding, difficulty breathing, or loss of bowel or bladder control. If you experience increased pain, do not increase your pain medicine intake, unless instructed by your pain physician.  Post-Procedure Care:  Be careful in moving about. Muscle spasms in the area of the injection may occur. Applying ice or heat to the area is often helpful. The incidence of spinal headaches after epidural injections ranges between 1.4% and 6%. If you develop a headache that does not seem to respond to conservative therapy, please let your physician know. This can be treated with an epidural blood patch.   Post-procedure numbness or redness is to be expected, however it should average 4 to 6 hours. If numbness and weakness of your extremities begins to develop 4 to 6 hours after your procedure, and is felt to be progressing and worsening, immediately contact your physician.  Diet:  If you experience nausea, do not eat until this sensation goes away. If you had a "Stellate Ganglion Block" for  upper extremity "Reflex Sympathetic Dystrophy", do not eat or drink until your hoarseness goes away. In any case, always start with liquids first and if you tolerate them well, then slowly progress to more solid  foods.  Activity:  For the first 4 to 6 hours after the procedure, use caution in moving about as you may experience numbness and/or weakness. Use caution in cooking, using household electrical appliances, and climbing steps. If you need to reach your Doctor call our office: [phone] (During business hours) or [phone] (After business hours).  Business Hours: Monday-Thursday 8:00 am - 4:00 PM    Fridays: Closed     In case of an emergency: In case of emergency, call 911 or go to the nearest emergency room and have the physician there call us.  Interpretation of Procedure Every nerve block has two components: a diagnostic component, and a treatment component. Unrealistic expectations are the most common causes of "perceived failure".  In a perfect world, a single nerve block should be able to completely and permanently eliminate the pain. Sadly, the world is not perfect.  Most pain management nerve blocks are performed using local anesthetics and steroids. Steroids are responsible for any long-term benefit that you may experience. Their purpose is to decrease any chronic swelling that may exist in the area. Steroids begin to work immediately after being injected. However, most patients will not experience any benefits until 5 to 10 days after the injection, when the swelling has come down to the point where they can tell a difference. Steroids will only help if there is swelling to be treated. As such, they can assist with the diagnosis. If effective, they suggest an inflammatory component to the pain, and if ineffective, they rule out inflammation as the main cause or component of the problem. If the problem is one of mechanical compression, you will get no benefit from those steroids.   In the case of local anesthetics, they have a crucial role in the diagnosis of your condition. Most will begin to work within15 to 20 minutes after injection. The duration will depend on the type used  (short- vs. Long-acting). It is of outmost importance that patients keep tract of their pain, after the procedure. To assist with this matter, a "Post-procedure Pain Diary" is provided. Make sure to complete it and to bring it back to your follow-up appointment.  As long as the patient keeps accurate, detailed records of their symptoms after every procedure, and returns to have those interpreted, every procedure will provide [place] with invaluable information. Even a block that does not provide the patient with any relief, will always provide [place] with information about the mechanism and the origin of the pain. The only time a nerve block can be considered a waste of time is when patients do not keep track of the results, or do not keep their post-procedure appointment.  Reporting the results back to your physician The Pain Score  Pain is a subjective complaint. It cannot be seen, touched, or measured. We depend entirely on the patient's report of the pain in order to assess your condition and treatment. To evaluate the pain, we use a pain scale, where "0" means "No Pain", and a "10" is "the worst possible pain that you can even imagine" (i.e. something like been eaten alive by a shark or being torn apart by a lion).   Use the Pain Scale provided. You will frequently be asked to rate your pain. Please be accurate,  remember that medical decisions will be based on your responses. Please do not rate your pain above a 10. Doing so is actually interpreted as "symptom magnification" (exaggeration). To put this into perspective, when you tell us that your pain is at a 10 (ten), what you are saying is that there is nothing we can do to make this pain any worse. (Carefully think about that.) ____________________________________________________________________________________________ Pain Management Discharge Instructions  General Discharge Instructions :  If you need to reach your doctor call: Monday-Friday 8:00 am -  4:00 pm at [phone] or toll free [phone].  After clinic hours [phone] to have operator reach doctor.  Bring all of your medication bottles to all your appointments in the pain clinic.  To cancel or reschedule your appointment with Pain Management please remember to call 24 hours in advance to avoid a fee.  Refer to the educational materials which you have been given on: General Risks, I had my Procedure. Discharge Instructions, Post Sedation.  Post Procedure Instructions:   Please notify your doctor immediately if you have any unusual bleeding, trouble breathing or pain that is not related to your normal pain.  Depending on the type of procedure that was done, some parts of your body may feel week and/or numb.  This usually clears up by tonight or the next day.  Walk with the use of an assistive device or accompanied by an adult for the 24 hours.  You may use ice on the affected area for the first 24 hours.  Put ice in a Ziploc bag and cover with a towel and place against area 15 minutes on 15 minutes off.  You may switch to heat after 24 hours.

## 2018-05-11 NOTE — Progress Notes (Signed)
Subjective:  Patient ID: Peter Lamb, male    DOB: September 22, 1955  Age: 62 y.o. MRN: [id number]  CC: Back Pain (lower)   Procedure: L5-S1 epidural steroid under fluoroscopic guidance without sedation  HPI Peter Lamb presents for reevaluation.  He was last seen in August and at that point he had a second epidural injection.  He is getting good relief rated approximately 60 to 70% generally lasting 6 weeks.  He is had some recurrence of the same quality characteristic and distribution of pain as previously reported but this is less intense.  He is sleeping better at night and able to manage with his medication assistance.  Based on his narcotic assessment sheet he continues to derive good functional lifestyle improvement with his medicines and no side effects.  Otherwise he is doing well.  He desires to proceed with a repeat epidural injection today for his radicular pain.  Outpatient Medications Prior to Visit  Medication Sig Dispense Refill  . APPLE CIDER VINEGAR PO Take 480 mg by mouth daily.    Marland Kitchen aspirin 81 MG tablet Take 162 mg by mouth daily.    Marland Kitchen atenolol (TENORMIN) 100 MG tablet Take 50 mg by mouth 2 (two) times daily.    Marland Kitchen diltiazem (DILTIAZEM CD) 240 MG 24 hr capsule Take 240 mg by mouth daily.    Marland Kitchen glipiZIDE (GLUCOTROL) 10 MG tablet Take 10 mg 2 (two) times daily by mouth.  3  . lisinopril (PRINIVIL,ZESTRIL) 20 MG tablet Take 20 mg by mouth daily.    . Melatonin 10 MG TABS Take by mouth at bedtime as needed.    . metFORMIN (GLUCOPHAGE) 500 MG tablet Take 500 mg by mouth daily with breakfast. Reported on 02/10/2016    . Multiple Vitamins-Minerals (VITAMINS TO GO MEN PO) Take 1 tablet by mouth daily.     . nitroGLYCERIN (NITROSTAT) 0.4 MG SL tablet Place 1 tablet under tongue as needed for chest pain (may repeat every 5 minutes but seek medical help if pain persists after 3 tablets) as needed    . Omega-3 Fatty Acids (FISH OIL) 1000 MG CAPS Take by mouth daily.    . rosuvastatin  (CRESTOR) 10 MG tablet Take 10 mg by mouth daily.    . sertraline (ZOLOFT) 100 MG tablet Take 200 mg by mouth daily.  0  . sertraline (ZOLOFT) 50 MG tablet Take 100 mg by mouth at bedtime.     . sildenafil (REVATIO) 20 MG tablet Take 20 mg by mouth as needed (1-5  PO prn).    . TraZODone HCl 150 MG TB24 Take by mouth at bedtime.    Marland Kitchen VITAMIN E PO Take 90 Units by mouth daily.    Marland Kitchen HYDROcodone-acetaminophen (NORCO/VICODIN) 5-325 MG tablet Take 1-2 tablets by mouth every 6 (six) hours as needed for moderate pain. (Patient not taking: Reported on 03/18/2018) 20 tablet 0  . tiZANidine (ZANAFLEX) 4 MG tablet TAKE 1 TABLET (4 MG TOTAL) BY MOUTH 2 (TWO) TIMES DAILY. 60 tablet 3   No facility-administered medications prior to visit.     Review of Systems CNS: No confusion or sedation Cardiac: No angina or palpitations GI: No abdominal pain or constipation Constitutional: No nausea vomiting fevers or chills  Objective:  BP (!) 143/93   Pulse 61   Temp 98.6 F (37 C)   Resp 16   Ht 5\' 9"  (1.753 m)   Wt 210 lb (95.3 kg)   SpO2 96%   BMI 31.01 kg/m  BP Readings from Last 3 Encounters:  05/11/18 (!) 143/93  03/18/18 135/90  02/16/18 125/75     Wt Readings from Last 3 Encounters:  05/11/18 210 lb (95.3 kg)  03/18/18 214 lb (97.1 kg)  02/16/18 220 lb (99.8 kg)     Physical Exam Pt is alert and oriented PERRL EOMI HEART IS RRR no murmur or rub LCTA no wheezing or rales MUSCULOSKELETAL reveals some paraspinous muscle tenderness in the lumbar region but no overt trigger points.  His muscle tone and bulk is at baseline.  Labs  Lab Results  Component Value Date   HGBA1C 9.0 03/04/2010   Lab Results  Component Value Date   CREATININE 0.92 08/21/2016    -------------------------------------------------------------------------------------------------------------------- Lab Results  Component Value Date   WBC 9.2 08/21/2016   HGB 15.3 08/21/2016   HCT 43.6 08/21/2016    PLT 219 08/21/2016   GLUCOSE 132 (H) 08/21/2016   ALT 31 03/04/2010   AST 23 03/04/2010   NA 135 08/21/2016   K 3.7 08/21/2016   CL 100 (L) 08/21/2016   CREATININE 0.92 08/21/2016   BUN 11 08/21/2016   CO2 26 08/21/2016   PSA 0.26 03/04/2010   HGBA1C 9.0 03/04/2010    --------------------------------------------------------------------------------------------------------------------- Dg C-arm 1-60 Min-no Report  Result Date: 05/11/2018 Fluoroscopy was utilized by the requesting physician.  No radiographic interpretation.     Assessment & Plan:   Peter Lamb was seen today for back pain.  Diagnoses and all orders for this visit:  Chronic left-sided low back pain with left-sided sciatica  Chronic pain syndrome  DDD (degenerative disc disease), lumbar  Long term current use of opiate analgesic  Facet arthritis of lumbar region  Lumbar spondylosis with myelopathy  Musculoskeletal pain  Neurogenic pain  Other orders -     Discontinue: oxyCODONE ER (XTAMPZA ER) 36 MG C12A; Take 1 capsule (36 mg total) by mouth 2 (two) times daily. -     Discontinue: oxyCODONE ER (XTAMPZA ER) 36 MG C12A; Take 1 capsule (36 mg total) by mouth 2 (two) times daily. -     triamcinolone acetonide (KENALOG-40) injection 40 mg -     sodium chloride flush (NS) 0.9 % injection 10 mL -     ropivacaine (PF) 2 mg/mL (0.2%) (NAROPIN) injection 10 mL -     lidocaine (PF) (XYLOCAINE) 1 % injection 5 mL -     iopamidol (ISOVUE-M) 41 % intrathecal injection 20 mL -     Discontinue: oxyCODONE ER (XTAMPZA ER) 36 MG C12A; Take 2 capsules (72 mg total) by mouth 2 (two) times daily. -     oxyCODONE ER (XTAMPZA ER) 36 MG C12A; Take 2 capsules (72 mg total) by mouth 2 (two) times daily.        ----------------------------------------------------------------------------------------------------------------------  Problem List Items Addressed This Visit      Unprioritized   Chronic low back pain - Primary  (Chronic)   Relevant Medications   triamcinolone acetonide (KENALOG-40) injection 40 mg (Completed)   oxyCODONE ER (XTAMPZA ER) 36 MG C12A   Chronic pain (Chronic)   Relevant Medications   triamcinolone acetonide (KENALOG-40) injection 40 mg (Completed)   ropivacaine (PF) 2 mg/mL (0.2%) (NAROPIN) injection 10 mL (Completed)   lidocaine (PF) (XYLOCAINE) 1 % injection 5 mL (Completed)   oxyCODONE ER (XTAMPZA ER) 36 MG C12A   DDD (degenerative disc disease), lumbar   Relevant Medications   triamcinolone acetonide (KENALOG-40) injection 40 mg (Completed)   oxyCODONE ER (XTAMPZA ER) 36 MG C12A  Long term current use of opiate analgesic (Chronic)   Musculoskeletal pain (Chronic)   Neurogenic pain (Chronic)    Other Visit Diagnoses    Facet arthritis of lumbar region       Relevant Medications   triamcinolone acetonide (KENALOG-40) injection 40 mg (Completed)   oxyCODONE ER (XTAMPZA ER) 36 MG C12A   Lumbar spondylosis with myelopathy            ----------------------------------------------------------------------------------------------------------------------  1. Chronic left-sided low back pain with left-sided sciatica We will proceed with his third epidural injection today as he is making good progress.  We have gone over the risks and benefits of the procedure with him in full detail and all questions are answered.  Want him to continue with core stretching strengthening exercises as prescribed.  2. Chronic pain syndrome Continue with his current medication regimen.  He is taking his long-acting oxycodone 2 tablets twice daily with refills given today for October 23 and November 22 and return to clinic in 2 months.  We have reviewed the [facility] practitioner database information and it is appropriate.  3. DDD (degenerative disc disease), lumbar   4. Long term current use of opiate analgesic   5. Facet arthritis of lumbar region   6. Lumbar spondylosis with  myelopathy   7. Musculoskeletal pain Continue core stretching strengthening  8. Neurogenic pain 10 you follow-up with his primary care physicians    ----------------------------------------------------------------------------------------------------------------------  I am having Eulises N. Simonin maintain his aspirin, metFORMIN, sertraline, atenolol, diltiazem, lisinopril, Multiple Vitamins-Minerals (VITAMINS TO GO MEN PO), rosuvastatin, TraZODone HCl, glipiZIDE, nitroGLYCERIN, sertraline, sildenafil, Fish Oil, APPLE CIDER VINEGAR PO, VITAMIN E PO, Melatonin, HYDROcodone-acetaminophen, tiZANidine, and oxyCODONE ER. We administered triamcinolone acetonide, sodium chloride flush, ropivacaine (PF) 2 mg/mL (0.2%), lidocaine (PF), and iopamidol.   Meds ordered this encounter  Medications  . DISCONTD: oxyCODONE ER (XTAMPZA ER) 36 MG C12A    Sig: Take 1 capsule (36 mg total) by mouth 2 (two) times daily.    Dispense:  60 capsule    Refill:  0    Do not fill until [id number]  . DISCONTD: oxyCODONE ER (XTAMPZA ER) 36 MG C12A    Sig: Take 1 capsule (36 mg total) by mouth 2 (two) times daily.    Dispense:  60 capsule    Refill:  0    Do not fill until [id number]  . triamcinolone acetonide (KENALOG-40) injection 40 mg  . sodium chloride flush (NS) 0.9 % injection 10 mL  . ropivacaine (PF) 2 mg/mL (0.2%) (NAROPIN) injection 10 mL  . lidocaine (PF) (XYLOCAINE) 1 % injection 5 mL  . iopamidol (ISOVUE-M) 41 % intrathecal injection 20 mL  . DISCONTD: oxyCODONE ER (XTAMPZA ER) 36 MG C12A    Sig: Take 2 capsules (72 mg total) by mouth 2 (two) times daily.    Dispense:  120 capsule    Refill:  0    Do not fill until [id number]  . oxyCODONE ER (XTAMPZA ER) 36 MG C12A    Sig: Take 2 capsules (72 mg total) by mouth 2 (two) times daily.    Dispense:  120 capsule    Refill:  0    Do not fill until 1023 2019   Patient's Medications  New Prescriptions   OXYCODONE ER (XTAMPZA ER) 36 MG C12A    Take 2  capsules (72 mg total) by mouth 2 (two) times daily.  Previous Medications   APPLE CIDER VINEGAR PO    Take 480 mg by  mouth daily.   ASPIRIN 81 MG TABLET    Take 162 mg by mouth daily.   ATENOLOL (TENORMIN) 100 MG TABLET    Take 50 mg by mouth 2 (two) times daily.   DILTIAZEM (DILTIAZEM CD) 240 MG 24 HR CAPSULE    Take 240 mg by mouth daily.   GLIPIZIDE (GLUCOTROL) 10 MG TABLET    Take 10 mg 2 (two) times daily by mouth.   HYDROCODONE-ACETAMINOPHEN (NORCO/VICODIN) 5-325 MG TABLET    Take 1-2 tablets by mouth every 6 (six) hours as needed for moderate pain.   LISINOPRIL (PRINIVIL,ZESTRIL) 20 MG TABLET    Take 20 mg by mouth daily.   MELATONIN 10 MG TABS    Take by mouth at bedtime as needed.   METFORMIN (GLUCOPHAGE) 500 MG TABLET    Take 500 mg by mouth daily with breakfast. Reported on 02/10/2016   MULTIPLE VITAMINS-MINERALS (VITAMINS TO GO MEN PO)    Take 1 tablet by mouth daily.    NITROGLYCERIN (NITROSTAT) 0.4 MG SL TABLET    Place 1 tablet under tongue as needed for chest pain (may repeat every 5 minutes but seek medical help if pain persists after 3 tablets) as needed   OMEGA-3 FATTY ACIDS (FISH OIL) 1000 MG CAPS    Take by mouth daily.   ROSUVASTATIN (CRESTOR) 10 MG TABLET    Take 10 mg by mouth daily.   SERTRALINE (ZOLOFT) 100 MG TABLET    Take 200 mg by mouth daily.   SERTRALINE (ZOLOFT) 50 MG TABLET    Take 100 mg by mouth at bedtime.    SILDENAFIL (REVATIO) 20 MG TABLET    Take 20 mg by mouth as needed (1-5  PO prn).   TIZANIDINE (ZANAFLEX) 4 MG TABLET    TAKE 1 TABLET (4 MG TOTAL) BY MOUTH 2 (TWO) TIMES DAILY.   TRAZODONE HCL 150 MG TB24    Take by mouth at bedtime.   VITAMIN E PO    Take 90 Units by mouth daily.  Modified Medications   No medications on file  Discontinued Medications   No medications on file   ----------------------------------------------------------------------------------------------------------------------  Follow-up: Return for evaluation, med refill.    Procedure:L 5 S1 LESI with fluoroscopic guidance and no moderate sedation  NOTE: The risks, benefits, and expectations of the procedure have been discussed and explained to the patient who was understanding and in agreement with suggested treatment plan. No guarantees were made.  DESCRIPTION OF PROCEDURE: Lumbar epidural steroid injection with no IV Versed, EKG, blood pressure, pulse, and pulse oximetry monitoring. The procedure was performed with the patient in the prone position under fluoroscopic guidance.  Sterile prep x3 was initiated and I then injected subcutaneous lidocaine to the overlying L5-S1 site after its fluoroscopic identifictation.  Using strict aseptic technique, I then advanced an 18-gauge Tuohy epidural needle in the midline using interlaminar approach via loss-of-resistance to saline technique. There was negative aspiration for heme or  CSF.  I then confirmed position with both AP and Lateral fluoroscan.  2 cc of Isovue were injected and a  total of 5 mL of Preservative-Free normal saline mixed with 40 mg of Kenalog and 1cc Ropicaine 0.2 percent were injected incrementally via the  epidurally placed needle. The needle was removed. The patient tolerated the injection well and was convalesced and discharged to home in stable condition. Should the patient have any post procedure difficulty they have been instructed on how to contact us for assistance.  Molli Barrows, MD

## 2018-05-12 ENCOUNTER — Telehealth: Payer: Self-pay | Admitting: *Deleted

## 2018-05-12 NOTE — Telephone Encounter (Signed)
Attempted to call for post procedure follow-up. Message left. 

## 2018-05-23 ENCOUNTER — Other Ambulatory Visit: Payer: Self-pay | Admitting: Anesthesiology

## 2018-05-23 DIAGNOSIS — M7918 Myalgia, other site: Secondary | ICD-10-CM

## 2018-07-07 ENCOUNTER — Other Ambulatory Visit: Payer: Self-pay

## 2018-07-07 ENCOUNTER — Encounter: Payer: Self-pay | Admitting: Anesthesiology

## 2018-07-07 ENCOUNTER — Ambulatory Visit: Payer: Medicare Other | Attending: Anesthesiology | Admitting: Anesthesiology

## 2018-07-07 VITALS — BP 171/94 | HR 51 | Temp 97.9°F | Resp 16 | Ht 69.0 in | Wt 213.0 lb

## 2018-07-07 DIAGNOSIS — M47816 Spondylosis without myelopathy or radiculopathy, lumbar region: Secondary | ICD-10-CM | POA: Insufficient documentation

## 2018-07-07 DIAGNOSIS — M5136 Other intervertebral disc degeneration, lumbar region: Secondary | ICD-10-CM | POA: Diagnosis not present

## 2018-07-07 DIAGNOSIS — M4716 Other spondylosis with myelopathy, lumbar region: Secondary | ICD-10-CM | POA: Insufficient documentation

## 2018-07-07 DIAGNOSIS — M7918 Myalgia, other site: Secondary | ICD-10-CM | POA: Insufficient documentation

## 2018-07-07 DIAGNOSIS — M792 Neuralgia and neuritis, unspecified: Secondary | ICD-10-CM | POA: Insufficient documentation

## 2018-07-07 DIAGNOSIS — G8929 Other chronic pain: Secondary | ICD-10-CM | POA: Insufficient documentation

## 2018-07-07 DIAGNOSIS — M5442 Lumbago with sciatica, left side: Secondary | ICD-10-CM | POA: Diagnosis not present

## 2018-07-07 DIAGNOSIS — Z79891 Long term (current) use of opiate analgesic: Secondary | ICD-10-CM | POA: Insufficient documentation

## 2018-07-07 DIAGNOSIS — G894 Chronic pain syndrome: Secondary | ICD-10-CM | POA: Diagnosis not present

## 2018-07-07 MED ORDER — OXYCODONE ER 36 MG PO C12A: 1.0000 | EXTENDED_RELEASE_CAPSULE | Freq: Two times a day (BID) | ORAL | 0 refills | Status: DC

## 2018-07-07 NOTE — Patient Instructions (Signed)
Paper scripts were handed to patient X2 for EXtampza.

## 2018-07-07 NOTE — Progress Notes (Signed)
Nursing Pain Medication Assessment:  Safety precautions to be maintained throughout the outpatient stay will include: orient to surroundings, keep bed in low position, maintain call bell within reach at all times, provide assistance with transfer out of bed and ambulation.  Medication Inspection Compliance: Pill count conducted under aseptic conditions, in front of the patient. Neither the pills nor the bottle was removed from the patient's sight at any time. Once count was completed pills were immediately returned to the patient in their original bottle.  Medication: Oxycodone IR Pill/Patch Count: 38 of 120 pills remain Pill/Patch Appearance: Markings consistent with prescribed medication Bottle Appearance: Standard pharmacy container. Clearly labeled. Filled Date: 11/22 / 2018 Last Medication intake:  Today

## 2018-07-11 NOTE — Progress Notes (Signed)
Subjective:  Patient ID: Peter Lamb, male    DOB: February 14, 1956  Age: 62 y.o. MRN: [id number]  CC: No chief complaint on file.   Procedure: None  HPI Peter Lamb presents for reevaluation.  He was last seen approximately 2 months ago and has been doing well with his current Xtampza ER regimen.  The quality characteristic distribution of his low back pain have been stable in nature.  No changes in lower extremity strength or function or bowel bladder function are noted at this time.  He is responded favorably to previous epidural injections and this pain relief persist.  His strength is at baseline.  In review of his narcotic assessment sheet he continues to derive good improvement with the medications with no untoward side effects noted.  Outpatient Medications Prior to Visit  Medication Sig Dispense Refill  . APPLE CIDER VINEGAR PO Take 480 mg by mouth daily.    Marland Kitchen aspirin 81 MG tablet Take 162 mg by mouth daily.    Marland Kitchen atenolol (TENORMIN) 100 MG tablet Take 50 mg by mouth 2 (two) times daily.    Marland Kitchen diltiazem (DILTIAZEM CD) 240 MG 24 hr capsule Take 240 mg by mouth daily.    Marland Kitchen glipiZIDE (GLUCOTROL) 10 MG tablet Take 10 mg 2 (two) times daily by mouth.  3  . glucosamine-chondroitin 500-400 MG tablet Take 1 tablet by mouth daily.    Marland Kitchen lisinopril (PRINIVIL,ZESTRIL) 20 MG tablet Take 20 mg by mouth daily.    . Melatonin 10 MG TABS Take by mouth at bedtime as needed.    . metFORMIN (GLUCOPHAGE) 500 MG tablet Take 500 mg by mouth daily with breakfast. Reported on 02/10/2016    . Multiple Vitamins-Minerals (VITAMINS TO GO MEN PO) Take 1 tablet by mouth daily.     . nitroGLYCERIN (NITROSTAT) 0.4 MG SL tablet Place 1 tablet under tongue as needed for chest pain (may repeat every 5 minutes but seek medical help if pain persists after 3 tablets) as needed    . Omega-3 Fatty Acids (FISH OIL) 1000 MG CAPS Take by mouth daily.    . rosuvastatin (CRESTOR) 10 MG tablet Take 10 mg by mouth daily.    .  sertraline (ZOLOFT) 100 MG tablet Take 200 mg by mouth daily.  0  . sertraline (ZOLOFT) 50 MG tablet Take 100 mg by mouth at bedtime.     . sildenafil (REVATIO) 20 MG tablet Take 20 mg by mouth as needed (1-5  PO prn).    . TraZODone HCl 150 MG TB24 Take by mouth at bedtime.    Marland Kitchen VITAMIN E PO Take 90 Units by mouth daily.    Marland Kitchen HYDROcodone-acetaminophen (NORCO/VICODIN) 5-325 MG tablet Take 1-2 tablets by mouth every 6 (six) hours as needed for moderate pain. (Patient not taking: Reported on 03/18/2018) 20 tablet 0  . tiZANidine (ZANAFLEX) 4 MG tablet TAKE 1 TABLET (4 MG TOTAL) BY MOUTH 2 (TWO) TIMES DAILY. 60 tablet 3   No facility-administered medications prior to visit.     Review of Systems CNS: No confusion or sedation Cardiac: No angina or palpitations GI: No abdominal pain or constipation Constitutional: No nausea vomiting fevers or chills  Objective:  BP (!) 171/94   Pulse (!) 51   Temp 97.9 F (36.6 C) (Oral)   Resp 16   Ht 5\' 9"  (1.753 m)   Wt 213 lb (96.6 kg)   SpO2 98%   BMI 31.45 kg/m    BP Readings from  Last 3 Encounters:  07/07/18 (!) 171/94  05/11/18 (!) 143/93  03/18/18 135/90     Wt Readings from Last 3 Encounters:  07/07/18 213 lb (96.6 kg)  05/11/18 210 lb (95.3 kg)  03/18/18 214 lb (97.1 kg)     Physical Exam Pt is alert and oriented PERRL EOMI HEART IS RRR no murmur or rub LCTA no wheezing or rales MUSCULOSKELETAL reveals some persistent low back pain with no overt trigger points.  He is ambulating with an antalgic gait.  His muscle tone and bulk is at baseline.  Labs  Lab Results  Component Value Date   HGBA1C 9.0 03/04/2010   Lab Results  Component Value Date   CREATININE 0.92 08/21/2016    -------------------------------------------------------------------------------------------------------------------- Lab Results  Component Value Date   WBC 9.2 08/21/2016   HGB 15.3 08/21/2016   HCT 43.6 08/21/2016   PLT 219 08/21/2016    GLUCOSE 132 (H) 08/21/2016   ALT 31 03/04/2010   AST 23 03/04/2010   NA 135 08/21/2016   K 3.7 08/21/2016   CL 100 (L) 08/21/2016   CREATININE 0.92 08/21/2016   BUN 11 08/21/2016   CO2 26 08/21/2016   PSA 0.26 03/04/2010   HGBA1C 9.0 03/04/2010    --------------------------------------------------------------------------------------------------------------------- Dg C-arm 1-60 Min-no Report  Result Date: 05/11/2018 Fluoroscopy was utilized by the requesting physician.  No radiographic interpretation.     Assessment & Plan:   Diagnoses and all orders for this visit:  Chronic left-sided low back pain with left-sided sciatica  Chronic pain syndrome  Long term current use of opiate analgesic  DDD (degenerative disc disease), lumbar  Facet arthritis of lumbar region  Lumbar spondylosis with myelopathy  Musculoskeletal pain  Neurogenic pain  Other orders -     Discontinue: oxyCODONE ER (XTAMPZA ER) 36 MG C12A; Take 1 capsule (36 mg total) by mouth 2 (two) times daily. -     Discontinue: oxyCODONE ER (XTAMPZA ER) 36 MG C12A; Take 1 capsule (36 mg total) by mouth 2 (two) times daily. -     oxyCODONE ER (XTAMPZA ER) 36 MG C12A; Take 1 capsule (36 mg total) by mouth 2 (two) times daily.        ----------------------------------------------------------------------------------------------------------------------  Problem List Items Addressed This Visit      Unprioritized   Chronic low back pain - Primary (Chronic)   Relevant Medications   oxyCODONE ER (XTAMPZA ER) 36 MG C12A (Start on 08/16/2018)   Chronic pain (Chronic)   Relevant Medications   oxyCODONE ER (XTAMPZA ER) 36 MG C12A (Start on 08/16/2018)   DDD (degenerative disc disease), lumbar   Relevant Medications   oxyCODONE ER (XTAMPZA ER) 36 MG C12A (Start on 08/16/2018)   Long term current use of opiate analgesic (Chronic)   Musculoskeletal pain (Chronic)   Neurogenic pain (Chronic)    Other Visit Diagnoses     Facet arthritis of lumbar region       Relevant Medications   oxyCODONE ER (XTAMPZA ER) 36 MG C12A (Start on 08/16/2018)   Lumbar spondylosis with myelopathy            ----------------------------------------------------------------------------------------------------------------------  1. Chronic left-sided low back pain with left-sided sciatica We will defer on any repeat injections at this time and refill his medications today.  These seem to be helping him with his chronic intractable low back pain.  Refills will be given for December 22 and January 21 with return to clinic in 2 months.  We have reviewed the [facility] practitioner  database information and it is appropriate.  2. Chronic pain syndrome Continue with core stretching strengthening as previously reviewed and once again reviewed today.  3. Long term current use of opiate analgesic As above  4. DDD (degenerative disc disease), lumbar   5. Facet arthritis of lumbar region   6. Lumbar spondylosis with myelopathy   7. Musculoskeletal pain   8. Neurogenic pain 10 you follow-up with primary care physicians.    ----------------------------------------------------------------------------------------------------------------------  I am having Peter Lamb maintain his aspirin, metFORMIN, sertraline, atenolol, diltiazem, lisinopril, Multiple Vitamins-Minerals (VITAMINS TO GO MEN PO), rosuvastatin, TraZODone HCl, glipiZIDE, nitroGLYCERIN, sertraline, sildenafil, Fish Oil, APPLE CIDER VINEGAR PO, VITAMIN E PO, Melatonin, HYDROcodone-acetaminophen, tiZANidine, glucosamine-chondroitin, and oxyCODONE ER.   Meds ordered this encounter  Medications  . DISCONTD: oxyCODONE ER (XTAMPZA ER) 36 MG C12A    Sig: Take 1 capsule (36 mg total) by mouth 2 (two) times daily.    Dispense:  60 capsule    Refill:  0    30 day supply to fill on [id number]  . DISCONTD: oxyCODONE ER (XTAMPZA ER) 36 MG C12A    Sig: Take 1  capsule (36 mg total) by mouth 2 (two) times daily.    Dispense:  60 capsule    Refill:  0    30 day supply  . oxyCODONE ER (XTAMPZA ER) 36 MG C12A    Sig: Take 1 capsule (36 mg total) by mouth 2 (two) times daily.    Dispense:  60 capsule    Refill:  0    30 day supply.... do not fill until [id number]   Patient's Medications  New Prescriptions   OXYCODONE ER (XTAMPZA ER) 36 MG C12A    Take 1 capsule (36 mg total) by mouth 2 (two) times daily.  Previous Medications   APPLE CIDER VINEGAR PO    Take 480 mg by mouth daily.   ASPIRIN 81 MG TABLET    Take 162 mg by mouth daily.   ATENOLOL (TENORMIN) 100 MG TABLET    Take 50 mg by mouth 2 (two) times daily.   DILTIAZEM (DILTIAZEM CD) 240 MG 24 HR CAPSULE    Take 240 mg by mouth daily.   GLIPIZIDE (GLUCOTROL) 10 MG TABLET    Take 10 mg 2 (two) times daily by mouth.   GLUCOSAMINE-CHONDROITIN 500-400 MG TABLET    Take 1 tablet by mouth daily.   HYDROCODONE-ACETAMINOPHEN (NORCO/VICODIN) 5-325 MG TABLET    Take 1-2 tablets by mouth every 6 (six) hours as needed for moderate pain.   LISINOPRIL (PRINIVIL,ZESTRIL) 20 MG TABLET    Take 20 mg by mouth daily.   MELATONIN 10 MG TABS    Take by mouth at bedtime as needed.   METFORMIN (GLUCOPHAGE) 500 MG TABLET    Take 500 mg by mouth daily with breakfast. Reported on 02/10/2016   MULTIPLE VITAMINS-MINERALS (VITAMINS TO GO MEN PO)    Take 1 tablet by mouth daily.    NITROGLYCERIN (NITROSTAT) 0.4 MG SL TABLET    Place 1 tablet under tongue as needed for chest pain (may repeat every 5 minutes but seek medical help if pain persists after 3 tablets) as needed   OMEGA-3 FATTY ACIDS (FISH OIL) 1000 MG CAPS    Take by mouth daily.   ROSUVASTATIN (CRESTOR) 10 MG TABLET    Take 10 mg by mouth daily.   SERTRALINE (ZOLOFT) 100 MG TABLET    Take 200 mg by mouth daily.   SERTRALINE (ZOLOFT) 50 MG  TABLET    Take 100 mg by mouth at bedtime.    SILDENAFIL (REVATIO) 20 MG TABLET    Take 20 mg by mouth as needed (1-5  PO prn).    TIZANIDINE (ZANAFLEX) 4 MG TABLET    TAKE 1 TABLET (4 MG TOTAL) BY MOUTH 2 (TWO) TIMES DAILY.   TRAZODONE HCL 150 MG TB24    Take by mouth at bedtime.   VITAMIN E PO    Take 90 Units by mouth daily.  Modified Medications   No medications on file  Discontinued Medications   No medications on file   ----------------------------------------------------------------------------------------------------------------------  Follow-up: Return in about 2 months (around 09/07/2018) for evaluation, med refill.    Molli Barrows, MD

## 2018-07-18 MED ORDER — OXYCODONE ER 36 MG PO C12A: 2.0000 | EXTENDED_RELEASE_CAPSULE | Freq: Two times a day (BID) | ORAL | 0 refills | Status: DC

## 2018-07-18 NOTE — Addendum Note (Signed)
Addended by: Molli Barrows on: 07/18/2018 02:17 PM   Modules accepted: Orders

## 2018-07-18 NOTE — Addendum Note (Signed)
Addended by: Molli Barrows on: 07/18/2018 03:48 PM   Modules accepted: Orders

## 2018-08-01 ENCOUNTER — Telehealth: Payer: Self-pay | Admitting: *Deleted

## 2018-08-01 ENCOUNTER — Telehealth: Payer: Self-pay | Admitting: Anesthesiology

## 2018-08-01 MED ORDER — OXYCODONE ER 36 MG PO C12A: 2.0000 | EXTENDED_RELEASE_CAPSULE | Freq: Two times a day (BID) | ORAL | 0 refills | Status: AC

## 2018-08-01 MED ORDER — OXYCODONE ER 36 MG PO C12A: 2.0000 | EXTENDED_RELEASE_CAPSULE | Freq: Two times a day (BID) | ORAL | 0 refills | Status: DC

## 2018-08-01 NOTE — Telephone Encounter (Signed)
Last script was written for 1 tab bid, usually takes 2 tabs bid. Will speak with Dr. Andree Elk about this today.

## 2018-08-01 NOTE — Telephone Encounter (Signed)
Pt called and stated that he needs someone to call [facility] on [address] phone number [phone] to give them permission to fill a prescription that was written wrong.

## 2018-08-01 NOTE — Telephone Encounter (Signed)
Peter Lamb called stating his pharmacy needs authorization from Dr. Andree Elk to fill the script for today. Please call pharmacy and let patient know when he can get filled.

## 2018-08-01 NOTE — Addendum Note (Signed)
Addended by: Molli Barrows on: 08/01/2018 02:34 PM   Modules accepted: Orders

## 2018-08-01 NOTE — Telephone Encounter (Signed)
Pharmacy needs permission to fill 15 day script today. The last script was to last only 15 days.

## 2018-08-02 ENCOUNTER — Other Ambulatory Visit: Payer: Self-pay | Admitting: Anesthesiology

## 2018-08-02 DIAGNOSIS — M7918 Myalgia, other site: Secondary | ICD-10-CM

## 2018-08-10 ENCOUNTER — Other Ambulatory Visit: Payer: Self-pay | Admitting: *Deleted

## 2018-08-10 ENCOUNTER — Telehealth: Payer: Self-pay | Admitting: Anesthesiology

## 2018-08-10 NOTE — Telephone Encounter (Signed)
Spoke in depth with patient and he is upset because on several occasions he states he has had to make several encounters here due to incorrect scripts. Informed him that we all need to be accountable for the scripts and their accuracy before discharge. This includes patient, physician and staff. One month supply of Z[facility]aflex authorized at [facility] [place]. Confirmed Feb. appointment with patient.

## 2018-08-10 NOTE — Telephone Encounter (Signed)
Pt called and stated he needs a refill of tanzinine. [facility].

## 2018-09-01 ENCOUNTER — Other Ambulatory Visit: Payer: Self-pay | Admitting: Anesthesiology

## 2018-09-01 DIAGNOSIS — M7918 Myalgia, other site: Secondary | ICD-10-CM

## 2018-09-07 ENCOUNTER — Other Ambulatory Visit: Payer: Self-pay

## 2018-09-07 ENCOUNTER — Encounter: Payer: Self-pay | Admitting: Anesthesiology

## 2018-09-07 ENCOUNTER — Ambulatory Visit: Payer: Medicare Other | Attending: Anesthesiology | Admitting: Anesthesiology

## 2018-09-07 VITALS — BP 172/80 | HR 55 | Temp 98.1°F | Resp 18 | Ht 70.0 in | Wt 205.0 lb

## 2018-09-07 DIAGNOSIS — M5136 Other intervertebral disc degeneration, lumbar region: Secondary | ICD-10-CM | POA: Diagnosis present

## 2018-09-07 DIAGNOSIS — M5442 Lumbago with sciatica, left side: Secondary | ICD-10-CM | POA: Diagnosis not present

## 2018-09-07 DIAGNOSIS — M4716 Other spondylosis with myelopathy, lumbar region: Secondary | ICD-10-CM

## 2018-09-07 DIAGNOSIS — Z79891 Long term (current) use of opiate analgesic: Secondary | ICD-10-CM | POA: Insufficient documentation

## 2018-09-07 DIAGNOSIS — G8929 Other chronic pain: Secondary | ICD-10-CM | POA: Diagnosis present

## 2018-09-07 DIAGNOSIS — G894 Chronic pain syndrome: Secondary | ICD-10-CM | POA: Insufficient documentation

## 2018-09-07 DIAGNOSIS — M7918 Myalgia, other site: Secondary | ICD-10-CM | POA: Diagnosis present

## 2018-09-07 DIAGNOSIS — M47816 Spondylosis without myelopathy or radiculopathy, lumbar region: Secondary | ICD-10-CM | POA: Insufficient documentation

## 2018-09-07 MED ORDER — OXYCODONE ER 36 MG PO C12A: 2.0000 | EXTENDED_RELEASE_CAPSULE | Freq: Two times a day (BID) | ORAL | 0 refills | Status: AC

## 2018-09-07 MED ORDER — OXYCODONE ER 36 MG PO C12A: 2.0000 | EXTENDED_RELEASE_CAPSULE | Freq: Two times a day (BID) | ORAL | 0 refills | Status: DC

## 2018-09-07 MED ORDER — CYCLOBENZAPRINE HCL 10 MG PO TABS: 10.0000 mg | ORAL_TABLET | Freq: Two times a day (BID) | ORAL | 5 refills | Status: AC

## 2018-09-07 NOTE — Patient Instructions (Signed)
GENERAL RISKS AND COMPLICATIONS  What are the risk, side effects and possible complications? Generally speaking, most procedures are safe.  However, with any procedure there are risks, side effects, and the possibility of complications.  The risks and complications are dependent upon the sites that are lesioned, or the type of nerve block to be performed.  The closer the procedure is to the spine, the more serious the risks are.  Great care is taken when placing the radio frequency needles, block needles or lesioning probes, but sometimes complications can occur. 1. Infection: Any time there is an injection through the skin, there is a risk of infection.  This is why sterile conditions are used for these blocks.  There are four possible types of infection. 1. Localized skin infection. 2. Central Nervous System Infection-This can be in the form of Meningitis, which can be deadly. 3. Epidural Infections-This can be in the form of an epidural abscess, which can cause pressure inside of the spine, causing compression of the spinal cord with subsequent paralysis. This would require an emergency surgery to decompress, and there are no guarantees that the patient would recover from the paralysis. 4. Discitis-This is an infection of the intervertebral discs.  It occurs in about 1% of discography procedures.  It is difficult to treat and it may lead to surgery.        2. Pain: the needles have to go through skin and soft tissues, will cause soreness.       3. Damage to internal structures:  The nerves to be lesioned may be near blood vessels or    other nerves which can be potentially damaged.       4. Bleeding: Bleeding is more common if the patient is taking blood thinners such as  aspirin, Coumadin, Ticiid, Plavix, etc., or if he/she have some genetic predisposition  such as hemophilia. Bleeding into the spinal canal can cause compression of the spinal  cord with subsequent paralysis.  This would require an  emergency surgery to  decompress and there are no guarantees that the patient would recover from the  paralysis.       5. Pneumothorax:  Puncturing of a lung is a possibility, every time a needle is introduced in  the area of the chest or upper back.  Pneumothorax refers to free air around the  collapsed lung(s), inside of the thoracic cavity (chest cavity).  Another two possible  complications related to a similar event would include: Hemothorax and Chylothorax.   These are variations of the Pneumothorax, where instead of air around the collapsed  lung(s), you may have blood or chyle, respectively.       6. Spinal headaches: They may occur with any procedures in the area of the spine.       7. Persistent CSF (Cerebro-Spinal Fluid) leakage: This is a rare problem, but may occur  with prolonged intrathecal or epidural catheters either due to the formation of a fistulous  track or a dural tear.       8. Nerve damage: By working so close to the spinal cord, there is always a possibility of  nerve damage, which could be as serious as a permanent spinal cord injury with  paralysis.       9. Death:  Although rare, severe deadly allergic reactions known as "Anaphylactic  reaction" can occur to any of the medications used.      10. Worsening of the symptoms:  We can always make thing worse.    What are the chances of something like this happening? Chances of any of this occuring are extremely low.  By statistics, you have more of a chance of getting killed in a motor vehicle accident: while driving to the hospital than any of the above occurring .  Nevertheless, you should be aware that they are possibilities.  In general, it is similar to taking a shower.  Everybody knows that you can slip, hit your head and get killed.  Does that mean that you should not shower again?  Nevertheless always keep in mind that statistics do not mean anything if you happen to be on the wrong side of them.  Even if a procedure has a 1  (one) in a 1,000,000 (million) chance of going wrong, it you happen to be that one..Also, keep in mind that by statistics, you have more of a chance of having something go wrong when taking medications.  Who should not have this procedure? If you are on a blood thinning medication (e.g. Coumadin, Plavix, see list of "Blood Thinners"), or if you have an active infection going on, you should not have the procedure.  If you are taking any blood thinners, please inform your physician.  How should I prepare for this procedure?  Do not eat or drink anything at least six hours prior to the procedure.  Bring a driver with you .  It cannot be a taxi.  Come accompanied by an adult that can drive you back, and that is strong enough to help you if your legs get weak or numb from the local anesthetic.  Take all of your medicines the morning of the procedure with just enough water to swallow them.  If you have diabetes, make sure that you are scheduled to have your procedure done first thing in the morning, whenever possible.  If you have diabetes, take only half of your insulin dose and notify our nurse that you have done so as soon as you arrive at the clinic.  If you are diabetic, but only take blood sugar pills (oral hypoglycemic), then do not take them on the morning of your procedure.  You may take them after you have had the procedure.  Do not take aspirin or any aspirin-containing medications, at least eleven (11) days prior to the procedure.  They may prolong bleeding.  Wear loose fitting clothing that may be easy to take off and that you would not mind if it got stained with Betadine or blood.  Do not wear any jewelry or perfume  Remove any nail coloring.  It will interfere with some of our monitoring equipment.  NOTE: Remember that this is not meant to be interpreted as a complete list of all possible complications.  Unforeseen problems may occur.  BLOOD THINNERS The following drugs  contain aspirin or other products, which can cause increased bleeding during surgery and should not be taken for 2 weeks prior to and 1 week after surgery.  If you should need take something for relief of minor pain, you may take acetaminophen which is found in Tylenol,m Datril, Anacin-3 and Panadol. It is not blood thinner. The products listed below are.  Do not take any of the products listed below in addition to any listed on your instruction sheet.  A.P.C or A.P.C with Codeine Codeine Phosphate Capsules #3 Ibuprofen Ridaura  ABC compound Congesprin Imuran rimadil  Advil Cope Indocin Robaxisal  Alka-Seltzer Effervescent Pain Reliever and Antacid Coricidin or Coricidin-D  Indomethacin Rufen    Alka-Seltzer plus Cold Medicine Cosprin Ketoprofen S-A-C Tablets  Anacin Analgesic Tablets or Capsules Coumadin Korlgesic Salflex  Anacin Extra Strength Analgesic tablets or capsules CP-2 Tablets Lanoril Salicylate  Anaprox Cuprimine Capsules Levenox Salocol  Anexsia-D Dalteparin Magan Salsalate  Anodynos Darvon compound Magnesium Salicylate Sine-off  Ansaid Dasin Capsules Magsal Sodium Salicylate  Anturane Depen Capsules Marnal Soma  APF Arthritis pain formula Dewitt's Pills Measurin Stanback  Argesic Dia-Gesic Meclofenamic Sulfinpyrazone  Arthritis Bayer Timed Release Aspirin Diclofenac Meclomen Sulindac  Arthritis pain formula Anacin Dicumarol Medipren Supac  Analgesic (Safety coated) Arthralgen Diffunasal Mefanamic Suprofen  Arthritis Strength Bufferin Dihydrocodeine Mepro Compound Suprol  Arthropan liquid Dopirydamole Methcarbomol with Aspirin Synalgos  ASA tablets/Enseals Disalcid Micrainin Tagament  Ascriptin Doan's Midol Talwin  Ascriptin A/D Dolene Mobidin Tanderil  Ascriptin Extra Strength Dolobid Moblgesic Ticlid  Ascriptin with Codeine Doloprin or Doloprin with Codeine Momentum Tolectin  Asperbuf Duoprin Mono-gesic Trendar  Aspergum Duradyne Motrin or Motrin IB Triminicin  Aspirin  plain, buffered or enteric coated Durasal Myochrisine Trigesic  Aspirin Suppositories Easprin Nalfon Trillsate  Aspirin with Codeine Ecotrin Regular or Extra Strength Naprosyn Uracel  Atromid-S Efficin Naproxen Ursinus  Auranofin Capsules Elmiron Neocylate Vanquish  Axotal Emagrin Norgesic Verin  Azathioprine Empirin or Empirin with Codeine Normiflo Vitamin E  Azolid Emprazil Nuprin Voltaren  Bayer Aspirin plain, buffered or children's or timed BC Tablets or powders Encaprin Orgaran Warfarin Sodium  Buff-a-Comp Enoxaparin Orudis Zorpin  Buff-a-Comp with Codeine Equegesic Os-Cal-Gesic   Buffaprin Excedrin plain, buffered or Extra Strength Oxalid   Bufferin Arthritis Strength Feldene Oxphenbutazone   Bufferin plain or Extra Strength Feldene Capsules Oxycodone with Aspirin   Bufferin with Codeine Fenoprofen Fenoprofen Pabalate or Pabalate-SF   Buffets II Flogesic Panagesic   Buffinol plain or Extra Strength Florinal or Florinal with Codeine Panwarfarin   Buf-Tabs Flurbiprofen Penicillamine   Butalbital Compound Four-way cold tablets Penicillin   Butazolidin Fragmin Pepto-Bismol   Carbenicillin Geminisyn Percodan   Carna Arthritis Reliever Geopen Persantine   Carprofen Gold's salt Persistin   Chloramphenicol Goody's Phenylbutazone   Chloromycetin Haltrain Piroxlcam   Clmetidine heparin Plaquenil   Cllnoril Hyco-pap Ponstel   Clofibrate Hydroxy chloroquine Propoxyphen         Before stopping any of these medications, be sure to consult the physician who ordered them.  Some, such as Coumadin (Warfarin) are ordered to prevent or treat serious conditions such as "deep thrombosis", "pumonary embolisms", and other heart problems.  The amount of time that you may need off of the medication may also vary with the medication and the reason for which you were taking it.  If you are taking any of these medications, please make sure you notify your pain physician before you undergo any  procedures.         Pain Management Discharge Instructions  General Discharge Instructions :  If you need to reach your doctor call: Monday-Friday 8:00 am - 4:00 pm at [phone] or toll free [phone].  After clinic hours [phone] to have operator reach doctor.  Bring all of your medication bottles to all your appointments in the pain clinic.  To cancel or reschedule your appointment with Pain Management please remember to call 24 hours in advance to avoid a fee.  Refer to the educational materials which you have been given on: General Risks, I had my Procedure. Discharge Instructions, Post Sedation.  Post Procedure Instructions:  The drugs you were given will stay in your system until tomorrow, so for the next 24 hours you should   not drive, make any legal decisions or drink any alcoholic beverages.  You may eat anything you prefer, but it is better to start with liquids then soups and crackers, and gradually work up to solid foods.  Please notify your doctor immediately if you have any unusual bleeding, trouble breathing or pain that is not related to your normal pain.  Depending on the type of procedure that was done, some parts of your body may feel week and/or numb.  This usually clears up by tonight or the next day.  Walk with the use of an assistive device or accompanied by an adult for the 24 hours.  You may use ice on the affected area for the first 24 hours.  Put ice in a Ziploc bag and cover with a towel and place against area 15 minutes on 15 minutes off.  You may switch to heat after 24 hours.Epidural Steroid Injection Patient Information  Description: The epidural space surrounds the nerves as they exit the spinal cord.  In some patients, the nerves can be compressed and inflamed by a bulging disc or a tight spinal canal (spinal stenosis).  By injecting steroids into the epidural space, we can bring irritated nerves into direct contact with a potentially  helpful medication.  These steroids act directly on the irritated nerves and can reduce swelling and inflammation which often leads to decreased pain.  Epidural steroids may be injected anywhere along the spine and from the neck to the low back depending upon the location of your pain.   After numbing the skin with local anesthetic (like Novocaine), a small needle is passed into the epidural space slowly.  You may experience a sensation of pressure while this is being done.  The entire block usually last less than 10 minutes.  Conditions which may be treated by epidural steroids:   Low back and leg pain  Neck and arm pain  Spinal stenosis  Post-laminectomy syndrome  Herpes zoster (shingles) pain  Pain from compression fractures  Preparation for the injection:  1. Do not eat any solid food or dairy products within 8 hours of your appointment.  2. You may drink clear liquids up to 3 hours before appointment.  Clear liquids include water, black coffee, juice or soda.  No milk or cream please. 3. You may take your regular medication, including pain medications, with a sip of water before your appointment  Diabetics should hold regular insulin (if taken separately) and take 1/2 normal NPH dos the morning of the procedure.  Carry some sugar containing items with you to your appointment. 4. A driver must accompany you and be prepared to drive you home after your procedure.  5. Bring all your current medications with your. 6. An IV may be inserted and sedation may be given at the discretion of the physician.   7. A blood pressure cuff, EKG and other monitors will often be applied during the procedure.  Some patients may need to have extra oxygen administered for a short period. 8. You will be asked to provide medical information, including your allergies, prior to the procedure.  We must know immediately if you are taking blood thinners (like Coumadin/Warfarin)  Or if you are allergic to IV iodine  contrast (dye). We must know if you could possible be pregnant.  Possible side-effects:  Bleeding from needle site  Infection (rare, may require surgery)  Nerve injury (rare)  Numbness & tingling (temporary)  Difficulty urinating (rare, temporary)  Spinal headache (   a headache worse with upright posture)  Light -headedness (temporary)  Pain at injection site (several days)  Decreased blood pressure (temporary)  Weakness in arm/leg (temporary)  Pressure sensation in back/neck (temporary)  Call if you experience:  Fever/chills associated with headache or increased back/neck pain.  Headache worsened by an upright position.  New onset weakness or numbness of an extremity below the injection site  Hives or difficulty breathing (go to the emergency room)  Inflammation or drainage at the infection site  Severe back/neck pain  Any new symptoms which are concerning to you  Please note:  Although the local anesthetic injected can often make your back or neck feel good for several hours after the injection, the pain will likely return.  It takes 3-7 days for steroids to work in the epidural space.  You may not notice any pain relief for at least that one week.  If effective, we will often do a series of three injections spaced 3-6 weeks apart to maximally decrease your pain.  After the initial series, we generally will wait several months before considering a repeat injection of the same type.  If you have any questions, please call [phone] [facility]

## 2018-09-07 NOTE — Progress Notes (Signed)
Nursing Pain Medication Assessment:  Safety precautions to be maintained throughout the outpatient stay will include: orient to surroundings, keep bed in low position, maintain call bell within reach at all times, provide assistance with transfer out of bed and ambulation.  Medication Inspection Compliance: Peter Lamb did not comply with our request to bring his pills to be counted. He was reminded that bringing the medication bottles, even when empty, is a requirement.  Medication: None brought in. Pill/Patch Count: None available to be counted. Bottle Appearance: No container available. Did not bring bottle(s) to appointment. Filled Date: N/A Last Medication intake:  pt reports to having 30 pills left in the bottle

## 2018-09-08 NOTE — Progress Notes (Signed)
Subjective:  Patient ID: Peter Lamb, male    DOB: 01/14/1956  Age: 63 y.o. MRN: [id number]  CC: Medication Refill   Procedure: None  HPI Peter Lamb presents for reevaluation.  Peter Lamb was last seen 2 months ago and is been doing well with his Xtamza medication.  No changes in the quality characteristic or distribution of his pain are otherwise reported.  Peter Lamb has had epidurals in the past for his right lower leg pain and right posterior lateral calf and leg tenderness.  This has increased in intensity but the quality is of the same as previously reported.  In the past Peter Lamb has had epidurals that knockout approximately 75% of the pain to 100% of his lower leg pain and also 50 to 70% of his low back pain lasting approximately 6 to 8 weeks.  Peter Lamb inquires whether Peter Lamb might undergo a repeat epidural or should begin scheduling these more routinely.  No change in lower extremity strength or function or bowel bladder function are noted.  Based on his narcotic assessment sheet Peter Lamb continues to derive good functional lifestyle improvement with the medications.  Outpatient Medications Prior to Visit  Medication Sig Dispense Refill  . APPLE CIDER VINEGAR PO Take 480 mg by mouth daily.    Marland Kitchen aspirin 81 MG tablet Take 162 mg by mouth daily.    Marland Kitchen atenolol (TENORMIN) 100 MG tablet Take 50 mg by mouth 2 (two) times daily.    Marland Kitchen diltiazem (DILTIAZEM CD) 240 MG 24 hr capsule Take 240 mg by mouth daily.    Marland Kitchen glipiZIDE (GLUCOTROL) 10 MG tablet Take 10 mg 2 (two) times daily by mouth.  3  . glucosamine-chondroitin 500-400 MG tablet Take 1 tablet by mouth daily.    Marland Kitchen lisinopril (PRINIVIL,ZESTRIL) 20 MG tablet Take 20 mg by mouth daily.    . Melatonin 10 MG TABS Take by mouth at bedtime as needed.    . metFORMIN (GLUCOPHAGE) 500 MG tablet Take 500 mg by mouth daily with breakfast. Reported on 02/10/2016    . Multiple Vitamins-Minerals (VITAMINS TO GO MEN PO) Take 1 tablet by mouth daily.     . nitroGLYCERIN (NITROSTAT)  0.4 MG SL tablet Place 1 tablet under tongue as needed for chest pain (may repeat every 5 minutes but seek medical help if pain persists after 3 tablets) as needed    . Omega-3 Fatty Acids (FISH OIL) 1000 MG CAPS Take by mouth daily.    . rosuvastatin (CRESTOR) 10 MG tablet Take 10 mg by mouth daily.    . sertraline (ZOLOFT) 100 MG tablet Take 200 mg by mouth daily.  0  . sertraline (ZOLOFT) 50 MG tablet Take 100 mg by mouth at bedtime.     . sildenafil (REVATIO) 20 MG tablet Take 20 mg by mouth as needed (1-5  PO prn).    . TraZODone HCl 150 MG TB24 Take by mouth at bedtime.    Marland Kitchen VITAMIN E PO Take 90 Units by mouth daily.    Marland Kitchen HYDROcodone-acetaminophen (NORCO/VICODIN) 5-325 MG tablet Take 1-2 tablets by mouth every 6 (six) hours as needed for moderate pain. (Patient not taking: Reported on 09/07/2018) 20 tablet 0  . tiZANidine (ZANAFLEX) 4 MG tablet TAKE 1 TABLET (4 MG TOTAL) BY MOUTH 2 (TWO) TIMES DAILY. 60 tablet 3   No facility-administered medications prior to visit.     Review of Systems CNS: No confusion or sedation Cardiac: No angina or palpitations GI: No abdominal pain or constipation  Constitutional: No nausea vomiting fevers or chills  Objective:  BP (!) 172/80   Pulse (!) 55   Temp 98.1 F (36.7 C)   Resp 18   Ht 5\' 10"  (1.778 m)   Wt 205 lb (93 kg)   SpO2 97%   BMI 29.41 kg/m    BP Readings from Last 3 Encounters:  09/07/18 (!) 172/80  07/07/18 (!) 171/94  05/11/18 (!) 143/93     Wt Readings from Last 3 Encounters:  09/07/18 205 lb (93 kg)  07/07/18 213 lb (96.6 kg)  05/11/18 210 lb (95.3 kg)     Physical Exam Peter Lamb is alert and oriented PERRL EOMI HEART IS RRR no murmur or rub LCTA no wheezing or rales MUSCULOSKELETAL reveals some paraspinous muscle tenderness.  No overt trigger points are noted.  Peter Lamb walks with an antalgic gait.  Peter Lamb does have sciatica-like symptoms on the left side.  Labs  Lab Results  Component Value Date   HGBA1C 9.0 03/04/2010    Lab Results  Component Value Date   CREATININE 0.92 08/21/2016    -------------------------------------------------------------------------------------------------------------------- Lab Results  Component Value Date   WBC 9.2 08/21/2016   HGB 15.3 08/21/2016   HCT 43.6 08/21/2016   PLT 219 08/21/2016   GLUCOSE 132 (H) 08/21/2016   ALT 31 03/04/2010   AST 23 03/04/2010   NA 135 08/21/2016   K 3.7 08/21/2016   CL 100 (L) 08/21/2016   CREATININE 0.92 08/21/2016   BUN 11 08/21/2016   CO2 26 08/21/2016   PSA 0.26 03/04/2010   HGBA1C 9.0 03/04/2010    --------------------------------------------------------------------------------------------------------------------- Dg C-arm 1-60 Min-no Report  Result Date: 05/11/2018 Fluoroscopy was utilized by the requesting physician.  No radiographic interpretation.     Assessment & Plan:   Leona was seen today for medication refill.  Diagnoses and all orders for this visit:  Chronic left-sided low back pain with left-sided sciatica -     Lumbar Epidural Injection; Future  Chronic pain syndrome  Long term current use of opiate analgesic  DDD (degenerative disc disease), lumbar  Facet arthritis of lumbar region  Lumbar spondylosis with myelopathy -     Lumbar Epidural Injection; Future  Musculoskeletal pain  Other orders -     oxyCODONE ER (XTAMPZA ER) 36 MG C12A; Take 2 capsules (72 mg total) by mouth 2 (two) times daily for 30 days. -     oxyCODONE ER (XTAMPZA ER) 36 MG C12A; Take 2 capsules (72 mg total) by mouth 2 (two) times daily for 30 days. -     cyclobenzaprine (FLEXERIL) 10 MG tablet; Take 1 tablet (10 mg total) by mouth 2 (two) times daily for 30 days.        ----------------------------------------------------------------------------------------------------------------------  Problem List Items Addressed This Visit      Unprioritized   Chronic low back pain - Primary (Chronic)   Relevant Medications    oxyCODONE ER (XTAMPZA ER) 36 MG C12A (Start on 09/15/2018)   oxyCODONE ER (XTAMPZA ER) 36 MG C12A (Start on 10/15/2018)   cyclobenzaprine (FLEXERIL) 10 MG tablet   Other Relevant Orders   Lumbar Epidural Injection   Chronic pain (Chronic)   Relevant Medications   oxyCODONE ER (XTAMPZA ER) 36 MG C12A (Start on 09/15/2018)   oxyCODONE ER (XTAMPZA ER) 36 MG C12A (Start on 10/15/2018)   cyclobenzaprine (FLEXERIL) 10 MG tablet   DDD (degenerative disc disease), lumbar   Relevant Medications   oxyCODONE ER (XTAMPZA ER) 36 MG C12A (Start on 09/15/2018)  oxyCODONE ER (XTAMPZA ER) 36 MG C12A (Start on 10/15/2018)   cyclobenzaprine (FLEXERIL) 10 MG tablet   Long term current use of opiate analgesic (Chronic)   Musculoskeletal pain (Chronic)    Other Visit Diagnoses    Facet arthritis of lumbar region       Relevant Medications   oxyCODONE ER (XTAMPZA ER) 36 MG C12A (Start on 09/15/2018)   oxyCODONE ER (XTAMPZA ER) 36 MG C12A (Start on 10/15/2018)   cyclobenzaprine (FLEXERIL) 10 MG tablet   Lumbar spondylosis with myelopathy       Relevant Orders   Lumbar Epidural Injection        ----------------------------------------------------------------------------------------------------------------------  1. Chronic left-sided low back pain with left-sided sciatica We will schedule him for a repeat epidural at his next visit.  We have gone over the risks and benefits of this procedure with him in full detail and all questions are answered. - Lumbar Epidural Injection; Future  2. Chronic pain syndrome We will continue on his current medication regimen.  This seems to be working well for him and keeping his pain under good control.  Refills will be given for February 20 and March 21  3. Long term current use of opiate analgesic We have reviewed the [facility] practitioner database information and it is appropriate.  4. DDD (degenerative disc disease), lumbar Continue core stretching and  strengthening exercises.  5. Facet arthritis of lumbar region   6. Lumbar spondylosis with myelopathy  - Lumbar Epidural Injection; Future  7. Musculoskeletal pain     ----------------------------------------------------------------------------------------------------------------------  I am having Peter Lamb start on oxyCODONE ER, oxyCODONE ER, and cyclobenzaprine. I am also having him maintain his aspirin, metFORMIN, sertraline, atenolol, diltiazem, lisinopril, Multiple Vitamins-Minerals (VITAMINS TO GO MEN PO), rosuvastatin, TraZODone HCl, glipiZIDE, nitroGLYCERIN, sertraline, sildenafil, Fish Oil, APPLE CIDER VINEGAR PO, VITAMIN E PO, Melatonin, HYDROcodone-acetaminophen, tiZANidine, and glucosamine-chondroitin.   Meds ordered this encounter  Medications  . oxyCODONE ER (XTAMPZA ER) 36 MG C12A    Sig: Take 2 capsules (72 mg total) by mouth 2 (two) times daily for 30 days.    Dispense:  120 capsule    Refill:  0    30 day supply no early refill  . oxyCODONE ER (XTAMPZA ER) 36 MG C12A    Sig: Take 2 capsules (72 mg total) by mouth 2 (two) times daily for 30 days.    Dispense:  120 capsule    Refill:  0    30 day supply no early refill  . cyclobenzaprine (FLEXERIL) 10 MG tablet    Sig: Take 1 tablet (10 mg total) by mouth 2 (two) times daily for 30 days.    Dispense:  60 tablet    Refill:  5   Patient's Medications  New Prescriptions   CYCLOBENZAPRINE (FLEXERIL) 10 MG TABLET    Take 1 tablet (10 mg total) by mouth 2 (two) times daily for 30 days.   OXYCODONE ER (XTAMPZA ER) 36 MG C12A    Take 2 capsules (72 mg total) by mouth 2 (two) times daily for 30 days.   OXYCODONE ER (XTAMPZA ER) 36 MG C12A    Take 2 capsules (72 mg total) by mouth 2 (two) times daily for 30 days.  Previous Medications   APPLE CIDER VINEGAR PO    Take 480 mg by mouth daily.   ASPIRIN 81 MG TABLET    Take 162 mg by mouth daily.   ATENOLOL (TENORMIN) 100 MG TABLET    Take 50  mg by mouth 2 (two)  times daily.   DILTIAZEM (DILTIAZEM CD) 240 MG 24 HR CAPSULE    Take 240 mg by mouth daily.   GLIPIZIDE (GLUCOTROL) 10 MG TABLET    Take 10 mg 2 (two) times daily by mouth.   GLUCOSAMINE-CHONDROITIN 500-400 MG TABLET    Take 1 tablet by mouth daily.   HYDROCODONE-ACETAMINOPHEN (NORCO/VICODIN) 5-325 MG TABLET    Take 1-2 tablets by mouth every 6 (six) hours as needed for moderate pain.   LISINOPRIL (PRINIVIL,ZESTRIL) 20 MG TABLET    Take 20 mg by mouth daily.   MELATONIN 10 MG TABS    Take by mouth at bedtime as needed.   METFORMIN (GLUCOPHAGE) 500 MG TABLET    Take 500 mg by mouth daily with breakfast. Reported on 02/10/2016   MULTIPLE VITAMINS-MINERALS (VITAMINS TO GO MEN PO)    Take 1 tablet by mouth daily.    NITROGLYCERIN (NITROSTAT) 0.4 MG SL TABLET    Place 1 tablet under tongue as needed for chest pain (may repeat every 5 minutes but seek medical help if pain persists after 3 tablets) as needed   OMEGA-3 FATTY ACIDS (FISH OIL) 1000 MG CAPS    Take by mouth daily.   ROSUVASTATIN (CRESTOR) 10 MG TABLET    Take 10 mg by mouth daily.   SERTRALINE (ZOLOFT) 100 MG TABLET    Take 200 mg by mouth daily.   SERTRALINE (ZOLOFT) 50 MG TABLET    Take 100 mg by mouth at bedtime.    SILDENAFIL (REVATIO) 20 MG TABLET    Take 20 mg by mouth as needed (1-5  PO prn).   TIZANIDINE (ZANAFLEX) 4 MG TABLET    TAKE 1 TABLET (4 MG TOTAL) BY MOUTH 2 (TWO) TIMES DAILY.   TRAZODONE HCL 150 MG TB24    Take by mouth at bedtime.   VITAMIN E PO    Take 90 Units by mouth daily.  Modified Medications   No medications on file  Discontinued Medications   No medications on file   ----------------------------------------------------------------------------------------------------------------------  Follow-up: Return in about 1 week (around 09/14/2018) for LESI.    Molli Barrows, MD

## 2018-09-15 ENCOUNTER — Telehealth: Payer: Self-pay

## 2018-09-15 MED ORDER — OXYCODONE ER 36 MG PO C12A: 2.0000 | EXTENDED_RELEASE_CAPSULE | Freq: Two times a day (BID) | ORAL | 0 refills | Status: DC

## 2018-09-15 NOTE — Telephone Encounter (Signed)
Script was sent to [facility], and they don't have enough to do the refill. [facility] to another [facility], so Dr. Andree Elk needs to send another script to [facility] and its due today. He is angry and wanted to speak to a nurse but I told him none was available. He said "this happens all the time and he has to keep calling and nothing ever gets done". He said between [place] and the pharmacy he is surprised anything ever gets done. Please call, he is very adamant that this gets done today.

## 2018-09-15 NOTE — Telephone Encounter (Signed)
Will get Dr. Andree Elk to send script to [facility] location. Script to fill today at [facility] [address] was canceled by me. Patient called and informed.

## 2018-09-15 NOTE — Addendum Note (Signed)
Addended by: Molli Barrows on: 09/15/2018 01:11 PM   Modules accepted: Orders

## 2018-10-03 ENCOUNTER — Other Ambulatory Visit: Payer: Self-pay | Admitting: Anesthesiology

## 2018-10-03 ENCOUNTER — Ambulatory Visit (HOSPITAL_BASED_OUTPATIENT_CLINIC_OR_DEPARTMENT_OTHER): Payer: Medicare Other | Admitting: Anesthesiology

## 2018-10-03 ENCOUNTER — Ambulatory Visit
Admission: RE | Admit: 2018-10-03 | Discharge: 2018-10-03 | Disposition: A | Discharge status: Home / Self Care | Payer: Medicare Other | Source: Ambulatory Visit | Attending: Anesthesiology | Admitting: Anesthesiology

## 2018-10-03 ENCOUNTER — Encounter: Payer: Self-pay | Admitting: Anesthesiology

## 2018-10-03 VITALS — BP 147/90 | HR 61 | Temp 97.6°F | Resp 16 | Ht 70.0 in | Wt 205.0 lb

## 2018-10-03 DIAGNOSIS — M5442 Lumbago with sciatica, left side: Secondary | ICD-10-CM | POA: Diagnosis present

## 2018-10-03 DIAGNOSIS — M4716 Other spondylosis with myelopathy, lumbar region: Secondary | ICD-10-CM | POA: Diagnosis present

## 2018-10-03 DIAGNOSIS — G8929 Other chronic pain: Secondary | ICD-10-CM | POA: Diagnosis present

## 2018-10-03 DIAGNOSIS — R52 Pain, unspecified: Secondary | ICD-10-CM | POA: Diagnosis present

## 2018-10-03 DIAGNOSIS — M5136 Other intervertebral disc degeneration, lumbar region: Secondary | ICD-10-CM | POA: Diagnosis present

## 2018-10-03 DIAGNOSIS — Z79891 Long term (current) use of opiate analgesic: Secondary | ICD-10-CM | POA: Diagnosis present

## 2018-10-03 DIAGNOSIS — G894 Chronic pain syndrome: Secondary | ICD-10-CM | POA: Insufficient documentation

## 2018-10-03 MED ORDER — ROPIVACAINE HCL 2 MG/ML IJ SOLN
INTRAMUSCULAR | Status: AC
  Filled 2018-10-03: qty 10

## 2018-10-03 MED ORDER — OXYCODONE ER 36 MG PO C12A: 2.0000 | EXTENDED_RELEASE_CAPSULE | Freq: Two times a day (BID) | ORAL | 0 refills | Status: DC

## 2018-10-03 MED ORDER — SODIUM CHLORIDE (PF) 0.9 % IJ SOLN
INTRAMUSCULAR | Status: AC
  Filled 2018-10-03: qty 10

## 2018-10-03 MED ORDER — LIDOCAINE HCL 2 % IJ SOLN
INTRAMUSCULAR | Status: AC
  Filled 2018-10-03: qty 20

## 2018-10-03 MED ORDER — TRIAMCINOLONE ACETONIDE 40 MG/ML IJ SUSP
40.0000 mg | Freq: Once | INTRAMUSCULAR | Status: AC
  Administered 2018-10-03: 40 mg

## 2018-10-03 MED ORDER — SODIUM CHLORIDE 0.9% FLUSH
10.0000 mL | Freq: Once | INTRAVENOUS | Status: AC
  Administered 2018-10-03: 10 mL

## 2018-10-03 MED ORDER — IOPAMIDOL (ISOVUE-M 200) INJECTION 41%
20.0000 mL | Freq: Once | INTRAMUSCULAR | Status: DC | PRN
  Administered 2018-10-03 (×2): 10 mL
  Filled 2018-10-03 (×2): qty 20

## 2018-10-03 MED ORDER — IOPAMIDOL (ISOVUE-M 200) INJECTION 41%
INTRAMUSCULAR | Status: AC
  Filled 2018-10-03: qty 10

## 2018-10-03 MED ORDER — LIDOCAINE HCL (PF) 1 % IJ SOLN
5.0000 mL | Freq: Once | INTRAMUSCULAR | Status: AC
  Administered 2018-10-03: 5 mL via SUBCUTANEOUS

## 2018-10-03 MED ORDER — TRIAMCINOLONE ACETONIDE 40 MG/ML IJ SUSP
INTRAMUSCULAR | Status: AC
  Filled 2018-10-03: qty 1

## 2018-10-03 MED ORDER — ROPIVACAINE HCL 2 MG/ML IJ SOLN
10.0000 mL | Freq: Once | INTRAMUSCULAR | Status: AC
  Administered 2018-10-03: 10 mL via EPIDURAL

## 2018-10-03 NOTE — Progress Notes (Signed)
Safety precautions to be maintained throughout the outpatient stay will include: orient to surroundings, keep bed in low position, maintain call bell within reach at all times, provide assistance with transfer out of bed and ambulation.  

## 2018-10-03 NOTE — Patient Instructions (Signed)

## 2018-10-03 NOTE — Progress Notes (Signed)
Subjective:  Patient ID: Peter Lamb, male    DOB: [birth year]  Age: 63 y.o. MRN: [id number]  CC: Procedure and Back Pain   Procedure: L5-S1 epidural steroid without sedation  HPI Peter Lamb presents for reevaluation.  He was last seen 2 months ago and has had a gradual recurrence of his primarily left side lower leg pain calf pain and low back pain.  In the past he is received periodic epidural steroids that have helped significantly with this and he presents today requesting that.  He is taking his medication as prescribed and this is working well for him based on his narcotic assessment sheet.  Otherwise he denies any changes in the quality characteristic or distribution of his low back pain or leg pain.  His bowel bladder function is been stable as well.  Outpatient Medications Prior to Visit  Medication Sig Dispense Refill  . APPLE CIDER VINEGAR PO Take 480 mg by mouth daily.    Marland Kitchen aspirin 81 MG tablet Take 162 mg by mouth daily.    Marland Kitchen atenolol (TENORMIN) 100 MG tablet Take 50 mg by mouth 2 (two) times daily.    . cyclobenzaprine (FLEXERIL) 10 MG tablet Take 1 tablet (10 mg total) by mouth 2 (two) times daily for 30 days. 60 tablet 5  . diltiazem (DILTIAZEM CD) 240 MG 24 hr capsule Take 240 mg by mouth daily.    Marland Kitchen glipiZIDE (GLUCOTROL) 10 MG tablet Take 10 mg 2 (two) times daily by mouth.  3  . glucosamine-chondroitin 500-400 MG tablet Take 1 tablet by mouth daily.    Marland Kitchen lisinopril (PRINIVIL,ZESTRIL) 20 MG tablet Take 20 mg by mouth daily.    . Melatonin 10 MG TABS Take by mouth at bedtime as needed.    . metFORMIN (GLUCOPHAGE) 500 MG tablet Take 500 mg by mouth daily with breakfast. Reported on 02/10/2016    . Multiple Vitamins-Minerals (VITAMINS TO GO MEN PO) Take 1 tablet by mouth daily.     . nitroGLYCERIN (NITROSTAT) 0.4 MG SL tablet Place 1 tablet under tongue as needed for chest pain (may repeat every 5 minutes but seek medical help if pain persists after 3 tablets) as  needed    . Omega-3 Fatty Acids (FISH OIL) 1000 MG CAPS Take by mouth daily.    Derrill Memo ON 10/15/2018] oxyCODONE ER (XTAMPZA ER) 36 MG C12A Take 2 capsules (72 mg total) by mouth 2 (two) times daily for 30 days. 120 capsule 0  . rosuvastatin (CRESTOR) 10 MG tablet Take 10 mg by mouth daily.    . sertraline (ZOLOFT) 100 MG tablet Take 200 mg by mouth daily.  0  . sertraline (ZOLOFT) 50 MG tablet Take 100 mg by mouth at bedtime.     . sildenafil (REVATIO) 20 MG tablet Take 20 mg by mouth as needed (1-5  PO prn).    . TraZODone HCl 150 MG TB24 Take by mouth at bedtime.    Marland Kitchen VITAMIN E PO Take 90 Units by mouth daily.    Marland Kitchen oxyCODONE ER (XTAMPZA ER) 36 MG C12A Take 2 capsules (72 mg total) by mouth 2 (two) times daily for 30 days. 120 capsule 0  . HYDROcodone-acetaminophen (NORCO/VICODIN) 5-325 MG tablet Take 1-2 tablets by mouth every 6 (six) hours as needed for moderate pain. (Patient not taking: Reported on 09/07/2018) 20 tablet 0  . tiZANidine (ZANAFLEX) 4 MG tablet TAKE 1 TABLET (4 MG TOTAL) BY MOUTH 2 (TWO) TIMES DAILY. 60 tablet 3  No facility-administered medications prior to visit.     Review of Systems CNS: No confusion or sedation Cardiac: No angina or palpitations GI: No abdominal pain or constipation Constitutional: No nausea vomiting fevers or chills  Objective:  BP (!) 147/90   Pulse 61   Temp 97.6 F (36.4 C)   Resp 16   Ht 5\' 10"  (1.778 m)   Wt 205 lb (93 kg)   SpO2 96%   BMI 29.41 kg/m    BP Readings from Last 3 Encounters:  10/03/18 (!) 147/90  09/07/18 (!) 172/80  07/07/18 (!) 171/94     Wt Readings from Last 3 Encounters:  10/03/18 205 lb (93 kg)  09/07/18 205 lb (93 kg)  07/07/18 213 lb (96.6 kg)     Physical Exam Pt is alert and oriented PERRL EOMI HEART IS RRR no murmur or rub LCTA no wheezing or rales MUSCULOSKELETAL reveals some paraspinous muscle tenderness but no overt trigger points.  He does have a positive straight leg raise on the left  side negative on the right and his muscle tone and bulk is at baseline.  Labs  Lab Results  Component Value Date   HGBA1C 9.0 03/04/2010   Lab Results  Component Value Date   CREATININE 0.92 08/21/2016    -------------------------------------------------------------------------------------------------------------------- Lab Results  Component Value Date   WBC 9.2 08/21/2016   HGB 15.3 08/21/2016   HCT 43.6 08/21/2016   PLT 219 08/21/2016   GLUCOSE 132 (H) 08/21/2016   ALT 31 03/04/2010   AST 23 03/04/2010   NA 135 08/21/2016   K 3.7 08/21/2016   CL 100 (L) 08/21/2016   CREATININE 0.92 08/21/2016   BUN 11 08/21/2016   CO2 26 08/21/2016   PSA 0.26 03/04/2010   HGBA1C 9.0 03/04/2010    --------------------------------------------------------------------------------------------------------------------- Dg C-arm 1-60 Min-no Report  Result Date: 10/03/2018 Fluoroscopy was utilized by the requesting physician.  No radiographic interpretation.     Assessment & Plan:   Peter Lamb was seen today for procedure and back pain.  Diagnoses and all orders for this visit:  Chronic left-sided low back pain with left-sided sciatica  Chronic pain syndrome  Long term current use of opiate analgesic  DDD (degenerative disc disease), lumbar  Lumbar spondylosis with myelopathy  Other orders -     oxyCODONE ER (XTAMPZA ER) 36 MG C12A; Take 2 capsules (72 mg total) by mouth 2 (two) times daily for 30 days. -     triamcinolone acetonide (KENALOG-40) injection 40 mg -     sodium chloride flush (NS) 0.9 % injection 10 mL -     ropivacaine (PF) 2 mg/mL (0.2%) (NAROPIN) injection 10 mL -     lidocaine (PF) (XYLOCAINE) 1 % injection 5 mL -     iopamidol (ISOVUE-M) 41 % intrathecal injection 20 mL        ----------------------------------------------------------------------------------------------------------------------  Problem List Items Addressed This Visit      Unprioritized    Chronic low back pain - Primary (Chronic)   Relevant Medications   oxyCODONE ER (XTAMPZA ER) 36 MG C12A (Start on 11/14/2018)   triamcinolone acetonide (KENALOG-40) injection 40 mg (Completed)   Chronic pain (Chronic)   Relevant Medications   oxyCODONE ER (XTAMPZA ER) 36 MG C12A (Start on 11/14/2018)   triamcinolone acetonide (KENALOG-40) injection 40 mg (Completed)   ropivacaine (PF) 2 mg/mL (0.2%) (NAROPIN) injection 10 mL (Completed)   lidocaine (PF) (XYLOCAINE) 1 % injection 5 mL (Completed)   DDD (degenerative disc disease), lumbar  Relevant Medications   oxyCODONE ER (XTAMPZA ER) 36 MG C12A (Start on 11/14/2018)   triamcinolone acetonide (KENALOG-40) injection 40 mg (Completed)   Long term current use of opiate analgesic (Chronic)    Other Visit Diagnoses    Lumbar spondylosis with myelopathy            ----------------------------------------------------------------------------------------------------------------------  1. Chronic left-sided low back pain with left-sided sciatica We will proceed with an epidural steroid today.  We have gone over the risks and benefits of the procedure with him in full detail and all questions answered.  He is scheduled for return in 2 months.  Continue back stretching strengthening exercises as tolerated.  2. Chronic pain syndrome We have reviewed the [facility] practitioner database information and it is appropriate.  He is scheduled for return in 2 months with prescriptions given today for March 21 and April 20.  3. Long term current use of opiate analgesic As above  4. DDD (degenerative disc disease), lumbar As above  5. Lumbar spondylosis with myelopathy As above    ----------------------------------------------------------------------------------------------------------------------  I am having Peter Lamb maintain his aspirin, metFORMIN, sertraline, atenolol, diltiazem, lisinopril, Multiple Vitamins-Minerals (VITAMINS  TO GO MEN PO), rosuvastatin, TraZODone HCl, glipiZIDE, nitroGLYCERIN, sertraline, sildenafil, Fish Oil, APPLE CIDER VINEGAR PO, VITAMIN E PO, Melatonin, HYDROcodone-acetaminophen, tiZANidine, glucosamine-chondroitin, oxyCODONE ER, cyclobenzaprine, and oxyCODONE ER. We administered triamcinolone acetonide, sodium chloride flush, ropivacaine (PF) 2 mg/mL (0.2%), lidocaine (PF), and iopamidol.   Meds ordered this encounter  Medications  . oxyCODONE ER (XTAMPZA ER) 36 MG C12A    Sig: Take 2 capsules (72 mg total) by mouth 2 (two) times daily for 30 days.    Dispense:  120 capsule    Refill:  0    30 day supply no early refill  . triamcinolone acetonide (KENALOG-40) injection 40 mg  . sodium chloride flush (NS) 0.9 % injection 10 mL  . ropivacaine (PF) 2 mg/mL (0.2%) (NAROPIN) injection 10 mL  . lidocaine (PF) (XYLOCAINE) 1 % injection 5 mL  . iopamidol (ISOVUE-M) 41 % intrathecal injection 20 mL   Patient's Medications  New Prescriptions   No medications on file  Previous Medications   APPLE CIDER VINEGAR PO    Take 480 mg by mouth daily.   ASPIRIN 81 MG TABLET    Take 162 mg by mouth daily.   ATENOLOL (TENORMIN) 100 MG TABLET    Take 50 mg by mouth 2 (two) times daily.   CYCLOBENZAPRINE (FLEXERIL) 10 MG TABLET    Take 1 tablet (10 mg total) by mouth 2 (two) times daily for 30 days.   DILTIAZEM (DILTIAZEM CD) 240 MG 24 HR CAPSULE    Take 240 mg by mouth daily.   GLIPIZIDE (GLUCOTROL) 10 MG TABLET    Take 10 mg 2 (two) times daily by mouth.   GLUCOSAMINE-CHONDROITIN 500-400 MG TABLET    Take 1 tablet by mouth daily.   HYDROCODONE-ACETAMINOPHEN (NORCO/VICODIN) 5-325 MG TABLET    Take 1-2 tablets by mouth every 6 (six) hours as needed for moderate pain.   LISINOPRIL (PRINIVIL,ZESTRIL) 20 MG TABLET    Take 20 mg by mouth daily.   MELATONIN 10 MG TABS    Take by mouth at bedtime as needed.   METFORMIN (GLUCOPHAGE) 500 MG TABLET    Take 500 mg by mouth daily with breakfast. Reported on 02/10/2016    MULTIPLE VITAMINS-MINERALS (VITAMINS TO GO MEN PO)    Take 1 tablet by mouth daily.    NITROGLYCERIN (NITROSTAT)  0.4 MG SL TABLET    Place 1 tablet under tongue as needed for chest pain (may repeat every 5 minutes but seek medical help if pain persists after 3 tablets) as needed   OMEGA-3 FATTY ACIDS (FISH OIL) 1000 MG CAPS    Take by mouth daily.   OXYCODONE ER (XTAMPZA ER) 36 MG C12A    Take 2 capsules (72 mg total) by mouth 2 (two) times daily for 30 days.   ROSUVASTATIN (CRESTOR) 10 MG TABLET    Take 10 mg by mouth daily.   SERTRALINE (ZOLOFT) 100 MG TABLET    Take 200 mg by mouth daily.   SERTRALINE (ZOLOFT) 50 MG TABLET    Take 100 mg by mouth at bedtime.    SILDENAFIL (REVATIO) 20 MG TABLET    Take 20 mg by mouth as needed (1-5  PO prn).   TIZANIDINE (ZANAFLEX) 4 MG TABLET    TAKE 1 TABLET (4 MG TOTAL) BY MOUTH 2 (TWO) TIMES DAILY.   TRAZODONE HCL 150 MG TB24    Take by mouth at bedtime.   VITAMIN E PO    Take 90 Units by mouth daily.  Modified Medications   Modified Medication Previous Medication   OXYCODONE ER (XTAMPZA ER) 36 MG C12A oxyCODONE ER (XTAMPZA ER) 36 MG C12A      Take 2 capsules (72 mg total) by mouth 2 (two) times daily for 30 days.    Take 2 capsules (72 mg total) by mouth 2 (two) times daily for 30 days.  Discontinued Medications   No medications on file   ----------------------------------------------------------------------------------------------------------------------  Follow-up: Return for evaluation, med refill.   Procedure: L5-S1 LESI with fluoroscopic guidance and no moderate sedation  NOTE: The risks, benefits, and expectations of the procedure have been discussed and explained to the patient who was understanding and in agreement with suggested treatment plan. No guarantees were made.  DESCRIPTION OF PROCEDURE: Lumbar epidural steroid injection with no IV Versed, EKG, blood pressure, pulse, and pulse oximetry monitoring. The procedure was performed with  the patient in the prone position under fluoroscopic guidance.  Sterile prep x3 was initiated and I then injected subcutaneous lidocaine to the overlying L5-S1 site after its fluoroscopic identifictation.  Using strict aseptic technique, I then advanced an 18-gauge Tuohy epidural needle in the midline using interlaminar approach via loss-of-resistance to saline technique. There was negative aspiration for heme or  CSF.  I then confirmed position with both AP and Lateral fluoroscan.  2 cc of Isovue were injected and a  total of 5 mL of Preservative-Free normal saline mixed with 40 mg of Kenalog and 1cc Ropicaine 0.2 percent were injected incrementally via the  epidurally placed needle. The needle was removed. The patient tolerated the injection well and was convalesced and discharged to home in stable condition. Should the patient have any post procedure difficulty they have been instructed on how to contact us for assistance.    Molli Barrows, MD

## 2018-10-04 ENCOUNTER — Telehealth: Payer: Self-pay

## 2018-11-14 ENCOUNTER — Telehealth: Payer: Self-pay | Admitting: *Deleted

## 2018-11-14 MED ORDER — OXYCODONE ER 36 MG PO C12A: 2.0000 | EXTENDED_RELEASE_CAPSULE | Freq: Two times a day (BID) | ORAL | 0 refills | Status: DC

## 2018-11-14 NOTE — Addendum Note (Signed)
Addended by: Molli Barrows on: 11/14/2018 03:52 PM   Modules accepted: Orders

## 2018-11-14 NOTE — Telephone Encounter (Signed)
Dr Andree Elk notified and states he will send in today after 3pm today.  Patient notified.

## 2018-12-06 ENCOUNTER — Other Ambulatory Visit: Payer: Self-pay

## 2018-12-06 ENCOUNTER — Ambulatory Visit: Payer: Medicare Other | Attending: Anesthesiology | Admitting: Anesthesiology

## 2018-12-06 ENCOUNTER — Encounter: Payer: Self-pay | Admitting: Anesthesiology

## 2018-12-06 DIAGNOSIS — Z79891 Long term (current) use of opiate analgesic: Secondary | ICD-10-CM

## 2018-12-06 DIAGNOSIS — M5136 Other intervertebral disc degeneration, lumbar region: Secondary | ICD-10-CM | POA: Diagnosis not present

## 2018-12-06 DIAGNOSIS — M5442 Lumbago with sciatica, left side: Secondary | ICD-10-CM | POA: Diagnosis not present

## 2018-12-06 DIAGNOSIS — G8929 Other chronic pain: Secondary | ICD-10-CM

## 2018-12-06 DIAGNOSIS — M51369 Other intervertebral disc degeneration, lumbar region without mention of lumbar back pain or lower extremity pain: Secondary | ICD-10-CM

## 2018-12-06 DIAGNOSIS — G894 Chronic pain syndrome: Secondary | ICD-10-CM

## 2018-12-06 DIAGNOSIS — M7918 Myalgia, other site: Secondary | ICD-10-CM

## 2018-12-06 DIAGNOSIS — M792 Neuralgia and neuritis, unspecified: Secondary | ICD-10-CM

## 2018-12-06 DIAGNOSIS — M47816 Spondylosis without myelopathy or radiculopathy, lumbar region: Secondary | ICD-10-CM

## 2018-12-06 DIAGNOSIS — M4716 Other spondylosis with myelopathy, lumbar region: Secondary | ICD-10-CM

## 2018-12-06 MED ORDER — OXYCODONE ER 36 MG PO C12A: 2.0000 | EXTENDED_RELEASE_CAPSULE | Freq: Two times a day (BID) | ORAL | 0 refills | Status: DC

## 2018-12-06 NOTE — Progress Notes (Signed)
Virtual Visit via Video Note  I connected with [name] on 12/06/18 at 11:30 AM EDT by a video enabled telemedicine application and verified that I am speaking with the correct person using two identifiers.  Location: Patient: Home Provider: Pain control center   I discussed the limitations of evaluation and management by telemedicine and the availability of in person appointments. The patient expressed understanding and agreed to proceed.  History of Present Illness: I spoke with [name] via telephone conference and he did well with his March epidural.  The lower leg pain has improved and he rates this at approximately 75% lasting the last 7 or 8 weeks.  He generally gets relief for 2 to 3 months and then has recurrence of the same quality characteristic and distribution of low back pain and leg pain.  He is taking his xtampza without difficulty and this continues to give him good relief of his chronic pain.  No other changes are reported and he is continuing to derive good functional lifestyle improvement with his medications.  He is doing a stretching strengthening exercises as tolerated.  His last refill was in April.  Otherwise he is currently    Observations/Objective: In his usual state of health  Current Outpatient Medications:  .  APPLE CIDER VINEGAR PO, Take 480 mg by mouth daily., Disp: , Rfl:  .  aspirin 81 MG tablet, Take 162 mg by mouth daily., Disp: , Rfl:  .  atenolol (TENORMIN) 100 MG tablet, Take 50 mg by mouth 2 (two) times daily., Disp: , Rfl:  .  diltiazem (DILTIAZEM CD) 240 MG 24 hr capsule, Take 240 mg by mouth daily., Disp: , Rfl:  .  glipiZIDE (GLUCOTROL) 10 MG tablet, Take 10 mg 2 (two) times daily by mouth., Disp: , Rfl: 3 .  glucosamine-chondroitin 500-400 MG tablet, Take 1 tablet by mouth daily., Disp: , Rfl:  .  HYDROcodone-acetaminophen (NORCO/VICODIN) 5-325 MG tablet, Take 1-2 tablets by mouth every 6 (six) hours as needed for moderate pain. (Patient not  taking: Reported on 09/07/2018), Disp: 20 tablet, Rfl: 0 .  lisinopril (PRINIVIL,ZESTRIL) 20 MG tablet, Take 20 mg by mouth daily., Disp: , Rfl:  .  Melatonin 10 MG TABS, Take by mouth at bedtime as needed., Disp: , Rfl:  .  metFORMIN (GLUCOPHAGE) 500 MG tablet, Take 500 mg by mouth daily with breakfast. Reported on 02/10/2016, Disp: , Rfl:  .  Multiple Vitamins-Minerals (VITAMINS TO GO MEN PO), Take 1 tablet by mouth daily. , Disp: , Rfl:  .  nitroGLYCERIN (NITROSTAT) 0.4 MG SL tablet, Place 1 tablet under tongue as needed for chest pain (may repeat every 5 minutes but seek medical help if pain persists after 3 tablets) as needed, Disp: , Rfl:  .  Omega-3 Fatty Acids (FISH OIL) 1000 MG CAPS, Take by mouth daily., Disp: , Rfl:  .  [START ON 12/14/2018] oxyCODONE ER (XTAMPZA ER) 36 MG C12A, Take 2 capsules (72 mg total) by mouth 2 (two) times daily for 30 days., Disp: 120 capsule, Rfl: 0 .  [START ON 01/13/2019] oxyCODONE ER (XTAMPZA ER) 36 MG C12A, Take 2 capsules (72 mg total) by mouth 2 (two) times daily for 30 days., Disp: 120 capsule, Rfl: 0 .  rosuvastatin (CRESTOR) 10 MG tablet, Take 10 mg by mouth daily., Disp: , Rfl:  .  sertraline (ZOLOFT) 100 MG tablet, Take 200 mg by mouth daily., Disp: , Rfl: 0 .  sertraline (ZOLOFT) 50 MG tablet, Take 100 mg by  mouth at bedtime. , Disp: , Rfl:  .  sildenafil (REVATIO) 20 MG tablet, Take 20 mg by mouth as needed (1-5  PO prn)., Disp: , Rfl:  .  tiZANidine (ZANAFLEX) 4 MG tablet, TAKE 1 TABLET (4 MG TOTAL) BY MOUTH 2 (TWO) TIMES DAILY., Disp: 60 tablet, Rfl: 3 .  TraZODone HCl 150 MG TB24, Take by mouth at bedtime., Disp: , Rfl:  .  VITAMIN E PO, Take 90 Units by mouth daily., Disp: , Rfl:    Assessment and Plan: 1. Chronic left-sided low back pain with left-sided sciatica   2. Chronic pain syndrome   3. Long term current use of opiate analgesic   4. DDD (degenerative disc disease), lumbar   5. Lumbar spondylosis with myelopathy   6. Facet arthritis of  lumbar region   7. Musculoskeletal pain   8. Neurogenic pain   Based on our discussion today we will refill his medications and these will be dated for May 20 and June 19.  I have reviewed the [facility] practitioner database information and it is appropriate.  He has asked for a reschedule of an epidural in 2 months secondary to the recurrence of his low back pain and calf pain and cramping.  Furthermore he is instructed to follow-up with his primary care physicians and refills will be provided today as above.   Follow Up Instructions: Return to clinic in 2 months    I discussed the assessment and treatment plan with the patient. The patient was provided an opportunity to ask questions and all were answered. The patient agreed with the plan and demonstrated an understanding of the instructions.   The patient was advised to call back or seek an in-person evaluation if the symptoms worsen or if the condition fails to improve as anticipated.  I provided 25 minutes of non-face-to-face time during this encounter.   Molli Barrows, MD

## 2018-12-14 ENCOUNTER — Telehealth: Payer: Self-pay | Admitting: *Deleted

## 2018-12-14 NOTE — Telephone Encounter (Signed)
Spoke with pharmacy and they state they have the script.  Patient notified.

## 2019-01-09 ENCOUNTER — Telehealth: Payer: Self-pay | Admitting: *Deleted

## 2019-01-10 ENCOUNTER — Ambulatory Visit: Payer: Medicare Other | Attending: Anesthesiology | Admitting: Anesthesiology

## 2019-01-10 ENCOUNTER — Encounter: Payer: Self-pay | Admitting: Anesthesiology

## 2019-01-10 ENCOUNTER — Other Ambulatory Visit: Payer: Self-pay

## 2019-01-10 DIAGNOSIS — M5136 Other intervertebral disc degeneration, lumbar region: Secondary | ICD-10-CM

## 2019-01-10 DIAGNOSIS — G894 Chronic pain syndrome: Secondary | ICD-10-CM

## 2019-01-10 DIAGNOSIS — Z79891 Long term (current) use of opiate analgesic: Secondary | ICD-10-CM

## 2019-01-10 DIAGNOSIS — M5387 Other specified dorsopathies, lumbosacral region: Secondary | ICD-10-CM | POA: Diagnosis not present

## 2019-01-10 DIAGNOSIS — M7918 Myalgia, other site: Secondary | ICD-10-CM

## 2019-01-10 DIAGNOSIS — G8929 Other chronic pain: Secondary | ICD-10-CM

## 2019-01-10 DIAGNOSIS — M47816 Spondylosis without myelopathy or radiculopathy, lumbar region: Secondary | ICD-10-CM

## 2019-01-10 DIAGNOSIS — M5442 Lumbago with sciatica, left side: Secondary | ICD-10-CM

## 2019-01-10 MED ORDER — XTAMPZA ER 36 MG PO C12A: 2.0000 | EXTENDED_RELEASE_CAPSULE | Freq: Two times a day (BID) | ORAL | 0 refills | Status: DC

## 2019-01-10 NOTE — Progress Notes (Signed)
Virtual Visit via Telephone Note  I connected with [name] on 01/10/19 at  3:30 PM EDT by telephone and verified that I am speaking with the correct person using two identifiers.  Location: Patient: Home Provider: Pain control center   I discussed the limitations, risks, security and privacy concerns of performing an evaluation and management service by telephone and the availability of in person appointments. I also discussed with the patient that there may be a patient responsible charge related to this service. The patient expressed understanding and agreed to proceed.   History of Present Illness: I spoke with Terrion today over the phone regarding his low back pain.  He was unable to do a visual virtual and this was via telephone.  He states that his low back pain is intensifying and it has been several month since his last epidural.  These work well for him.  He is starting to get some more left leg and calf cramping and pain which has been more severe.  He is taking his medications as prescribed and these are working well for him and he has had no breakthrough problems with his medications or side effects associated with it.  Otherwise he is in his usual state of health.  His bowel bladder function has been stable and he is deriving good functional lifestyle improvement with the medications.   Observations/Objective: Current Outpatient Medications:  .  APPLE CIDER VINEGAR PO, Take 480 mg by mouth daily., Disp: , Rfl:  .  aspirin 81 MG tablet, Take 162 mg by mouth daily., Disp: , Rfl:  .  atenolol (TENORMIN) 100 MG tablet, Take 50 mg by mouth 2 (two) times daily., Disp: , Rfl:  .  diltiazem (DILTIAZEM CD) 240 MG 24 hr capsule, Take 240 mg by mouth daily., Disp: , Rfl:  .  glipiZIDE (GLUCOTROL) 10 MG tablet, Take 10 mg 2 (two) times daily by mouth., Disp: , Rfl: 3 .  glucosamine-chondroitin 500-400 MG tablet, Take 1 tablet by mouth daily., Disp: , Rfl:  .  HYDROcodone-acetaminophen  (NORCO/VICODIN) 5-325 MG tablet, Take 1-2 tablets by mouth every 6 (six) hours as needed for moderate pain. (Patient not taking: Reported on 09/07/2018), Disp: 20 tablet, Rfl: 0 .  lisinopril (PRINIVIL,ZESTRIL) 20 MG tablet, Take 20 mg by mouth daily., Disp: , Rfl:  .  Melatonin 10 MG TABS, Take by mouth at bedtime as needed., Disp: , Rfl:  .  metFORMIN (GLUCOPHAGE) 500 MG tablet, Take 500 mg by mouth daily with breakfast. Reported on 02/10/2016, Disp: , Rfl:  .  Multiple Vitamins-Minerals (VITAMINS TO GO MEN PO), Take 1 tablet by mouth daily. , Disp: , Rfl:  .  nitroGLYCERIN (NITROSTAT) 0.4 MG SL tablet, Place 1 tablet under tongue as needed for chest pain (may repeat every 5 minutes but seek medical help if pain persists after 3 tablets) as needed, Disp: , Rfl:  .  Omega-3 Fatty Acids (FISH OIL) 1000 MG CAPS, Take by mouth daily., Disp: , Rfl:  .  [START ON 01/13/2019] oxyCODONE ER (XTAMPZA ER) 36 MG C12A, Take 2 capsules (72 mg total) by mouth 2 (two) times daily for 30 days., Disp: 120 capsule, Rfl: 0 .  [START ON 02/12/2019] oxyCODONE ER (XTAMPZA ER) 36 MG C12A, Take 2 capsules (72 mg total) by mouth 2 (two) times daily for 30 days., Disp: 120 capsule, Rfl: 0 .  rosuvastatin (CRESTOR) 10 MG tablet, Take 10 mg by mouth daily., Disp: , Rfl:  .  sertraline (ZOLOFT)  100 MG tablet, Take 200 mg by mouth daily., Disp: , Rfl: 0 .  sertraline (ZOLOFT) 50 MG tablet, Take 100 mg by mouth at bedtime. , Disp: , Rfl:  .  sildenafil (REVATIO) 20 MG tablet, Take 20 mg by mouth as needed (1-5  PO prn)., Disp: , Rfl:  .  tiZANidine (ZANAFLEX) 4 MG tablet, TAKE 1 TABLET (4 MG TOTAL) BY MOUTH 2 (TWO) TIMES DAILY., Disp: 60 tablet, Rfl: 3 .  TraZODone HCl 150 MG TB24, Take by mouth at bedtime., Disp: , Rfl:  .  VITAMIN E PO, Take 90 Units by mouth daily., Disp: , Rfl:    Assessment and Plan: 1. Chronic pain syndrome   2. Long term current use of opiate analgesic   3. Chronic left-sided low back pain with left-sided  sciatica   4. Sciatica of left side associated with disorder of lumbosacral spine   5. Musculoskeletal pain   6. DDD (degenerative disc disease), lumbar   7. Spondylosis of lumbar region without myelopathy or radiculopathy   Based on the findings today and upon review of the [facility] practitioner database information I am going to refill his medication for July 19.  I will schedule him for a 1 month return for an epidural at that time secondary to his left lower leg sciatica and chronic low back pain.  I will have him continue with his current stretching strengthening exercise regimen and he is to continue follow-up with his primary care physician for his baseline medical care.   Follow Up Instructions:    I discussed the assessment and treatment plan with the patient. The patient was provided an opportunity to ask questions and all were answered. The patient agreed with the plan and demonstrated an understanding of the instructions.   The patient was advised to call back or seek an in-person evaluation if the symptoms worsen or if the condition fails to improve as anticipated.  I provided 30 minutes of non-face-to-face time during this encounter.   Molli Barrows, MD

## 2019-02-06 ENCOUNTER — Other Ambulatory Visit: Admission: RE | Admit: 2019-02-06 | Payer: Medicare Other | Source: Ambulatory Visit

## 2019-02-08 ENCOUNTER — Other Ambulatory Visit: Payer: Self-pay

## 2019-02-08 ENCOUNTER — Other Ambulatory Visit
Admission: RE | Admit: 2019-02-08 | Discharge: 2019-02-08 | Disposition: A | Discharge status: Home / Self Care | Payer: Medicare Other | Source: Ambulatory Visit | Attending: Anesthesiology | Admitting: Anesthesiology

## 2019-02-08 DIAGNOSIS — Z1159 Encounter for screening for other viral diseases: Secondary | ICD-10-CM | POA: Diagnosis present

## 2019-02-08 LAB — SARS CORONAVIRUS 2 (TAT 6-24 HRS): SARS Coronavirus 2: NEGATIVE

## 2019-02-09 ENCOUNTER — Encounter: Payer: Self-pay | Admitting: Anesthesiology

## 2019-02-09 ENCOUNTER — Ambulatory Visit
Admission: RE | Admit: 2019-02-09 | Discharge: 2019-02-09 | Disposition: A | Discharge status: Home / Self Care | Payer: Medicare Other | Source: Ambulatory Visit | Attending: Anesthesiology | Admitting: Anesthesiology

## 2019-02-09 ENCOUNTER — Ambulatory Visit (HOSPITAL_BASED_OUTPATIENT_CLINIC_OR_DEPARTMENT_OTHER): Payer: Medicare Other | Admitting: Anesthesiology

## 2019-02-09 ENCOUNTER — Other Ambulatory Visit: Payer: Self-pay | Admitting: Anesthesiology

## 2019-02-09 VITALS — BP 150/103 | HR 68 | Temp 98.0°F | Resp 15 | Ht 69.0 in | Wt 214.0 lb

## 2019-02-09 DIAGNOSIS — Z79891 Long term (current) use of opiate analgesic: Secondary | ICD-10-CM

## 2019-02-09 DIAGNOSIS — M5442 Lumbago with sciatica, left side: Secondary | ICD-10-CM | POA: Diagnosis present

## 2019-02-09 DIAGNOSIS — G8929 Other chronic pain: Secondary | ICD-10-CM | POA: Diagnosis present

## 2019-02-09 DIAGNOSIS — M7918 Myalgia, other site: Secondary | ICD-10-CM

## 2019-02-09 DIAGNOSIS — M47816 Spondylosis without myelopathy or radiculopathy, lumbar region: Secondary | ICD-10-CM

## 2019-02-09 DIAGNOSIS — M5136 Other intervertebral disc degeneration, lumbar region: Secondary | ICD-10-CM

## 2019-02-09 DIAGNOSIS — M4716 Other spondylosis with myelopathy, lumbar region: Secondary | ICD-10-CM | POA: Diagnosis present

## 2019-02-09 DIAGNOSIS — R52 Pain, unspecified: Secondary | ICD-10-CM | POA: Diagnosis present

## 2019-02-09 DIAGNOSIS — M792 Neuralgia and neuritis, unspecified: Secondary | ICD-10-CM | POA: Diagnosis present

## 2019-02-09 DIAGNOSIS — G894 Chronic pain syndrome: Secondary | ICD-10-CM

## 2019-02-09 MED ORDER — IOPAMIDOL (ISOVUE-M 200) INJECTION 41%
20.0000 mL | Freq: Once | INTRAMUSCULAR | Status: DC | PRN
  Administered 2019-02-09: 20 mL
  Filled 2019-02-09: qty 20

## 2019-02-09 MED ORDER — SODIUM CHLORIDE 0.9% FLUSH
10.0000 mL | Freq: Once | INTRAVENOUS | Status: AC
  Administered 2019-02-09: 14:00:00 10 mL

## 2019-02-09 MED ORDER — TRIAMCINOLONE ACETONIDE 40 MG/ML IJ SUSP
40.0000 mg | Freq: Once | INTRAMUSCULAR | Status: AC
  Administered 2019-02-09: 40 mg
  Filled 2019-02-09: qty 1

## 2019-02-09 MED ORDER — XTAMPZA ER 36 MG PO C12A: 2.0000 | EXTENDED_RELEASE_CAPSULE | Freq: Two times a day (BID) | ORAL | 0 refills | Status: AC

## 2019-02-09 MED ORDER — ROPIVACAINE HCL 2 MG/ML IJ SOLN
10.0000 mL | Freq: Once | INTRAMUSCULAR | Status: AC
  Administered 2019-02-09: 14:00:00 1 mL via EPIDURAL
  Filled 2019-02-09: qty 10

## 2019-02-09 MED ORDER — TIZANIDINE HCL 4 MG PO TABS: 4.0000 mg | ORAL_TABLET | Freq: Two times a day (BID) | ORAL | 3 refills | Status: DC

## 2019-02-09 MED ORDER — LIDOCAINE HCL (PF) 1 % IJ SOLN
5.0000 mL | Freq: Once | INTRAMUSCULAR | Status: AC
  Administered 2019-02-09: 5 mL via SUBCUTANEOUS
  Filled 2019-02-09: qty 5

## 2019-02-09 NOTE — Progress Notes (Signed)
Subjective:  Patient ID: Peter Lamb, male    DOB: 10/22/1955  Age: 63 y.o. MRN: [id number]  CC: Back Pain (lower)   Procedure: L5-S1 epidural steroid under fluoroscopic guidance with no sedation  HPI Peter Lamb presents for reevaluation.  He was last seen several months ago and has had recurrence of the same quality characteristic distribution of low back pain and left leg pain that he has had previously.  In the past has had epidural steroid injections to help alleviate this and these helped significantly.  He gets about 75 to 80% relief lasting 6 to 8 weeks before he has gradual recurrence of the same pain.  He is done physical therapy exercises and continue to take his medications as prescribed but despite this he still gets recurrent pain.  No new changes in the lower extremity strength or function or bowel or bladder function are noted.  Based on his current medication regimen this is working well for him.  He is taking these without difficulty and no side effects and reports good functional lifestyle improvement with the medications.  These help him sleep better and manage his pain throughout the day he reports.  Outpatient Medications Prior to Visit  Medication Sig Dispense Refill  . aspirin 81 MG tablet Take 162 mg by mouth daily.    Marland Kitchen atenolol (TENORMIN) 100 MG tablet Take 50 mg by mouth 2 (two) times daily.    Marland Kitchen diltiazem (DILTIAZEM CD) 240 MG 24 hr capsule Take 240 mg by mouth daily.    Marland Kitchen glipiZIDE (GLUCOTROL) 10 MG tablet Take 10 mg 2 (two) times daily by mouth.  3  . glucosamine-chondroitin 500-400 MG tablet Take 1 tablet by mouth daily.    Marland Kitchen lisinopril (PRINIVIL,ZESTRIL) 20 MG tablet Take 20 mg by mouth daily.    . Melatonin 10 MG TABS Take by mouth at bedtime as needed.    . metFORMIN (GLUCOPHAGE) 500 MG tablet Take 500 mg by mouth daily with breakfast. Reported on 02/10/2016    . Multiple Vitamins-Minerals (VITAMINS TO GO MEN PO) Take 1 tablet by mouth daily.     .  nitroGLYCERIN (NITROSTAT) 0.4 MG SL tablet Place 1 tablet under tongue as needed for chest pain (may repeat every 5 minutes but seek medical help if pain persists after 3 tablets) as needed    . Omega-3 Fatty Acids (FISH OIL) 1000 MG CAPS Take by mouth daily.    Derrill Memo ON 02/12/2019] oxyCODONE ER (XTAMPZA ER) 36 MG C12A Take 2 capsules (72 mg total) by mouth 2 (two) times daily for 30 days. 120 capsule 0  . rosuvastatin (CRESTOR) 10 MG tablet Take 10 mg by mouth daily.    . TraZODone HCl 150 MG TB24 Take by mouth at bedtime.    Marland Kitchen VITAMIN E PO Take 90 Units by mouth daily.    Marland Kitchen oxyCODONE ER (XTAMPZA ER) 36 MG C12A Take 2 capsules (72 mg total) by mouth 2 (two) times daily for 30 days. 120 capsule 0  . tiZANidine (ZANAFLEX) 4 MG tablet TAKE 1 TABLET (4 MG TOTAL) BY MOUTH 2 (TWO) TIMES DAILY. 60 tablet 3  . APPLE CIDER VINEGAR PO Take 480 mg by mouth daily.    Marland Kitchen HYDROcodone-acetaminophen (NORCO/VICODIN) 5-325 MG tablet Take 1-2 tablets by mouth every 6 (six) hours as needed for moderate pain. (Patient not taking: Reported on 09/07/2018) 20 tablet 0  . sertraline (ZOLOFT) 100 MG tablet Take 200 mg by mouth daily.  0  .  sertraline (ZOLOFT) 50 MG tablet Take 100 mg by mouth at bedtime.     . sildenafil (REVATIO) 20 MG tablet Take 20 mg by mouth as needed (1-5  PO prn).     No facility-administered medications prior to visit.     Review of Systems CNS: No confusion or sedation Cardiac: No angina or palpitations GI: No abdominal pain or constipation Constitutional: No nausea vomiting fevers or chills  Objective:  BP (!) 150/106   Pulse 68   Temp 98 F (36.7 C) (Oral)   Resp 13   Ht 5\' 9"  (1.753 m)   Wt 214 lb (97.1 kg)   SpO2 95%   BMI 31.60 kg/m    BP Readings from Last 3 Encounters:  02/09/19 (!) 150/106  10/03/18 (!) 147/90  09/07/18 (!) 172/80     Wt Readings from Last 3 Encounters:  02/09/19 214 lb (97.1 kg)  10/03/18 205 lb (93 kg)  09/07/18 205 lb (93 kg)     Physical  Exam Pt is alert and oriented PERRL EOMI HEART IS RRR no murmur or rub LCTA no wheezing or rales MUSCULOSKELETAL reveals some mild paraspinous muscle tenderness in the lumbar region but no overt trigger points.  He walks with a mildly antalgic gait and his strength is at baseline.  Labs  Lab Results  Component Value Date   HGBA1C 9.0 03/04/2010   Lab Results  Component Value Date   CREATININE 0.92 08/21/2016    -------------------------------------------------------------------------------------------------------------------- Lab Results  Component Value Date   WBC 9.2 08/21/2016   HGB 15.3 08/21/2016   HCT 43.6 08/21/2016   PLT 219 08/21/2016   GLUCOSE 132 (H) 08/21/2016   ALT 31 03/04/2010   AST 23 03/04/2010   NA 135 08/21/2016   K 3.7 08/21/2016   CL 100 (L) 08/21/2016   CREATININE 0.92 08/21/2016   BUN 11 08/21/2016   CO2 26 08/21/2016   PSA 0.26 03/04/2010   HGBA1C 9.0 03/04/2010    --------------------------------------------------------------------------------------------------------------------- No results found.   Assessment & Plan:   Peter Lamb was seen today for back pain.  Diagnoses and all orders for this visit:  Chronic pain syndrome  Long term current use of opiate analgesic  Musculoskeletal pain -     tiZANidine (ZANAFLEX) 4 MG tablet; Take 1 tablet (4 mg total) by mouth 2 (two) times daily.  Spondylosis of lumbar region without myelopathy or radiculopathy -     triamcinolone acetonide (KENALOG-40) injection 40 mg -     sodium chloride flush (NS) 0.9 % injection 10 mL -     ropivacaine (PF) 2 mg/mL (0.2%) (NAROPIN) injection 10 mL -     lidocaine (PF) (XYLOCAINE) 1 % injection 5 mL -     iopamidol (ISOVUE-M) 41 % intrathecal injection 20 mL  DDD (degenerative disc disease), lumbar -     triamcinolone acetonide (KENALOG-40) injection 40 mg -     sodium chloride flush (NS) 0.9 % injection 10 mL -     ropivacaine (PF) 2 mg/mL (0.2%) (NAROPIN)  injection 10 mL -     lidocaine (PF) (XYLOCAINE) 1 % injection 5 mL -     iopamidol (ISOVUE-M) 41 % intrathecal injection 20 mL  Chronic left-sided low back pain with left-sided sciatica -     triamcinolone acetonide (KENALOG-40) injection 40 mg -     sodium chloride flush (NS) 0.9 % injection 10 mL -     ropivacaine (PF) 2 mg/mL (0.2%) (NAROPIN) injection 10 mL -  lidocaine (PF) (XYLOCAINE) 1 % injection 5 mL -     iopamidol (ISOVUE-M) 41 % intrathecal injection 20 mL  Lumbar spondylosis with myelopathy  Neurogenic pain  Other orders -     oxyCODONE ER (XTAMPZA ER) 36 MG C12A; Take 2 capsules (72 mg total) by mouth 2 (two) times daily.        ----------------------------------------------------------------------------------------------------------------------  Problem List Items Addressed This Visit      Unprioritized   Chronic low back pain (Chronic)   Relevant Medications   iopamidol (ISOVUE-M) 41 % intrathecal injection 20 mL   tiZANidine (ZANAFLEX) 4 MG tablet   oxyCODONE ER (XTAMPZA ER) 36 MG C12A (Start on 03/14/2019)   Chronic pain - Primary (Chronic)   Relevant Medications   tiZANidine (ZANAFLEX) 4 MG tablet   oxyCODONE ER (XTAMPZA ER) 36 MG C12A (Start on 03/14/2019)   DDD (degenerative disc disease), lumbar   Relevant Medications   iopamidol (ISOVUE-M) 41 % intrathecal injection 20 mL   tiZANidine (ZANAFLEX) 4 MG tablet   oxyCODONE ER (XTAMPZA ER) 36 MG C12A (Start on 03/14/2019)   Long term current use of opiate analgesic (Chronic)   Musculoskeletal pain (Chronic)   Relevant Medications   tiZANidine (ZANAFLEX) 4 MG tablet   Neurogenic pain (Chronic)   Spondylosis of lumbar region without myelopathy or radiculopathy   Relevant Medications   iopamidol (ISOVUE-M) 41 % intrathecal injection 20 mL   tiZANidine (ZANAFLEX) 4 MG tablet   oxyCODONE ER (XTAMPZA ER) 36 MG C12A (Start on 03/14/2019)    Other Visit Diagnoses    Lumbar spondylosis with myelopathy        Relevant Medications   triamcinolone acetonide (KENALOG-40) injection 40 mg (Completed)   tiZANidine (ZANAFLEX) 4 MG tablet   oxyCODONE ER (XTAMPZA ER) 36 MG C12A (Start on 03/14/2019)        ----------------------------------------------------------------------------------------------------------------------  1. Chronic pain syndrome I reviewed the [facility] practitioner database information and it is appropriate.  Refill will be given today for August 18 for his opioids.  He already has a 719 refill.  2. Long term current use of opiate analgesic As above  3. Musculoskeletal pain As above and continue tizanidine.  This was refilled today as well. - tiZANidine (ZANAFLEX) 4 MG tablet; Take 1 tablet (4 mg total) by mouth 2 (two) times daily.  Dispense: 60 tablet; Refill: 3  4. Spondylosis of lumbar region without myelopathy or radiculopathy We will proceed with an epidural at L5-S1 today.  The risks and benefits of been reviewed in full detail all questions answered. - triamcinolone acetonide (KENALOG-40) injection 40 mg - sodium chloride flush (NS) 0.9 % injection 10 mL - ropivacaine (PF) 2 mg/mL (0.2%) (NAROPIN) injection 10 mL - lidocaine (PF) (XYLOCAINE) 1 % injection 5 mL - iopamidol (ISOVUE-M) 41 % intrathecal injection 20 mL  5. DDD (degenerative disc disease), lumbar As above - triamcinolone acetonide (KENALOG-40) injection 40 mg - sodium chloride flush (NS) 0.9 % injection 10 mL - ropivacaine (PF) 2 mg/mL (0.2%) (NAROPIN) injection 10 mL - lidocaine (PF) (XYLOCAINE) 1 % injection 5 mL - iopamidol (ISOVUE-M) 41 % intrathecal injection 20 mL  6. Chronic left-sided low back pain with left-sided sciatica As above and continue core stretching strengthening exercises as tolerated and continue walking for exercise. - triamcinolone acetonide (KENALOG-40) injection 40 mg - sodium chloride flush (NS) 0.9 % injection 10 mL - ropivacaine (PF) 2 mg/mL (0.2%) (NAROPIN)  injection 10 mL - lidocaine (PF) (XYLOCAINE) 1 % injection 5 mL -  iopamidol (ISOVUE-M) 41 % intrathecal injection 20 mL  7. Lumbar spondylosis with myelopathy As above  8. Neurogenic pain As above    ----------------------------------------------------------------------------------------------------------------------  I have changed Peter Lamb's oxyCODONE ER to [facility] ER. I am also having him maintain his aspirin, metFORMIN, sertraline, atenolol, diltiazem, lisinopril, Multiple Vitamins-Minerals (VITAMINS TO GO MEN PO), rosuvastatin, TraZODone HCl, glipiZIDE, nitroGLYCERIN, sertraline, sildenafil, Fish Oil, APPLE CIDER VINEGAR PO, VITAMIN E PO, Melatonin, HYDROcodone-acetaminophen, glucosamine-chondroitin, Xtampza ER, and tiZANidine. We administered triamcinolone acetonide, sodium chloride flush, ropivacaine (PF) 2 mg/mL (0.2%), lidocaine (PF), and iopamidol.   Meds ordered this encounter  Medications  . triamcinolone acetonide (KENALOG-40) injection 40 mg  . sodium chloride flush (NS) 0.9 % injection 10 mL  . ropivacaine (PF) 2 mg/mL (0.2%) (NAROPIN) injection 10 mL  . lidocaine (PF) (XYLOCAINE) 1 % injection 5 mL  . iopamidol (ISOVUE-M) 41 % intrathecal injection 20 mL  . tiZANidine (ZANAFLEX) 4 MG tablet    Sig: Take 1 tablet (4 mg total) by mouth 2 (two) times daily.    Dispense:  60 tablet    Refill:  3  . oxyCODONE ER (XTAMPZA ER) 36 MG C12A    Sig: Take 2 capsules (72 mg total) by mouth 2 (two) times daily.    Dispense:  120 capsule    Refill:  0    30 day supply no early refills   Patient's Medications  New Prescriptions   No medications on file  Previous Medications   APPLE CIDER VINEGAR PO    Take 480 mg by mouth daily.   ASPIRIN 81 MG TABLET    Take 162 mg by mouth daily.   ATENOLOL (TENORMIN) 100 MG TABLET    Take 50 mg by mouth 2 (two) times daily.   DILTIAZEM (DILTIAZEM CD) 240 MG 24 HR CAPSULE    Take 240 mg by mouth daily.   GLIPIZIDE (GLUCOTROL) 10 MG  TABLET    Take 10 mg 2 (two) times daily by mouth.   GLUCOSAMINE-CHONDROITIN 500-400 MG TABLET    Take 1 tablet by mouth daily.   HYDROCODONE-ACETAMINOPHEN (NORCO/VICODIN) 5-325 MG TABLET    Take 1-2 tablets by mouth every 6 (six) hours as needed for moderate pain.   LISINOPRIL (PRINIVIL,ZESTRIL) 20 MG TABLET    Take 20 mg by mouth daily.   MELATONIN 10 MG TABS    Take by mouth at bedtime as needed.   METFORMIN (GLUCOPHAGE) 500 MG TABLET    Take 500 mg by mouth daily with breakfast. Reported on 02/10/2016   MULTIPLE VITAMINS-MINERALS (VITAMINS TO GO MEN PO)    Take 1 tablet by mouth daily.    NITROGLYCERIN (NITROSTAT) 0.4 MG SL TABLET    Place 1 tablet under tongue as needed for chest pain (may repeat every 5 minutes but seek medical help if pain persists after 3 tablets) as needed   OMEGA-3 FATTY ACIDS (FISH OIL) 1000 MG CAPS    Take by mouth daily.   OXYCODONE ER (XTAMPZA ER) 36 MG C12A    Take 2 capsules (72 mg total) by mouth 2 (two) times daily for 30 days.   ROSUVASTATIN (CRESTOR) 10 MG TABLET    Take 10 mg by mouth daily.   SERTRALINE (ZOLOFT) 100 MG TABLET    Take 200 mg by mouth daily.   SERTRALINE (ZOLOFT) 50 MG TABLET    Take 100 mg by mouth at bedtime.    SILDENAFIL (REVATIO) 20 MG TABLET    Take 20 mg by mouth  as needed (1-5  PO prn).   TRAZODONE HCL 150 MG TB24    Take by mouth at bedtime.   VITAMIN E PO    Take 90 Units by mouth daily.  Modified Medications   Modified Medication Previous Medication   OXYCODONE ER (XTAMPZA ER) 36 MG C12A oxyCODONE ER (XTAMPZA ER) 36 MG C12A      Take 2 capsules (72 mg total) by mouth 2 (two) times daily.    Take 2 capsules (72 mg total) by mouth 2 (two) times daily for 30 days.   TIZANIDINE (ZANAFLEX) 4 MG TABLET tiZANidine (ZANAFLEX) 4 MG tablet      Take 1 tablet (4 mg total) by mouth 2 (two) times daily.    TAKE 1 TABLET (4 MG TOTAL) BY MOUTH 2 (TWO) TIMES DAILY.  Discontinued Medications   No medications on file    ----------------------------------------------------------------------------------------------------------------------  Follow-up: Return in about 2 months (around 04/12/2019) for evaluation, med refill.  L5-S1 epidural steroid under fluoroscopic guidance without sedation   Procedure: L5-S1 LESI with fluoroscopic guidance and no moderate sedation  NOTE: The risks, benefits, and expectations of the procedure have been discussed and explained to the patient who was understanding and in agreement with suggested treatment plan. No guarantees were made.  DESCRIPTION OF PROCEDURE: Lumbar epidural steroid injection with no IV Versed, EKG, blood pressure, pulse, and pulse oximetry monitoring. The procedure was performed with the patient in the prone position under fluoroscopic guidance.  Sterile prep x3 was initiated and I then injected subcutaneous lidocaine to the overlying L5-S1 site after its fluoroscopic identifictation.  Using strict aseptic technique, I then advanced an 18-gauge Tuohy epidural needle in the midline using interlaminar approach via loss-of-resistance to saline technique. There was negative aspiration for heme or  CSF.  I then confirmed position with both AP and Lateral fluoroscan.  2 cc of Isovue were injected and a  total of 5 mL of Preservative-Free normal saline mixed with 40 mg of Kenalog and 1cc Ropicaine 0.2 percent were injected incrementally via the  epidurally placed needle. The needle was removed. The patient tolerated the injection well and was convalesced and discharged to home in stable condition. Should the patient have any post procedure difficulty they have been instructed on how to contact us for assistance.    Molli Barrows, MD

## 2019-02-09 NOTE — Patient Instructions (Signed)
Pain Management Discharge Instructions  General Discharge Instructions :  If you need to reach your doctor call: Monday-Friday 8:00 am - 4:00 pm at [phone] or toll free [phone].  After clinic hours [phone] to have operator reach doctor.  Bring all of your medication bottles to all your appointments in the pain clinic.  To cancel or reschedule your appointment with Pain Management please remember to call 24 hours in advance to avoid a fee.  Refer to the educational materials which you have been given on: General Risks, I had my Procedure. Discharge Instructions, Post Sedation.  Post Procedure Instructions:  The drugs you were given will stay in your system until tomorrow, so for the next 24 hours you should not drive, make any legal decisions or drink any alcoholic beverages.  You may eat anything you prefer, but it is better to start with liquids then soups and crackers, and gradually work up to solid foods.  Please notify your doctor immediately if you have any unusual bleeding, trouble breathing or pain that is not related to your normal pain.  Depending on the type of procedure that was done, some parts of your body may feel week and/or numb.  This usually clears up by tonight or the next day.  Walk with the use of an assistive device or accompanied by an adult for the 24 hours.  You may use ice on the affected area for the first 24 hours.  Put ice in a Ziploc bag and cover with a towel and place against area 15 minutes on 15 minutes off.  You may switch to heat after 24 hours.Epidural Steroid Injection Patient Information  Description: The epidural space surrounds the nerves as they exit the spinal cord.  In some patients, the nerves can be compressed and inflamed by a bulging disc or a tight spinal canal (spinal stenosis).  By injecting steroids into the epidural space, we can bring irritated nerves into direct contact with a potentially helpful medication.  These  steroids act directly on the irritated nerves and can reduce swelling and inflammation which often leads to decreased pain.  Epidural steroids may be injected anywhere along the spine and from the neck to the low back depending upon the location of your pain.   After numbing the skin with local anesthetic (like Novocaine), a small needle is passed into the epidural space slowly.  You may experience a sensation of pressure while this is being done.  The entire block usually last less than 10 minutes.  Conditions which may be treated by epidural steroids:   Low back and leg pain  Neck and arm pain  Spinal stenosis  Post-laminectomy syndrome  Herpes zoster (shingles) pain  Pain from compression fractures  Preparation for the injection:  1. Do not eat any solid food or dairy products within 8 hours of your appointment.  2. You may drink clear liquids up to 3 hours before appointment.  Clear liquids include water, black coffee, juice or soda.  No milk or cream please. 3. You may take your regular medication, including pain medications, with a sip of water before your appointment  Diabetics should hold regular insulin (if taken separately) and take 1/2 normal NPH dos the morning of the procedure.  Carry some sugar containing items with you to your appointment. 4. A driver must accompany you and be prepared to drive you home after your procedure.  5. Bring all your current medications with your. 6. An IV may be inserted and   sedation may be given at the discretion of the physician.   7. A blood pressure cuff, EKG and other monitors will often be applied during the procedure.  Some patients may need to have extra oxygen administered for a short period. 8. You will be asked to provide medical information, including your allergies, prior to the procedure.  We must know immediately if you are taking blood thinners (like Coumadin/Warfarin)  Or if you are allergic to IV iodine contrast (dye). We must  know if you could possible be pregnant.  Possible side-effects:  Bleeding from needle site  Infection (rare, may require surgery)  Nerve injury (rare)  Numbness & tingling (temporary)  Difficulty urinating (rare, temporary)  Spinal headache ( a headache worse with upright posture)  Light -headedness (temporary)  Pain at injection site (several days)  Decreased blood pressure (temporary)  Weakness in arm/leg (temporary)  Pressure sensation in back/neck (temporary)  Call if you experience:  Fever/chills associated with headache or increased back/neck pain.  Headache worsened by an upright position.  New onset weakness or numbness of an extremity below the injection site  Hives or difficulty breathing (go to the emergency room)  Inflammation or drainage at the infection site  Severe back/neck pain  Any new symptoms which are concerning to you  Please note:  Although the local anesthetic injected can often make your back or neck feel good for several hours after the injection, the pain will likely return.  It takes 3-7 days for steroids to work in the epidural space.  You may not notice any pain relief for at least that one week.  If effective, we will often do a series of three injections spaced 3-6 weeks apart to maximally decrease your pain.  After the initial series, we generally will wait several months before considering a repeat injection of the same type.  If you have any questions, please call [phone] [facility] 

## 2019-02-09 NOTE — Progress Notes (Signed)
Safety precautions to be maintained throughout the outpatient stay will include: orient to surroundings, keep bed in low position, maintain call bell within reach at all times, provide assistance with transfer out of bed and ambulation.  

## 2019-02-10 ENCOUNTER — Telehealth: Payer: Self-pay | Admitting: *Deleted

## 2019-03-14 ENCOUNTER — Telehealth: Payer: Self-pay | Admitting: Anesthesiology

## 2019-03-14 MED ORDER — XTAMPZA ER 36 MG PO C12A: 2.0000 | EXTENDED_RELEASE_CAPSULE | Freq: Two times a day (BID) | ORAL | 0 refills | Status: AC

## 2019-03-14 NOTE — Addendum Note (Signed)
Addended by: Molli Barrows on: 03/14/2019 03:47 PM   Modules accepted: Orders

## 2019-03-14 NOTE — Telephone Encounter (Signed)
Talked with patient to inform him Dr Andree Elk is not in the office today and that I would send him a message about his medication and if it could be transferred. [facility] states that [facility] in [place] has the medication and demanded that it be handled today. Patient got real verbal with me and told him that he could change pharm if he was not happy with them.

## 2019-03-14 NOTE — Telephone Encounter (Signed)
Can you transfer [facility] [place]. Thanks.

## 2019-03-14 NOTE — Telephone Encounter (Signed)
[facility] called stating [facility] [address] does not [facility] the [place] and doesn't know when they can get it. They told him to call us and cancel this script and Escript to [facility] in [place].

## 2019-04-11 ENCOUNTER — Ambulatory Visit: Payer: Medicare Other | Admitting: Anesthesiology

## 2019-05-10 ENCOUNTER — Ambulatory Visit: Payer: Medicare Other | Attending: Anesthesiology | Admitting: Anesthesiology

## 2019-05-10 ENCOUNTER — Other Ambulatory Visit: Payer: Self-pay

## 2019-05-10 ENCOUNTER — Encounter: Payer: Self-pay | Admitting: Anesthesiology

## 2019-05-10 DIAGNOSIS — M47816 Spondylosis without myelopathy or radiculopathy, lumbar region: Secondary | ICD-10-CM

## 2019-05-10 DIAGNOSIS — M5442 Lumbago with sciatica, left side: Secondary | ICD-10-CM | POA: Diagnosis not present

## 2019-05-10 DIAGNOSIS — Z79891 Long term (current) use of opiate analgesic: Secondary | ICD-10-CM

## 2019-05-10 DIAGNOSIS — M51369 Other intervertebral disc degeneration, lumbar region without mention of lumbar back pain or lower extremity pain: Secondary | ICD-10-CM

## 2019-05-10 DIAGNOSIS — G8929 Other chronic pain: Secondary | ICD-10-CM

## 2019-05-10 DIAGNOSIS — G894 Chronic pain syndrome: Secondary | ICD-10-CM | POA: Diagnosis not present

## 2019-05-10 DIAGNOSIS — M5136 Other intervertebral disc degeneration, lumbar region: Secondary | ICD-10-CM

## 2019-05-10 DIAGNOSIS — M7918 Myalgia, other site: Secondary | ICD-10-CM | POA: Diagnosis not present

## 2019-05-10 DIAGNOSIS — M792 Neuralgia and neuritis, unspecified: Secondary | ICD-10-CM

## 2019-05-10 MED ORDER — XTAMPZA ER 36 MG PO C12A: 2.0000 | EXTENDED_RELEASE_CAPSULE | Freq: Two times a day (BID) | ORAL | 0 refills | Status: DC

## 2019-05-10 MED ORDER — XTAMPZA ER 36 MG PO C12A: 2.0000 | EXTENDED_RELEASE_CAPSULE | Freq: Two times a day (BID) | ORAL | 0 refills | Status: AC

## 2019-05-10 NOTE — Progress Notes (Signed)
Virtual Visit via Telephone Note  I connected with Peter Lamb on 05/10/19 at  2:00 PM EDT by telephone and verified that I am speaking with the correct person using two identifiers.  Location: Patient: Home Provider: Pain control center   I discussed the limitations, risks, security and privacy concerns of performing an evaluation and management service by telephone and the availability of in person appointments. I also discussed with the patient that there may be a patient responsible charge related to this service. The patient expressed understanding and agreed to proceed.   History of Present Illness: I spoke with Peter Lamb via teleconferencing for his chronic low back pain and leg pain.  He states that he with his last epidural getting approximately 75% relief but he is getting some recurrence.  He has his epidurals periodically to keep his pain under good control.  That in addition to his chronic opioid therapy this regimen keeps his pain within reason.  With the medications he is sleeping better and able to be more functional and denies any side effects with the opioids.  No diverting or illicit use is reported.  Otherwise she is in his usual state of health but has had some gradual recurrence of the radiating pain from the low back into the left posterior calf and buttocks.  His last epidural gave him approximately 75 to 80% relief he reports.  Otherwise no change in bowel or bladder function is noted.    Observations/Objective:  Current Outpatient Medications:  .  APPLE CIDER VINEGAR PO, Take 480 mg by mouth daily., Disp: , Rfl:  .  aspirin 81 MG tablet, Take 162 mg by mouth daily., Disp: , Rfl:  .  atenolol (TENORMIN) 100 MG tablet, Take 50 mg by mouth 2 (two) times daily., Disp: , Rfl:  .  diltiazem (DILTIAZEM CD) 240 MG 24 hr capsule, Take 240 mg by mouth daily., Disp: , Rfl:  .  glipiZIDE (GLUCOTROL) 10 MG tablet, Take 10 mg 2 (two) times daily by mouth., Disp: , Rfl: 3 .   glucosamine-chondroitin 500-400 MG tablet, Take 1 tablet by mouth daily., Disp: , Rfl:  .  HYDROcodone-acetaminophen (NORCO/VICODIN) 5-325 MG tablet, Take 1-2 tablets by mouth every 6 (six) hours as needed for moderate pain. (Patient not taking: Reported on 09/07/2018), Disp: 20 tablet, Rfl: 0 .  lisinopril (PRINIVIL,ZESTRIL) 20 MG tablet, Take 20 mg by mouth daily., Disp: , Rfl:  .  Melatonin 10 MG TABS, Take by mouth at bedtime as needed., Disp: , Rfl:  .  metFORMIN (GLUCOPHAGE) 500 MG tablet, Take 500 mg by mouth daily with breakfast. Reported on 02/10/2016, Disp: , Rfl:  .  Multiple Vitamins-Minerals (VITAMINS TO GO MEN PO), Take 1 tablet by mouth daily. , Disp: , Rfl:  .  nitroGLYCERIN (NITROSTAT) 0.4 MG SL tablet, Place 1 tablet under tongue as needed for chest pain (may repeat every 5 minutes but seek medical help if pain persists after 3 tablets) as needed, Disp: , Rfl:  .  Omega-3 Fatty Acids (FISH OIL) 1000 MG CAPS, Take by mouth daily., Disp: , Rfl:  .  [START ON 05/13/2019] oxyCODONE ER (XTAMPZA ER) 36 MG C12A, Take 2 capsules (72 mg total) by mouth 2 (two) times daily., Disp: 120 capsule, Rfl: 0 .  [START ON 06/12/2019] oxyCODONE ER (XTAMPZA ER) 36 MG C12A, Take 2 capsules (72 mg total) by mouth 2 (two) times daily., Disp: 120 capsule, Rfl: 0 .  rosuvastatin (CRESTOR) 10 MG tablet, Take 10  mg by mouth daily., Disp: , Rfl:  .  sertraline (ZOLOFT) 100 MG tablet, Take 200 mg by mouth daily., Disp: , Rfl: 0 .  sertraline (ZOLOFT) 50 MG tablet, Take 100 mg by mouth at bedtime. , Disp: , Rfl:  .  sildenafil (REVATIO) 20 MG tablet, Take 20 mg by mouth as needed (1-5  PO prn)., Disp: , Rfl:  .  tiZANidine (ZANAFLEX) 4 MG tablet, Take 1 tablet (4 mg total) by mouth 2 (two) times daily., Disp: 60 tablet, Rfl: 3 .  TraZODone HCl 150 MG TB24, Take by mouth at bedtime., Disp: , Rfl:  .  VITAMIN E PO, Take 90 Units by mouth daily., Disp: , Rfl:    Assessment and Plan: 1. Chronic pain syndrome   2.  Long term current use of opiate analgesic   3. Musculoskeletal pain   4. Chronic left-sided low back pain with left-sided sciatica   5. DDD (degenerative disc disease), lumbar   6. Spondylosis of lumbar region without myelopathy or radiculopathy   7. Neurogenic pain   8. Facet arthritis of lumbar region   Based on our discussion today I am going to renew his medications for the next 2 months dated October 17 and November 16.  This is following review of the [facility] practitioner database information.  He has been compliant with this regimen and it appears to continue to work well for him.  I am also scheduling him for a 24-month return to clinic and an epidural at that time secondary to the chronicity and recurrent nature of his sciatica left side.  In the meantime I want him to continue follow-up with his primary care physicians for his baseline medical care.   Follow Up Instructions:    I discussed the assessment and treatment plan with the patient. The patient was provided an opportunity to ask questions and all were answered. The patient agreed with the plan and demonstrated an understanding of the instructions.   The patient was advised to call back or seek an in-person evaluation if the symptoms worsen or if the condition fails to improve as anticipated.  I provided 30 minutes of non-face-to-face time during this encounter.   Molli Barrows, MD

## 2019-06-15 ENCOUNTER — Telehealth: Payer: Self-pay | Admitting: Anesthesiology

## 2019-06-15 NOTE — Telephone Encounter (Signed)
Patient needs refill on Tizanidine. He is scheduled for med mgmt on 07-03-19 virtual w/ Dr. Andree Elk. He forgot to get refill in Oct. Appt.  Please let him know status

## 2019-06-15 NOTE — Telephone Encounter (Signed)
Done. Called into pharmacy 06/15/19 at 1500.

## 2019-07-03 ENCOUNTER — Ambulatory Visit: Payer: Medicare Other | Attending: Anesthesiology | Admitting: Anesthesiology

## 2019-07-03 ENCOUNTER — Other Ambulatory Visit: Payer: Self-pay

## 2019-07-03 DIAGNOSIS — M5136 Other intervertebral disc degeneration, lumbar region: Secondary | ICD-10-CM

## 2019-07-03 DIAGNOSIS — Z79891 Long term (current) use of opiate analgesic: Secondary | ICD-10-CM

## 2019-07-03 DIAGNOSIS — M47816 Spondylosis without myelopathy or radiculopathy, lumbar region: Secondary | ICD-10-CM

## 2019-07-03 DIAGNOSIS — G894 Chronic pain syndrome: Secondary | ICD-10-CM

## 2019-07-03 DIAGNOSIS — M7918 Myalgia, other site: Secondary | ICD-10-CM

## 2019-07-03 DIAGNOSIS — M5442 Lumbago with sciatica, left side: Secondary | ICD-10-CM | POA: Diagnosis not present

## 2019-07-03 DIAGNOSIS — G8929 Other chronic pain: Secondary | ICD-10-CM

## 2019-07-03 DIAGNOSIS — M792 Neuralgia and neuritis, unspecified: Secondary | ICD-10-CM

## 2019-07-03 MED ORDER — XTAMPZA ER 36 MG PO C12A: 2.0000 | EXTENDED_RELEASE_CAPSULE | Freq: Two times a day (BID) | ORAL | 0 refills | Status: AC

## 2019-07-03 MED ORDER — XTAMPZA ER 36 MG PO C12A: 2.0000 | EXTENDED_RELEASE_CAPSULE | Freq: Two times a day (BID) | ORAL | 0 refills | Status: DC

## 2019-07-03 NOTE — Progress Notes (Signed)
Virtual Visit via Video Note  I connected with Peter Lamb on 07/03/19 at  1:30 PM EST by a video enabled telemedicine application and verified that I am speaking with the correct person using two identifiers.  Location: Patient: Home Provider: Pain center   I discussed the limitations of evaluation and management by telemedicine and the availability of in person appointments. The patient expressed understanding and agreed to proceed.  History of Present Illness: I spoke with Peter Lamb via Risk analyst for his low back pain.  The quality characteristic distribution of the back pain is been similar.  His last epidural was back in July and he is getting some wind up he reports.  This is the same pain that he is chronically experiencing low back with radiation into the left greater than right posterior lateral leg.  No new changes in bowel or bladder function or lower extremity strength or function are noted.  He still taking his medications as prescribed with 2 of his tablets twice a day and these work well with no side effects reported.  These enable him to function during the day with good relief and sleep at night.  Otherwise he is in his usual state of health.  Bowel bladder function of been stable he reports.  He has tried to do his stretching strengthening exercises with limited success.    Observations/Objective: Current Outpatient Medications:  .  APPLE CIDER VINEGAR PO, Take 480 mg by mouth daily., Disp: , Rfl:  .  aspirin 81 MG tablet, Take 162 mg by mouth daily., Disp: , Rfl:  .  atenolol (TENORMIN) 100 MG tablet, Take 50 mg by mouth 2 (two) times daily., Disp: , Rfl:  .  diltiazem (DILTIAZEM CD) 240 MG 24 hr capsule, Take 240 mg by mouth daily., Disp: , Rfl:  .  glipiZIDE (GLUCOTROL) 10 MG tablet, Take 10 mg 2 (two) times daily by mouth., Disp: , Rfl: 3 .  glucosamine-chondroitin 500-400 MG tablet, Take 1 tablet by mouth daily., Disp: , Rfl:  .   HYDROcodone-acetaminophen (NORCO/VICODIN) 5-325 MG tablet, Take 1-2 tablets by mouth every 6 (six) hours as needed for moderate pain. (Patient not taking: Reported on 09/07/2018), Disp: 20 tablet, Rfl: 0 .  lisinopril (PRINIVIL,ZESTRIL) 20 MG tablet, Take 20 mg by mouth daily., Disp: , Rfl:  .  Melatonin 10 MG TABS, Take by mouth at bedtime as needed., Disp: , Rfl:  .  metFORMIN (GLUCOPHAGE) 500 MG tablet, Take 500 mg by mouth daily with breakfast. Reported on 02/10/2016, Disp: , Rfl:  .  Multiple Vitamins-Minerals (VITAMINS TO GO MEN PO), Take 1 tablet by mouth daily. , Disp: , Rfl:  .  nitroGLYCERIN (NITROSTAT) 0.4 MG SL tablet, Place 1 tablet under tongue as needed for chest pain (may repeat every 5 minutes but seek medical help if pain persists after 3 tablets) as needed, Disp: , Rfl:  .  Omega-3 Fatty Acids (FISH OIL) 1000 MG CAPS, Take by mouth daily., Disp: , Rfl:  .  oxyCODONE ER (XTAMPZA ER) 36 MG C12A, Take 2 capsules (72 mg total) by mouth 2 (two) times daily., Disp: 120 capsule, Rfl: 0 .  [START ON 07/12/2019] oxyCODONE ER (XTAMPZA ER) 36 MG C12A, Take 2 capsules (72 mg total) by mouth 2 (two) times daily., Disp: 120 capsule, Rfl: 0 .  [START ON 08/11/2019] oxyCODONE ER (XTAMPZA ER) 36 MG C12A, Take 2 capsules (72 mg total) by mouth 2 (two) times daily., Disp: 120 capsule, Rfl: 0 .  rosuvastatin (CRESTOR) 10 MG tablet, Take 10 mg by mouth daily., Disp: , Rfl:  .  sertraline (ZOLOFT) 100 MG tablet, Take 200 mg by mouth daily., Disp: , Rfl: 0 .  sertraline (ZOLOFT) 50 MG tablet, Take 100 mg by mouth at bedtime. , Disp: , Rfl:  .  sildenafil (REVATIO) 20 MG tablet, Take 20 mg by mouth as needed (1-5  PO prn)., Disp: , Rfl:  .  tiZANidine (ZANAFLEX) 4 MG tablet, Take 1 tablet (4 mg total) by mouth 2 (two) times daily., Disp: 60 tablet, Rfl: 3 .  TraZODone HCl 150 MG TB24, Take by mouth at bedtime., Disp: , Rfl:  .  VITAMIN E PO, Take 90 Units by mouth daily., Disp: , Rfl:    Assessment and  Plan: 1. Chronic pain syndrome   2. Long term current use of opiate analgesic   3. Musculoskeletal pain   4. Chronic left-sided low back pain with left-sided sciatica   5. DDD (degenerative disc disease), lumbar   6. Spondylosis of lumbar region without myelopathy or radiculopathy   7. Facet arthritis of lumbar region   8. Neurogenic pain   Based on our discussion today and upon review of the [facility] practitioner database information I am going to refill his medication for the next 2 months.  This will be dated for December 16 and January 15.  I am scheduling him for return to clinic in 2 months and an epidural at that point.  Otherwise I want him to continue with back stretching strengthening exercises and aerobic conditioning with walking and stay active during the Covid crisis.  He is to continue follow-up with his primary care physicians for his baseline medical care.  Follow Up Instructions:    I discussed the assessment and treatment plan with the patient. The patient was provided an opportunity to ask questions and all were answered. The patient agreed with the plan and demonstrated an understanding of the instructions.   The patient was advised to call back or seek an in-person evaluation if the symptoms worsen or if the condition fails to improve as anticipated.  I provided 30 minutes of non-face-to-face time during this encounter.   Molli Barrows, MD

## 2019-09-04 ENCOUNTER — Telehealth: Payer: Self-pay

## 2019-09-04 ENCOUNTER — Ambulatory Visit: Payer: Self-pay | Admitting: Urology

## 2019-09-04 NOTE — Telephone Encounter (Signed)
Patient called saying someone called him. After investigating I concluded that it was the automated phone reminding him of his appt on Thursday.

## 2019-09-07 ENCOUNTER — Ambulatory Visit
Admission: RE | Admit: 2019-09-07 | Discharge: 2019-09-07 | Disposition: A | Discharge status: Home / Self Care | Payer: Medicare Other | Source: Ambulatory Visit | Attending: Anesthesiology | Admitting: Anesthesiology

## 2019-09-07 ENCOUNTER — Ambulatory Visit (HOSPITAL_BASED_OUTPATIENT_CLINIC_OR_DEPARTMENT_OTHER): Payer: Medicare Other | Admitting: Anesthesiology

## 2019-09-07 ENCOUNTER — Encounter: Payer: Self-pay | Admitting: Anesthesiology

## 2019-09-07 ENCOUNTER — Other Ambulatory Visit: Payer: Self-pay | Admitting: Anesthesiology

## 2019-09-07 ENCOUNTER — Other Ambulatory Visit: Payer: Self-pay

## 2019-09-07 VITALS — BP 147/90 | HR 60 | Temp 97.2°F | Resp 18 | Ht 70.0 in | Wt 214.0 lb

## 2019-09-07 DIAGNOSIS — M7918 Myalgia, other site: Secondary | ICD-10-CM | POA: Diagnosis present

## 2019-09-07 DIAGNOSIS — M47816 Spondylosis without myelopathy or radiculopathy, lumbar region: Secondary | ICD-10-CM | POA: Diagnosis present

## 2019-09-07 DIAGNOSIS — M792 Neuralgia and neuritis, unspecified: Secondary | ICD-10-CM | POA: Insufficient documentation

## 2019-09-07 DIAGNOSIS — M5387 Other specified dorsopathies, lumbosacral region: Secondary | ICD-10-CM

## 2019-09-07 DIAGNOSIS — Z79891 Long term (current) use of opiate analgesic: Secondary | ICD-10-CM | POA: Diagnosis present

## 2019-09-07 DIAGNOSIS — G894 Chronic pain syndrome: Secondary | ICD-10-CM | POA: Diagnosis present

## 2019-09-07 DIAGNOSIS — G8929 Other chronic pain: Secondary | ICD-10-CM | POA: Insufficient documentation

## 2019-09-07 DIAGNOSIS — M5136 Other intervertebral disc degeneration, lumbar region: Secondary | ICD-10-CM

## 2019-09-07 DIAGNOSIS — R52 Pain, unspecified: Secondary | ICD-10-CM

## 2019-09-07 DIAGNOSIS — M5442 Lumbago with sciatica, left side: Secondary | ICD-10-CM

## 2019-09-07 MED ORDER — LIDOCAINE HCL (PF) 1 % IJ SOLN
5.0000 mL | Freq: Once | INTRAMUSCULAR | Status: AC
  Administered 2019-09-07: 5 mL via SUBCUTANEOUS
  Filled 2019-09-07: qty 5

## 2019-09-07 MED ORDER — ROPIVACAINE HCL 2 MG/ML IJ SOLN
10.0000 mL | Freq: Once | INTRAMUSCULAR | Status: AC
  Administered 2019-09-07: 10 mL via EPIDURAL
  Filled 2019-09-07: qty 10

## 2019-09-07 MED ORDER — IOPAMIDOL (ISOVUE-M 200) INJECTION 41%
20.0000 mL | Freq: Once | INTRAMUSCULAR | Status: DC | PRN
  Administered 2019-09-07: 20 mL

## 2019-09-07 MED ORDER — TRIAMCINOLONE ACETONIDE 40 MG/ML IJ SUSP
40.0000 mg | Freq: Once | INTRAMUSCULAR | Status: AC
  Administered 2019-09-07: 14:00:00 40 mg
  Filled 2019-09-07: qty 1

## 2019-09-07 MED ORDER — TIZANIDINE HCL 4 MG PO TABS: 4.0000 mg | ORAL_TABLET | Freq: Two times a day (BID) | ORAL | 4 refills | Status: DC

## 2019-09-07 MED ORDER — XTAMPZA ER 36 MG PO C12A: 2.0000 | EXTENDED_RELEASE_CAPSULE | Freq: Two times a day (BID) | ORAL | 0 refills | Status: DC

## 2019-09-07 MED ORDER — SODIUM CHLORIDE 0.9% FLUSH
10.0000 mL | Freq: Once | INTRAVENOUS | Status: AC
  Administered 2019-09-07: 10 mL

## 2019-09-07 MED ORDER — XTAMPZA ER 36 MG PO C12A: 2.0000 | EXTENDED_RELEASE_CAPSULE | Freq: Two times a day (BID) | ORAL | 0 refills | Status: AC

## 2019-09-07 MED ORDER — SODIUM CHLORIDE (PF) 0.9 % IJ SOLN
INTRAMUSCULAR | Status: AC
  Filled 2019-09-07: qty 10

## 2019-09-07 MED ORDER — IOHEXOL 180 MG/ML  SOLN
INTRAMUSCULAR | Status: AC
  Filled 2019-09-07: qty 20

## 2019-09-07 NOTE — Progress Notes (Signed)
Subjective:  Patient ID: Peter Lamb, male    DOB: Jan 14, 1956  Age: 64 y.o. MRN: [id number]  CC: Back Pain (lumbar left is worse )   Procedure: L5-S1 epidural steroid under fluoroscopic guidance with no sedation  HPI Peter Lamb presents for reevaluation.  Was last seen a few months ago and last had an epidural back in July of last year.  He is having considerably more left posterior lateral leg pain similar to what he is experienced in the past and desires to proceed with a repeat epidural today.  The quality characteristic and distribution of the pain are otherwise unchanged but more intense as of recent.  He generally gets 75 to 80% pain relief following the epidural injection lasting approximately 2 months before he gets gradual recurrence of the same quality pain.  No new changes in lower extremity strength or function or bowel or bladder function or noted.  He is also getting good relief with his medications.  No side effects are reported and he is due for his tizanidine.  Outpatient Medications Prior to Visit  Medication Sig Dispense Refill  . aspirin 81 MG tablet Take 162 mg by mouth daily.    Marland Kitchen atenolol (TENORMIN) 100 MG tablet Take 50 mg by mouth 2 (two) times daily.    Marland Kitchen diltiazem (DILTIAZEM CD) 240 MG 24 hr capsule Take 240 mg by mouth daily.    Marland Kitchen glipiZIDE (GLUCOTROL) 10 MG tablet Take 10 mg 2 (two) times daily by mouth.  3  . glucosamine-chondroitin 500-400 MG tablet Take 1 tablet by mouth daily.    Marland Kitchen lisinopril (PRINIVIL,ZESTRIL) 20 MG tablet Take 20 mg by mouth daily.    . Melatonin 10 MG TABS Take by mouth at bedtime as needed.    . metFORMIN (GLUCOPHAGE) 500 MG tablet Take 500 mg by mouth daily with breakfast. Reported on 02/10/2016    . Multiple Vitamins-Minerals (VITAMINS TO GO MEN PO) Take 1 tablet by mouth daily.     . nitroGLYCERIN (NITROSTAT) 0.4 MG SL tablet Place 1 tablet under tongue as needed for chest pain (may repeat every 5 minutes but seek medical help  if pain persists after 3 tablets) as needed    . Omega-3 Fatty Acids (FISH OIL) 1000 MG CAPS Take by mouth daily.    . rosuvastatin (CRESTOR) 10 MG tablet Take 10 mg by mouth daily.    . tadalafil (CIALIS) 20 MG tablet Take 20 mg by mouth daily as needed.    . TraZODone HCl 150 MG TB24 Take by mouth at bedtime.    Marland Kitchen VITAMIN E PO Take 90 Units by mouth daily.    . APPLE CIDER VINEGAR PO Take 480 mg by mouth daily.    Marland Kitchen HYDROcodone-acetaminophen (NORCO/VICODIN) 5-325 MG tablet Take 1-2 tablets by mouth every 6 (six) hours as needed for moderate pain. (Patient not taking: Reported on 09/07/2018) 20 tablet 0  . sertraline (ZOLOFT) 100 MG tablet Take 200 mg by mouth daily.  0  . sertraline (ZOLOFT) 50 MG tablet Take 100 mg by mouth at bedtime.     . sildenafil (REVATIO) 20 MG tablet Take 20 mg by mouth as needed (1-5  PO prn).    Marland Kitchen tiZANidine (ZANAFLEX) 4 MG tablet Take 1 tablet (4 mg total) by mouth 2 (two) times daily. 60 tablet 3  . oxyCODONE ER (XTAMPZA ER) 36 MG C12A Take 2 capsules (72 mg total) by mouth 2 (two) times daily. 120 capsule 0  No facility-administered medications prior to visit.    Review of Systems CNS: No confusion or sedation Cardiac: No angina or palpitations GI: No abdominal pain or constipation Constitutional: No nausea vomiting fevers or chills  Objective:  BP (!) 147/90   Pulse 60   Temp (!) 97.2 F (36.2 C) (Temporal)   Resp 18   Ht 5\' 10"  (1.778 m)   Wt 214 lb (97.1 kg)   SpO2 96%   BMI 30.71 kg/m    BP Readings from Last 3 Encounters:  09/07/19 (!) 147/90  02/09/19 (!) 150/103  10/03/18 (!) 147/90     Wt Readings from Last 3 Encounters:  09/07/19 214 lb (97.1 kg)  02/09/19 214 lb (97.1 kg)  10/03/18 205 lb (93 kg)     Physical Exam Pt is alert and oriented PERRL EOMI HEART IS RRR no murmur or rub LCTA no wheezing or rales MUSCULOSKELETAL reveals some paraspinous muscle tenderness but no overt trigger points.  He has return of a positive  straight leg raise on the left side.  Also tone and bulk is at baseline  Labs  Lab Results  Component Value Date   HGBA1C 9.0 03/04/2010   Lab Results  Component Value Date   CREATININE 0.92 08/21/2016    -------------------------------------------------------------------------------------------------------------------- Lab Results  Component Value Date   WBC 9.2 08/21/2016   HGB 15.3 08/21/2016   HCT 43.6 08/21/2016   PLT 219 08/21/2016   GLUCOSE 132 (H) 08/21/2016   ALT 31 03/04/2010   AST 23 03/04/2010   NA 135 08/21/2016   K 3.7 08/21/2016   CL 100 (L) 08/21/2016   CREATININE 0.92 08/21/2016   BUN 11 08/21/2016   CO2 26 08/21/2016   PSA 0.26 03/04/2010   HGBA1C 9.0 03/04/2010    --------------------------------------------------------------------------------------------------------------------- No results found.   Assessment & Plan:   Peter Lamb was seen today for back pain.  Diagnoses and all orders for this visit:  Chronic pain syndrome  Long term current use of opiate analgesic  Musculoskeletal pain  Chronic left-sided low back pain with left-sided sciatica  DDD (degenerative disc disease), lumbar  Spondylosis of lumbar region without myelopathy or radiculopathy  Facet arthritis of lumbar region  Neurogenic pain  Sciatica of left side associated with disorder of lumbosacral spine  Other orders -     triamcinolone acetonide (KENALOG-40) injection 40 mg -     sodium chloride flush (NS) 0.9 % injection 10 mL -     ropivacaine (PF) 2 mg/mL (0.2%) (NAROPIN) injection 10 mL -     lidocaine (PF) (XYLOCAINE) 1 % injection 5 mL -     iopamidol (ISOVUE-M) 41 % intrathecal injection 20 mL -     oxyCODONE ER (XTAMPZA ER) 36 MG C12A; Take 2 capsules (72 mg total) by mouth 2 (two) times daily. -     oxyCODONE ER (XTAMPZA ER) 36 MG C12A; Take 2 capsules (72 mg total) by mouth 2 (two) times daily. -     tiZANidine (ZANAFLEX) 4 MG tablet; Take 1 tablet (4 mg  total) by mouth 2 (two) times daily.        ----------------------------------------------------------------------------------------------------------------------  Problem List Items Addressed This Visit      Unprioritized   Chronic low back pain (Chronic)   Relevant Medications   oxyCODONE ER (XTAMPZA ER) 36 MG C12A (Start on 09/10/2019)   oxyCODONE ER (XTAMPZA ER) 36 MG C12A (Start on 10/09/2019)   tiZANidine (ZANAFLEX) 4 MG tablet   Chronic pain - Primary (Chronic)  Relevant Medications   oxyCODONE ER (XTAMPZA ER) 36 MG C12A (Start on 09/10/2019)   oxyCODONE ER (XTAMPZA ER) 36 MG C12A (Start on 10/09/2019)   tiZANidine (ZANAFLEX) 4 MG tablet   DDD (degenerative disc disease), lumbar   Relevant Medications   oxyCODONE ER (XTAMPZA ER) 36 MG C12A (Start on 09/10/2019)   oxyCODONE ER (XTAMPZA ER) 36 MG C12A (Start on 10/09/2019)   tiZANidine (ZANAFLEX) 4 MG tablet   Long term current use of opiate analgesic (Chronic)   Musculoskeletal pain (Chronic)   Neurogenic pain (Chronic)   Sciatica of left side associated with disorder of lumbosacral spine   Relevant Medications   tiZANidine (ZANAFLEX) 4 MG tablet   Spondylosis of lumbar region without myelopathy or radiculopathy   Relevant Medications   oxyCODONE ER (XTAMPZA ER) 36 MG C12A (Start on 09/10/2019)   oxyCODONE ER (XTAMPZA ER) 36 MG C12A (Start on 10/09/2019)   tiZANidine (ZANAFLEX) 4 MG tablet    Other Visit Diagnoses    Facet arthritis of lumbar region       Relevant Medications   triamcinolone acetonide (KENALOG-40) injection 40 mg (Completed)   oxyCODONE ER (XTAMPZA ER) 36 MG C12A (Start on 09/10/2019)   oxyCODONE ER (XTAMPZA ER) 36 MG C12A (Start on 10/09/2019)   tiZANidine (ZANAFLEX) 4 MG tablet        ----------------------------------------------------------------------------------------------------------------------  1. Chronic pain syndrome .  We will proceed with a repeat epidural injection today.  I have  reviewed the [facility] practitioner database information and it is appropriate.  We will plan on repeat epidural injection today with refill of his medications for February 14 and March 15.  Scheduled for return to clinic in 2 months.  2. Long term current use of opiate analgesic As above  3. Musculoskeletal pain Continue core stretching strengthening exercises  4. Chronic left-sided low back pain with left-sided sciatica Epidural today with risks and benefits once again reviewed in full detail all questions answered.  5. DDD (degenerative disc disease), lumbar Continue core stretching strengthening  6. Spondylosis of lumbar region without myelopathy or radiculopathy   7. Facet arthritis of lumbar region   8. Neurogenic pain   9. Sciatica of left side associated with disorder of lumbosacral spine     ----------------------------------------------------------------------------------------------------------------------  I am having Peter Lamb start on [place] ER and tiZANidine. I am also having him maintain his aspirin, metFORMIN, sertraline, atenolol, diltiazem, lisinopril, Multiple Vitamins-Minerals (VITAMINS TO GO MEN PO), rosuvastatin, TraZODone HCl, glipiZIDE, nitroGLYCERIN, sertraline, sildenafil, Fish Oil, APPLE CIDER VINEGAR PO, VITAMIN E PO, Melatonin, HYDROcodone-acetaminophen, glucosamine-chondroitin, tiZANidine, Xtampza ER, and tadalafil. We administered triamcinolone acetonide, sodium chloride flush, ropivacaine (PF) 2 mg/mL (0.2%), lidocaine (PF), and iopamidol.   Meds ordered this encounter  Medications  . triamcinolone acetonide (KENALOG-40) injection 40 mg  . sodium chloride flush (NS) 0.9 % injection 10 mL  . ropivacaine (PF) 2 mg/mL (0.2%) (NAROPIN) injection 10 mL  . lidocaine (PF) (XYLOCAINE) 1 % injection 5 mL  . iopamidol (ISOVUE-M) 41 % intrathecal injection 20 mL  . oxyCODONE ER (XTAMPZA ER) 36 MG C12A    Sig: Take 2 capsules (72 mg total) by  mouth 2 (two) times daily.    Dispense:  120 capsule    Refill:  0  . oxyCODONE ER (XTAMPZA ER) 36 MG C12A    Sig: Take 2 capsules (72 mg total) by mouth 2 (two) times daily.    Dispense:  120 capsule    Refill:  0  .  tiZANidine (ZANAFLEX) 4 MG tablet    Sig: Take 1 tablet (4 mg total) by mouth 2 (two) times daily.    Dispense:  60 tablet    Refill:  4   Patient's Medications  New Prescriptions   OXYCODONE ER (XTAMPZA ER) 36 MG C12A    Take 2 capsules (72 mg total) by mouth 2 (two) times daily.   TIZANIDINE (ZANAFLEX) 4 MG TABLET    Take 1 tablet (4 mg total) by mouth 2 (two) times daily.  Previous Medications   APPLE CIDER VINEGAR PO    Take 480 mg by mouth daily.   ASPIRIN 81 MG TABLET    Take 162 mg by mouth daily.   ATENOLOL (TENORMIN) 100 MG TABLET    Take 50 mg by mouth 2 (two) times daily.   DILTIAZEM (DILTIAZEM CD) 240 MG 24 HR CAPSULE    Take 240 mg by mouth daily.   GLIPIZIDE (GLUCOTROL) 10 MG TABLET    Take 10 mg 2 (two) times daily by mouth.   GLUCOSAMINE-CHONDROITIN 500-400 MG TABLET    Take 1 tablet by mouth daily.   HYDROCODONE-ACETAMINOPHEN (NORCO/VICODIN) 5-325 MG TABLET    Take 1-2 tablets by mouth every 6 (six) hours as needed for moderate pain.   LISINOPRIL (PRINIVIL,ZESTRIL) 20 MG TABLET    Take 20 mg by mouth daily.   MELATONIN 10 MG TABS    Take by mouth at bedtime as needed.   METFORMIN (GLUCOPHAGE) 500 MG TABLET    Take 500 mg by mouth daily with breakfast. Reported on 02/10/2016   MULTIPLE VITAMINS-MINERALS (VITAMINS TO GO MEN PO)    Take 1 tablet by mouth daily.    NITROGLYCERIN (NITROSTAT) 0.4 MG SL TABLET    Place 1 tablet under tongue as needed for chest pain (may repeat every 5 minutes but seek medical help if pain persists after 3 tablets) as needed   OMEGA-3 FATTY ACIDS (FISH OIL) 1000 MG CAPS    Take by mouth daily.   ROSUVASTATIN (CRESTOR) 10 MG TABLET    Take 10 mg by mouth daily.   SERTRALINE (ZOLOFT) 100 MG TABLET    Take 200 mg by mouth daily.    SERTRALINE (ZOLOFT) 50 MG TABLET    Take 100 mg by mouth at bedtime.    SILDENAFIL (REVATIO) 20 MG TABLET    Take 20 mg by mouth as needed (1-5  PO prn).   TADALAFIL (CIALIS) 20 MG TABLET    Take 20 mg by mouth daily as needed.   TIZANIDINE (ZANAFLEX) 4 MG TABLET    Take 1 tablet (4 mg total) by mouth 2 (two) times daily.   TRAZODONE HCL 150 MG TB24    Take by mouth at bedtime.   VITAMIN E PO    Take 90 Units by mouth daily.  Modified Medications   Modified Medication Previous Medication   OXYCODONE ER (XTAMPZA ER) 36 MG C12A oxyCODONE ER (XTAMPZA ER) 36 MG C12A      Take 2 capsules (72 mg total) by mouth 2 (two) times daily.    Take 2 capsules (72 mg total) by mouth 2 (two) times daily.  Discontinued Medications   No medications on file   ----------------------------------------------------------------------------------------------------------------------  Follow-up: Return in about 2 months (around 11/05/2019) for evaluation, med refill.   Procedure: L5-S1 LESI with fluoroscopic guidance and no moderate sedation  NOTE: The risks, benefits, and expectations of the procedure have been discussed and explained to the patient who was understanding and  in agreement with suggested treatment plan. No guarantees were made.  DESCRIPTION OF PROCEDURE: Lumbar epidural steroid injection with no IV Versed, EKG, blood pressure, pulse, and pulse oximetry monitoring. The procedure was performed with the patient in the prone position under fluoroscopic guidance.  Sterile prep x3 was initiated and I then injected subcutaneous lidocaine to the overlying L5-S1 site after its fluoroscopic identifictation.  Using strict aseptic technique, I then advanced an 18-gauge Tuohy epidural needle in the midline using interlaminar approach via loss-of-resistance to saline technique. There was negative aspiration for heme or  CSF.  I then confirmed position with both AP and Lateral fluoroscan.  2 cc of contrast dye were  injected and a  total of 5 mL of Preservative-Free normal saline mixed with 40 mg of Kenalog and 1cc Ropicaine 0.2 percent were injected incrementally via the  epidurally placed needle. The needle was removed. The patient tolerated the injection well and was convalesced and discharged to home in stable condition. Should the patient have any post procedure difficulty they have been instructed on how to contact us for assistance.    Molli Barrows, MD

## 2019-09-07 NOTE — Progress Notes (Signed)
Safety precautions to be maintained throughout the outpatient stay will include: orient to surroundings, keep bed in low position, maintain call bell within reach at all times, provide assistance with transfer out of bed and ambulation.  

## 2019-09-07 NOTE — Patient Instructions (Addendum)
Epidural Steroid Injection Patient Information  Description: The epidural space surrounds the nerves as they exit the spinal cord.  In some patients, the nerves can be compressed and inflamed by a bulging disc or a tight spinal canal (spinal stenosis).  By injecting steroids into the epidural space, we can bring irritated nerves into direct contact with a potentially helpful medication.  These steroids act directly on the irritated nerves and can reduce swelling and inflammation which often leads to decreased pain.  Epidural steroids may be injected anywhere along the spine and from the neck to the low back depending upon the location of your pain.   After numbing the skin with local anesthetic (like Novocaine), a small needle is passed into the epidural space slowly.  You may experience a sensation of pressure while this is being done.  The entire block usually last less than 10 minutes.  Conditions which may be treated by epidural steroids:   Low back and leg pain  Neck and arm pain  Spinal stenosis  Post-laminectomy syndrome  Herpes zoster (shingles) pain  Pain from compression fractures  Preparation for the injection:  1. Do not eat any solid food or dairy products within 8 hours of your appointment.  2. You may drink clear liquids up to 3 hours before appointment.  Clear liquids include water, black coffee, juice or soda.  No milk or cream please. 3. You may take your regular medication, including pain medications, with a sip of water before your appointment  Diabetics should hold regular insulin (if taken separately) and take 1/2 normal NPH dos the morning of the procedure.  Carry some sugar containing items with you to your appointment. 4. A driver must accompany you and be prepared to drive you home after your procedure.  5. Bring all your current medications with your. 6. An IV may be inserted and sedation may be given at the discretion of the physician.   7. A blood pressure  cuff, EKG and other monitors will often be applied during the procedure.  Some patients may need to have extra oxygen administered for a short period. 8. You will be asked to provide medical information, including your allergies, prior to the procedure.  We must know immediately if you are taking blood thinners (like Coumadin/Warfarin)  Or if you are allergic to IV iodine contrast (dye). We must know if you could possible be pregnant.  Possible side-effects:  Bleeding from needle site  Infection (rare, may require surgery)  Nerve injury (rare)  Numbness & tingling (temporary)  Difficulty urinating (rare, temporary)  Spinal headache ( a headache worse with upright posture)  Light -headedness (temporary)  Pain at injection site (several days)  Decreased blood pressure (temporary)  Weakness in arm/leg (temporary)  Pressure sensation in back/neck (temporary)  Call if you experience:  Fever/chills associated with headache or increased back/neck pain.  Headache worsened by an upright position.  New onset weakness or numbness of an extremity below the injection site  Hives or difficulty breathing (go to the emergency room)  Inflammation or drainage at the infection site  Severe back/neck pain  Any new symptoms which are concerning to you  Please note:  Although the local anesthetic injected can often make your back or neck feel good for several hours after the injection, the pain will likely return.  It takes 3-7 days for steroids to work in the epidural space.  You may not notice any pain relief for at least that one week.    If effective, we will often do a series of three injections spaced 3-6 weeks apart to maximally decrease your pain.  After the initial series, we generally will wait several months before considering a repeat injection of the same type.  If you have any questions, please call [phone] [facility]  Discharge Instructions  General Discharge Instructions :  If you need to reach your doctor call: Monday-Friday 8:00 am - 4:00 pm at [phone] or toll free [phone].  After clinic hours [phone] to have operator reach doctor.  Bring all of your medication bottles to all your appointments in the pain clinic.  To cancel or reschedule your appointment with Pain Management please remember to call 24 hours in advance to avoid a fee.  Refer to the educational materials which you have been given on: General Risks, I had my Procedure. Discharge Instructions, Post Sedation.  Post Procedure Instructions:  The drugs you were given will stay in your system until tomorrow, so for the next 24 hours you should not drive, make any legal decisions or drink any alcoholic beverages.  You may eat anything you prefer, but it is better to start with liquids then soups and crackers, and gradually work up to solid foods.  Please notify your doctor immediately if you have any unusual bleeding, trouble breathing or pain that is not related to your normal pain.  Depending on the type of procedure that was done, some parts of your body may feel week and/or numb.  This usually clears up by tonight or the next day.  Walk with the use of an assistive device or accompanied by an adult for the 24 hours.  You may use ice on the affected area for the first 24 hours.  Put ice in a Ziploc bag and cover with a towel and place against area 15 minutes on 15 minutes off.  You may switch to heat after 24 hours. 

## 2019-11-06 ENCOUNTER — Other Ambulatory Visit: Payer: Self-pay

## 2019-11-06 ENCOUNTER — Ambulatory Visit: Payer: Medicare Other | Attending: Anesthesiology | Admitting: Anesthesiology

## 2019-11-06 ENCOUNTER — Encounter: Payer: Self-pay | Admitting: Anesthesiology

## 2019-11-06 DIAGNOSIS — G894 Chronic pain syndrome: Secondary | ICD-10-CM

## 2019-11-06 DIAGNOSIS — M47816 Spondylosis without myelopathy or radiculopathy, lumbar region: Secondary | ICD-10-CM

## 2019-11-06 DIAGNOSIS — M5136 Other intervertebral disc degeneration, lumbar region: Secondary | ICD-10-CM

## 2019-11-06 DIAGNOSIS — M7918 Myalgia, other site: Secondary | ICD-10-CM

## 2019-11-06 DIAGNOSIS — Z79891 Long term (current) use of opiate analgesic: Secondary | ICD-10-CM

## 2019-11-06 DIAGNOSIS — G8929 Other chronic pain: Secondary | ICD-10-CM

## 2019-11-06 DIAGNOSIS — M5442 Lumbago with sciatica, left side: Secondary | ICD-10-CM | POA: Diagnosis not present

## 2019-11-06 DIAGNOSIS — M792 Neuralgia and neuritis, unspecified: Secondary | ICD-10-CM

## 2019-11-06 MED ORDER — XTAMPZA ER 36 MG PO C12A: 2.0000 | EXTENDED_RELEASE_CAPSULE | Freq: Two times a day (BID) | ORAL | 0 refills | Status: DC

## 2019-11-06 MED ORDER — XTAMPZA ER 36 MG PO C12A: 2.0000 | EXTENDED_RELEASE_CAPSULE | Freq: Two times a day (BID) | ORAL | 0 refills | Status: AC

## 2019-11-07 ENCOUNTER — Telehealth: Payer: Self-pay | Admitting: *Deleted

## 2019-11-07 NOTE — Telephone Encounter (Signed)
Patient lvmail stating he has not spoke to Dr. Andree Elk. Dr. Andree Elk has sent meds to pharmacy per chart. Please verify this and let patient know status.

## 2019-11-07 NOTE — Progress Notes (Signed)
Virtual Visit via Telephone Note  I connected with [name] on 11/07/19 at 12:30 PM EDT by telephone and verified that I am speaking with the correct person using two identifiers.  Location: Patient: Home Provider: Pain control center   I discussed the limitations, risks, security and privacy concerns of performing an evaluation and management service by telephone and the availability of in person appointments. I also discussed with the patient that there may be a patient responsible charge related to this service. The patient expressed understanding and agreed to proceed.   History of Present Illness: I spoke with Peter Lamb regarding his chronic low back pain. He did well with his last epidural and continues to have similar pain but less severe than before. He reports that he has responded favorably to the epidural. He still takes his medications as prescribed but secondary to the expense and his insurance he is desiring to switch from the [facility] ER over to OxyContin. He has been on OxyContin in the past at the 40 mg strength and this was effective for him. Otherwise he reports that he is in his usual state of health no new changes in the quality characteristic or distribution of his low back pain. His leg pain has improved following the epidural. He is trying to stay active and keep his strength up. No change in bowel or bladder function is noted at this time.    Observations/Objective:  Current Outpatient Medications:  .  APPLE CIDER VINEGAR PO, Take 480 mg by mouth daily., Disp: , Rfl:  .  aspirin 81 MG tablet, Take 162 mg by mouth daily., Disp: , Rfl:  .  atenolol (TENORMIN) 100 MG tablet, Take 50 mg by mouth 2 (two) times daily., Disp: , Rfl:  .  diltiazem (DILTIAZEM CD) 240 MG 24 hr capsule, Take 240 mg by mouth daily., Disp: , Rfl:  .  glipiZIDE (GLUCOTROL) 10 MG tablet, Take 10 mg 2 (two) times daily by mouth., Disp: , Rfl: 3 .  glucosamine-chondroitin 500-400 MG tablet,  Take 1 tablet by mouth daily., Disp: , Rfl:  .  HYDROcodone-acetaminophen (NORCO/VICODIN) 5-325 MG tablet, Take 1-2 tablets by mouth every 6 (six) hours as needed for moderate pain. (Patient not taking: Reported on 09/07/2018), Disp: 20 tablet, Rfl: 0 .  lisinopril (PRINIVIL,ZESTRIL) 20 MG tablet, Take 20 mg by mouth daily., Disp: , Rfl:  .  Melatonin 10 MG TABS, Take by mouth at bedtime as needed., Disp: , Rfl:  .  metFORMIN (GLUCOPHAGE) 500 MG tablet, Take 500 mg by mouth daily with breakfast. Reported on 02/10/2016, Disp: , Rfl:  .  Multiple Vitamins-Minerals (VITAMINS TO GO MEN PO), Take 1 tablet by mouth daily. , Disp: , Rfl:  .  nitroGLYCERIN (NITROSTAT) 0.4 MG SL tablet, Place 1 tablet under tongue as needed for chest pain (may repeat every 5 minutes but seek medical help if pain persists after 3 tablets) as needed, Disp: , Rfl:  .  Omega-3 Fatty Acids (FISH OIL) 1000 MG CAPS, Take by mouth daily., Disp: , Rfl:  .  [START ON 11/08/2019] oxyCODONE ER (XTAMPZA ER) 36 MG C12A, Take 2 capsules (72 mg total) by mouth 2 (two) times daily., Disp: 120 capsule, Rfl: 0 .  [START ON 12/08/2019] oxyCODONE ER (XTAMPZA ER) 36 MG C12A, Take 2 capsules (72 mg total) by mouth 2 (two) times daily., Disp: 120 capsule, Rfl: 0 .  rosuvastatin (CRESTOR) 10 MG tablet, Take 10 mg by mouth daily., Disp: , Rfl:  .  sertraline (ZOLOFT) 100 MG tablet, Take 200 mg by mouth daily., Disp: , Rfl: 0 .  sertraline (ZOLOFT) 50 MG tablet, Take 100 mg by mouth at bedtime. , Disp: , Rfl:  .  sildenafil (REVATIO) 20 MG tablet, Take 20 mg by mouth as needed (1-5  PO prn)., Disp: , Rfl:  .  tadalafil (CIALIS) 20 MG tablet, Take 20 mg by mouth daily as needed., Disp: , Rfl:  .  tiZANidine (ZANAFLEX) 4 MG tablet, Take 1 tablet (4 mg total) by mouth 2 (two) times daily., Disp: 60 tablet, Rfl: 3 .  tiZANidine (ZANAFLEX) 4 MG tablet, Take 1 tablet (4 mg total) by mouth 2 (two) times daily., Disp: 60 tablet, Rfl: 4 .  TraZODone HCl 150 MG  TB24, Take by mouth at bedtime., Disp: , Rfl:  .  VITAMIN E PO, Take 90 Units by mouth daily., Disp: , Rfl:   Assessment and Plan:  1. Chronic pain syndrome   2. Long term current use of opiate analgesic   3. Musculoskeletal pain   4. Chronic left-sided low back pain with left-sided sciatica   5. DDD (degenerative disc disease), lumbar   6. Spondylosis of lumbar region without myelopathy or radiculopathy   7. Facet arthritis of lumbar region   8. Neurogenic pain   Based on our discussion today I am going to refill his Xtandi for April 14 and May 14. I have reviewed the [facility] practitioner database information and it is appropriate. Adalberto has been compliant with the medication regimen and this is worked well for him. I will schedule him for a 9-month return to clinic and we will likely switch him over to OxyContin at that point which will save him some money. He is to continue with stretching strengthening exercises and contact us should he have any problems with his pain management in the meantime and I have also requested that he continue with his primary care physicians for his baseline medical care. Follow Up Instructions:    I discussed the assessment and treatment plan with the patient. The patient was provided an opportunity to ask questions and all were answered. The patient agreed with the plan and demonstrated an understanding of the instructions.   The patient was advised to call back or seek an in-person evaluation if the symptoms worsen or if the condition fails to improve as anticipated.  I provided 25 minutes of non-face-to-face time during this encounter.   Molli Barrows, MD

## 2019-11-07 NOTE — Telephone Encounter (Signed)
Unable to talk with patient. Left message that I could see where orders where placed and that he also needed a UDS and could come by the office so we could get it. Not sure as far as why MD has not called.

## 2019-11-28 ENCOUNTER — Other Ambulatory Visit: Payer: Self-pay | Admitting: Anesthesiology

## 2019-11-29 ENCOUNTER — Ambulatory Visit: Payer: Medicare Other | Admitting: Dermatology

## 2019-11-29 ENCOUNTER — Other Ambulatory Visit: Payer: Self-pay

## 2019-11-29 DIAGNOSIS — L578 Other skin changes due to chronic exposure to nonionizing radiation: Secondary | ICD-10-CM | POA: Diagnosis not present

## 2019-11-29 DIAGNOSIS — L570 Actinic keratosis: Secondary | ICD-10-CM

## 2019-11-29 DIAGNOSIS — L821 Other seborrheic keratosis: Secondary | ICD-10-CM

## 2019-11-29 DIAGNOSIS — L820 Inflamed seborrheic keratosis: Secondary | ICD-10-CM | POA: Diagnosis not present

## 2019-11-29 NOTE — Progress Notes (Signed)
   Follow-Up Visit   Subjective  Peter Lamb is a 63 y.o. male who presents for the following: AK follow up (Patient states that the AK's have cleared and he has no new concerning areas.).  The following portions of the chart were reviewed this encounter and updated as appropriate:  Tobacco  Allergies  Meds  Problems  Med Hx  Surg Hx  Fam Hx      Review of Systems:  No other skin or systemic complaints except as noted in HPI or Assessment and Plan.  Objective  Well appearing patient in no apparent distress; mood and affect are within normal limits.  A focused examination was performed including Scalp, face and hands. Relevant physical exam findings are noted in the Assessment and Plan.  Objective  Scalp x , Face x 16 (16): Erythematous thin papules/macules with gritty scale.   Objective  Right thumb, left distal dorsum forearm (2): Erythematous keratotic or waxy stuck-on papule or plaque.    Assessment & Plan  AK (actinic keratosis) (16) Scalp x , Face x 16  Cryotherapy today Prior to procedure, discussed risks of blister formation, small wound, skin dyspigmentation, or rare scar following cryotherapy.    Destruction of lesion - Scalp x , Face x 16 Complexity: simple   Destruction method: cryotherapy   Informed consent: discussed and consent obtained   Timeout:  patient name, date of birth, surgical site, and procedure verified Lesion destroyed using liquid nitrogen: Yes   Region frozen until ice ball extended beyond lesion: Yes   Outcome: patient tolerated procedure well with no complications   Post-procedure details: wound care instructions given    Inflamed seborrheic keratosis (2) Right thumb, left distal dorsum forearm  Cryotherapy today Prior to procedure, discussed risks of blister formation, small wound, skin dyspigmentation, or rare scar following cryotherapy.    Destruction of lesion - Right thumb, left distal dorsum forearm Complexity: simple    Destruction method: cryotherapy   Informed consent: discussed and consent obtained   Timeout:  patient name, date of birth, surgical site, and procedure verified Lesion destroyed using liquid nitrogen: Yes   Region frozen until ice ball extended beyond lesion: Yes   Outcome: patient tolerated procedure well with no complications   Post-procedure details: wound care instructions given    Actinic Damage - diffuse scaly erythematous macules with underlying dyspigmentation - Recommend daily broad spectrum sunscreen SPF 30+ to sun-exposed areas, reapply every 2 hours as needed.  - Call for new or changing lesions.  Seborrheic Keratoses - Stuck-on, waxy, tan-brown papules and plaques  - Discussed benign etiology and prognosis. - Observe - Call for any changes  Return for 2 to 3 months AK Follow up. Marene Lenz, CMA, am acting as scribe for Sarina Ser, MD .  Documentation: I have reviewed the above documentation for accuracy and completeness, and I agree with the above.  Sarina Ser, MD

## 2019-11-29 NOTE — Patient Instructions (Addendum)
Recommend daily broad spectrum sunscreen SPF 30+ to sun-exposed areas, reapply every 2 hours as needed. Call for new or changing lesions.  Cryotherapy Aftercare  . Wash gently with soap and water everyday.   . Apply Vaseline and Band-Aid daily until healed.  Prior to procedure, discussed risks of blister formation, small wound, skin dyspigmentation, or rare scar following cryotherapy.   

## 2019-12-02 ENCOUNTER — Encounter: Payer: Self-pay | Admitting: Dermatology

## 2020-01-03 ENCOUNTER — Ambulatory Visit: Payer: Medicare Other | Admitting: Anesthesiology

## 2020-01-03 ENCOUNTER — Telehealth: Payer: Self-pay | Admitting: *Deleted

## 2020-01-03 NOTE — Telephone Encounter (Signed)
He will need a face to face visit.. NO PHONE REFILLS on narcotics. Please call and reschedule him.

## 2020-01-04 ENCOUNTER — Telehealth: Payer: Self-pay | Admitting: *Deleted

## 2020-01-09 ENCOUNTER — Other Ambulatory Visit: Payer: Self-pay

## 2020-01-09 ENCOUNTER — Ambulatory Visit: Payer: Medicare Other | Attending: Anesthesiology | Admitting: Anesthesiology

## 2020-01-09 ENCOUNTER — Encounter: Payer: Self-pay | Admitting: Anesthesiology

## 2020-01-09 DIAGNOSIS — M4716 Other spondylosis with myelopathy, lumbar region: Secondary | ICD-10-CM

## 2020-01-09 DIAGNOSIS — M5136 Other intervertebral disc degeneration, lumbar region: Secondary | ICD-10-CM

## 2020-01-09 DIAGNOSIS — M5442 Lumbago with sciatica, left side: Secondary | ICD-10-CM

## 2020-01-09 DIAGNOSIS — M7918 Myalgia, other site: Secondary | ICD-10-CM | POA: Diagnosis not present

## 2020-01-09 DIAGNOSIS — Z79891 Long term (current) use of opiate analgesic: Secondary | ICD-10-CM | POA: Diagnosis not present

## 2020-01-09 DIAGNOSIS — G8929 Other chronic pain: Secondary | ICD-10-CM

## 2020-01-09 DIAGNOSIS — M792 Neuralgia and neuritis, unspecified: Secondary | ICD-10-CM

## 2020-01-09 DIAGNOSIS — M47816 Spondylosis without myelopathy or radiculopathy, lumbar region: Secondary | ICD-10-CM

## 2020-01-09 DIAGNOSIS — G894 Chronic pain syndrome: Secondary | ICD-10-CM | POA: Diagnosis not present

## 2020-01-09 MED ORDER — XTAMPZA ER 36 MG PO C12A: 2.0000 | EXTENDED_RELEASE_CAPSULE | Freq: Two times a day (BID) | ORAL | 0 refills | Status: AC

## 2020-01-09 MED ORDER — XTAMPZA ER 36 MG PO C12A: 2.0000 | EXTENDED_RELEASE_CAPSULE | Freq: Two times a day (BID) | ORAL | 0 refills | Status: DC

## 2020-01-10 NOTE — Progress Notes (Signed)
Virtual Visit via Telephone Note  I connected with Peter Lamb on 01/10/20 at  2:00 PM EDT by telephone and verified that I am speaking with the correct person using two identifiers.  Location: Patient: Home Provider: Pain control center   I discussed the limitations, risks, security and privacy concerns of performing an evaluation and management service by telephone and the availability of in person appointments. I also discussed with the patient that there may be a patient responsible charge related to this service. The patient expressed understanding and agreed to proceed.   History of Present Illness: I spoke with Peter Lamb yesterday via telephone.  He was unable to do the video portion of the virtual conference.  He reports that his back pain has been stable in nature with no significant changes recently.  The quality characteristic and distribution of his low back pain and leg pain are stable in nature.  No reported changes in lower extremity strength or function or bowel or bladder function are noted at this time.  He continues to take his medications as prescribed and these continue to work for him effectively.  No side effects are reported.  He continues to derive good functional lifestyle improvement with the opioid medication therapy that he is currently on.  He has reported a slight increase and some sciatica-like symptoms and is interested in proceeding with a repeat epidural in the near future.  He has these periodically to keep his sciatica under control.  He is considered a nonsurgical candidate.    Observations/Objective:  Current Outpatient Medications:  .  aspirin 81 MG tablet, Take 162 mg by mouth daily., Disp: , Rfl:  .  atenolol (TENORMIN) 100 MG tablet, Take 50 mg by mouth 2 (two) times daily., Disp: , Rfl:  .  diltiazem (DILTIAZEM CD) 240 MG 24 hr capsule, Take 240 mg by mouth daily., Disp: , Rfl:  .  glipiZIDE (GLUCOTROL) 10 MG tablet, Take 10 mg 2 (two) times daily  by mouth., Disp: , Rfl: 3 .  glucosamine-chondroitin 500-400 MG tablet, Take 1 tablet by mouth daily., Disp: , Rfl:  .  HYDROcodone-acetaminophen (NORCO/VICODIN) 5-325 MG tablet, Take 1-2 tablets by mouth every 6 (six) hours as needed for moderate pain. (Patient not taking: Reported on 11/29/2019), Disp: 20 tablet, Rfl: 0 .  lisinopril (PRINIVIL,ZESTRIL) 20 MG tablet, Take 20 mg by mouth daily., Disp: , Rfl:  .  Melatonin 10 MG TABS, Take by mouth at bedtime as needed., Disp: , Rfl:  .  metFORMIN (GLUCOPHAGE) 500 MG tablet, Take 500 mg by mouth daily with breakfast. Reported on 02/10/2016, Disp: , Rfl:  .  Multiple Vitamins-Minerals (VITAMINS TO GO MEN PO), Take 1 tablet by mouth daily. , Disp: , Rfl:  .  nitroGLYCERIN (NITROSTAT) 0.4 MG SL tablet, Place 1 tablet under tongue as needed for chest pain (may repeat every 5 minutes but seek medical help if pain persists after 3 tablets) as needed, Disp: , Rfl:  .  Omega-3 Fatty Acids (FISH OIL) 1000 MG CAPS, Take by mouth daily., Disp: , Rfl:  .  oxyCODONE ER (XTAMPZA ER) 36 MG C12A, Take 2 capsules (72 mg total) by mouth 2 (two) times daily., Disp: 120 capsule, Rfl: 0 .  [START ON 02/08/2020] oxyCODONE ER (XTAMPZA ER) 36 MG C12A, Take 2 capsules (72 mg total) by mouth 2 (two) times daily., Disp: 120 capsule, Rfl: 0 .  rosuvastatin (CRESTOR) 10 MG tablet, Take 10 mg by mouth daily., Disp: , Rfl:  .  sertraline (ZOLOFT) 100 MG tablet, Take 200 mg by mouth daily., Disp: , Rfl: 0 .  sertraline (ZOLOFT) 50 MG tablet, Take 100 mg by mouth at bedtime. , Disp: , Rfl:  .  sildenafil (REVATIO) 20 MG tablet, Take 20 mg by mouth as needed (1-5  PO prn)., Disp: , Rfl:  .  tadalafil (CIALIS) 20 MG tablet, Take 20 mg by mouth daily as needed., Disp: , Rfl:  .  tiZANidine (ZANAFLEX) 4 MG tablet, Take 1 tablet (4 mg total) by mouth 2 (two) times daily., Disp: 60 tablet, Rfl: 3 .  tiZANidine (ZANAFLEX) 4 MG tablet, Take 1 tablet (4 mg total) by mouth 2 (two) times daily.,  Disp: 60 tablet, Rfl: 4 .  TraZODone HCl 150 MG TB24, Take by mouth at bedtime., Disp: , Rfl:  .  VITAMIN E PO, Take 90 Units by mouth daily., Disp: , Rfl:   Assessment and Plan: 1. Chronic pain syndrome   2. Long term current use of opiate analgesic   3. Musculoskeletal pain   4. Chronic left-sided low back pain with left-sided sciatica   5. DDD (degenerative disc disease), lumbar   6. Spondylosis of lumbar region without myelopathy or radiculopathy   7. Facet arthritis of lumbar region   8. Neurogenic pain   9. Lumbar spondylosis with myelopathy   Based on review of the [facility] practitioner database information I think it is reasonable to continue his current medication therapy.  Refills will be given for June 15 and July 15.  We will schedule him for return to clinic in approximately 2 months and a possible repeat epidural injection at that time.  In the meantime schedule him for routine urine for urine drug screen.  He is to continue follow-up with his primary care physicians for his baseline medical care.  Follow Up Instructions:    I discussed the assessment and treatment plan with the patient. The patient was provided an opportunity to ask questions and all were answered. The patient agreed with the plan and demonstrated an understanding of the instructions.   The patient was advised to call back or seek an in-person evaluation if the symptoms worsen or if the condition fails to improve as anticipated.  I provided 30 minutes of non-face-to-face time during this encounter.   Molli Barrows, MD

## 2020-01-25 ENCOUNTER — Other Ambulatory Visit: Payer: Self-pay | Admitting: Anesthesiology

## 2020-02-20 ENCOUNTER — Encounter: Payer: Self-pay | Admitting: Podiatry

## 2020-02-20 ENCOUNTER — Other Ambulatory Visit: Payer: Self-pay

## 2020-02-20 ENCOUNTER — Ambulatory Visit: Payer: Medicare Other | Admitting: Podiatry

## 2020-02-20 DIAGNOSIS — L84 Corns and callosities: Secondary | ICD-10-CM | POA: Diagnosis not present

## 2020-02-20 DIAGNOSIS — M2041 Other hammer toe(s) (acquired), right foot: Secondary | ICD-10-CM

## 2020-02-20 DIAGNOSIS — M2042 Other hammer toe(s) (acquired), left foot: Secondary | ICD-10-CM

## 2020-02-20 NOTE — Progress Notes (Signed)
Subjective:  Patient ID: Peter Lamb, male    DOB: 1956/01/02,  MRN: [id number]  Chief Complaint  Patient presents with  . Callouses    Patient presents today for painful callous lesion between right 4th,5th toes x 2-3 months.  He says it stings and burns and he has been using neosporin for treatment    64 y.o. male presents with the above complaint.  Patient presents with complaint of heloma molle/painful hyperkeratotic lesion between the fourth and fifth digit.  Patient states been going for 2 to 3 months has progressive gotten worse.  Patient is a diabetic with last A1c of 7.  He states that it stings burns he has been using Norvasc for for treatment.  He wears various shoes which are very tight up in the toebox region.  He has not made any shoe gear modification.  He denies any other acute complaints.  He has not tried any other treatment options.  He has not seen anyone else prior to seeing me.   Review of Systems: Negative except as noted in the HPI. Denies N/V/F/Ch.  Past Medical History:  Diagnosis Date  . Arthritis    back, neck  . Carpal boss of right wrist   . CHF (congestive heart failure) ([place])   . Depression   . Diabetes mellitus without complication ([place])   . GERD (gastroesophageal reflux disease)   . H/O tooth extraction   . Hyperlipidemia   . Hypertension     Current Outpatient Medications:  .  aspirin 81 MG tablet, Take 162 mg by mouth daily., Disp: , Rfl:  .  atenolol (TENORMIN) 100 MG tablet, Take 50 mg by mouth 2 (two) times daily., Disp: , Rfl:  .  diltiazem (DILTIAZEM CD) 240 MG 24 hr capsule, Take 240 mg by mouth daily., Disp: , Rfl:  .  glipiZIDE (GLUCOTROL) 10 MG tablet, Take 10 mg 2 (two) times daily by mouth., Disp: , Rfl: 3 .  glucosamine-chondroitin 500-400 MG tablet, Take 1 tablet by mouth daily., Disp: , Rfl:  .  Melatonin 10 MG TABS, Take by mouth at bedtime as needed., Disp: , Rfl:  .  metFORMIN (GLUCOPHAGE) 500 MG tablet, Take 500 mg by mouth  daily with breakfast. Reported on 02/10/2016, Disp: , Rfl:  .  Multiple Vitamins-Minerals (VITAMINS TO GO MEN PO), Take 1 tablet by mouth daily. , Disp: , Rfl:  .  nitroGLYCERIN (NITROSTAT) 0.4 MG SL tablet, Place 1 tablet under tongue as needed for chest pain (may repeat every 5 minutes but seek medical help if pain persists after 3 tablets) as needed, Disp: , Rfl:  .  Omega-3 Fatty Acids (FISH OIL) 1000 MG CAPS, Take by mouth daily., Disp: , Rfl:  .  oxyCODONE ER (XTAMPZA ER) 36 MG C12A, Take 2 capsules (72 mg total) by mouth 2 (two) times daily., Disp: 120 capsule, Rfl: 0 .  rosuvastatin (CRESTOR) 10 MG tablet, Take 10 mg by mouth daily., Disp: , Rfl:  .  tadalafil (CIALIS) 20 MG tablet, Take 20 mg by mouth daily as needed., Disp: , Rfl:  .  tiZANidine (ZANAFLEX) 4 MG tablet, TAKE 1 TABLET (4 MG TOTAL) BY MOUTH 2 (TWO) TIMES DAILY., Disp: 180 tablet, Rfl: 1 .  TraZODone HCl 150 MG TB24, Take by mouth at bedtime., Disp: , Rfl:  .  VITAMIN E PO, Take 90 Units by mouth daily., Disp: , Rfl:   Social History   Tobacco Use  Smoking Status Former Smoker  . Quit  date: 02/12/1994  . Years since quitting: 26.0  Smokeless Tobacco Former User    No Known Allergies Objective:  There were no vitals filed for this visit. There is no height or weight on file to calculate BMI. Constitutional Well developed. Well nourished.  Vascular Dorsalis pedis pulses palpable bilaterally. Posterior tibial pulses palpable bilaterally. Capillary refill normal to all digits.  No cyanosis or clubbing noted. Pedal hair growth normal.  Neurologic Normal speech. Oriented to person, place, and time. Epicritic sensation to light touch grossly present bilaterally.  Dermatologic Nails well groomed and normal in appearance. No open wounds. No skin lesions.  Orthopedic:  Hammertoe contractures of digits 2 through 5 semiflexible in nature noted.  There is adductovarus rotation of the fifth digit on the right side noted.   Heloma molle/hyperkeratotic lesion of medial aspect of the fifth digit against the lateral aspect of the fourth digit noted.  No underlying wound noted upon debridement.  No signs of infection noted.   Radiographs: None Assessment:   1. Hammertoes of both feet   2. Heloma molle    Plan:  Patient was evaluated and treated and all questions answered.  Right fourth digit and fifth digit heloma molle -I explained to patient the etiology of heloma molle and various treatment options were discussed.  Given that there is toe contracture with frontal plane rotation is likely the culprit with external forces that are pressing against the digits likely the shoe leading to excessive pressure in the interdigital space between the fourth and fifth leading to formation of heloma molle.  Patient understood the etiology of it and would like to discuss treatment options for which I recommended a toe spacer and wide gear shoe modification.  Patient states understanding will use them right away or get them right away.   Hammertoe contractures 2 through 5 bilaterally -I explained patient the etiology of hammertoe contractures and various treatment options were discussed.  Given that patient is a diabetic with deformities of the toes I believe he will benefit from diabetic shoes to aggressively offload his/distribute the pressure evenly across the entire bottom part of the sole.  Patient states understanding and will obtain them regularly.   No follow-ups on file.

## 2020-02-25 ENCOUNTER — Other Ambulatory Visit: Payer: Self-pay | Admitting: Anesthesiology

## 2020-03-06 ENCOUNTER — Other Ambulatory Visit: Payer: Self-pay | Admitting: Anesthesiology

## 2020-03-06 ENCOUNTER — Ambulatory Visit
Admission: RE | Admit: 2020-03-06 | Discharge: 2020-03-06 | Disposition: A | Discharge status: Home / Self Care | Payer: Medicare Other | Source: Ambulatory Visit | Attending: Anesthesiology | Admitting: Anesthesiology

## 2020-03-06 ENCOUNTER — Encounter: Payer: Self-pay | Admitting: Anesthesiology

## 2020-03-06 ENCOUNTER — Other Ambulatory Visit: Payer: Self-pay

## 2020-03-06 ENCOUNTER — Ambulatory Visit (HOSPITAL_BASED_OUTPATIENT_CLINIC_OR_DEPARTMENT_OTHER): Payer: Medicare Other | Admitting: Anesthesiology

## 2020-03-06 VITALS — BP 155/105 | HR 56 | Temp 97.4°F | Resp 16 | Ht 70.0 in | Wt 210.0 lb

## 2020-03-06 DIAGNOSIS — M4716 Other spondylosis with myelopathy, lumbar region: Secondary | ICD-10-CM

## 2020-03-06 DIAGNOSIS — M792 Neuralgia and neuritis, unspecified: Secondary | ICD-10-CM

## 2020-03-06 DIAGNOSIS — R52 Pain, unspecified: Secondary | ICD-10-CM

## 2020-03-06 DIAGNOSIS — M5442 Lumbago with sciatica, left side: Secondary | ICD-10-CM | POA: Diagnosis present

## 2020-03-06 DIAGNOSIS — G8929 Other chronic pain: Secondary | ICD-10-CM | POA: Diagnosis present

## 2020-03-06 DIAGNOSIS — M47816 Spondylosis without myelopathy or radiculopathy, lumbar region: Secondary | ICD-10-CM | POA: Insufficient documentation

## 2020-03-06 DIAGNOSIS — M7918 Myalgia, other site: Secondary | ICD-10-CM | POA: Insufficient documentation

## 2020-03-06 DIAGNOSIS — Z79891 Long term (current) use of opiate analgesic: Secondary | ICD-10-CM

## 2020-03-06 DIAGNOSIS — M5136 Other intervertebral disc degeneration, lumbar region: Secondary | ICD-10-CM | POA: Insufficient documentation

## 2020-03-06 DIAGNOSIS — G894 Chronic pain syndrome: Secondary | ICD-10-CM | POA: Diagnosis present

## 2020-03-06 MED ORDER — IOHEXOL 180 MG/ML  SOLN
10.0000 mL | Freq: Once | INTRAMUSCULAR | Status: AC | PRN
  Administered 2020-03-06: 10 mL via EPIDURAL

## 2020-03-06 MED ORDER — ROPIVACAINE HCL 2 MG/ML IJ SOLN
INTRAMUSCULAR | Status: AC
  Filled 2020-03-06: qty 10

## 2020-03-06 MED ORDER — TRIAMCINOLONE ACETONIDE 40 MG/ML IJ SUSP
40.0000 mg | Freq: Once | INTRAMUSCULAR | Status: AC
  Administered 2020-03-06: 40 mg

## 2020-03-06 MED ORDER — LIDOCAINE HCL 2 % IJ SOLN
INTRAMUSCULAR | Status: AC
  Filled 2020-03-06: qty 20

## 2020-03-06 MED ORDER — LIDOCAINE HCL (PF) 1 % IJ SOLN
5.0000 mL | Freq: Once | INTRAMUSCULAR | Status: AC
  Administered 2020-03-06: 5 mL via SUBCUTANEOUS

## 2020-03-06 MED ORDER — XTAMPZA ER 36 MG PO C12A: 2.0000 | EXTENDED_RELEASE_CAPSULE | Freq: Two times a day (BID) | ORAL | 0 refills | Status: AC

## 2020-03-06 MED ORDER — SODIUM CHLORIDE 0.9% FLUSH
10.0000 mL | Freq: Once | INTRAVENOUS | Status: AC
  Administered 2020-03-06: 10 mL

## 2020-03-06 MED ORDER — XTAMPZA ER 36 MG PO C12A: 2.0000 | EXTENDED_RELEASE_CAPSULE | Freq: Two times a day (BID) | ORAL | 0 refills | Status: DC

## 2020-03-06 MED ORDER — SODIUM CHLORIDE (PF) 0.9 % IJ SOLN
INTRAMUSCULAR | Status: AC
  Filled 2020-03-06: qty 10

## 2020-03-06 MED ORDER — TRIAMCINOLONE ACETONIDE 40 MG/ML IJ SUSP
INTRAMUSCULAR | Status: AC
  Filled 2020-03-06: qty 1

## 2020-03-06 MED ORDER — ROPIVACAINE HCL 2 MG/ML IJ SOLN
10.0000 mL | Freq: Once | INTRAMUSCULAR | Status: AC
  Administered 2020-03-06: 10 mL via EPIDURAL

## 2020-03-06 NOTE — Progress Notes (Signed)
Nursing Pain Medication Assessment:  Safety precautions to be maintained throughout the outpatient stay will include: orient to surroundings, keep bed in low position, maintain call bell within reach at all times, provide assistance with transfer out of bed and ambulation.  Medication Inspection Compliance: Pill count conducted under aseptic conditions, in front of the patient. Neither the pills nor the bottle was removed from the patient's sight at any time. Once count was completed pills were immediately returned to the patient in their original bottle.  Medication: xtampza Pill/Patch Count: 10 of 120 pills remain Pill/Patch Appearance: Markings consistent with prescribed medication Bottle Appearance: Standard pharmacy container. Clearly labeled. Filled Date:07 / 15 / 2021 Last Medication intake:  Today

## 2020-03-06 NOTE — Patient Instructions (Signed)
Pain Management Discharge Instructions  General Discharge Instructions :  If you need to reach your doctor call: Monday-Friday 8:00 am - 4:00 pm at [phone] or toll free [phone].  After clinic hours [phone] to have operator reach doctor.  Bring all of your medication bottles to all your appointments in the pain clinic.  To cancel or reschedule your appointment with Pain Management please remember to call 24 hours in advance to avoid a fee.  Refer to the educational materials which you have been given on: General Risks, I had my Procedure. Discharge Instructions, Post Sedation.  Post Procedure Instructions:  The drugs you were given will stay in your system until tomorrow, so for the next 24 hours you should not drive, make any legal decisions or drink any alcoholic beverages.  You may eat anything you prefer, but it is better to start with liquids then soups and crackers, and gradually work up to solid foods.  Please notify your doctor immediately if you have any unusual bleeding, trouble breathing or pain that is not related to your normal pain.  Depending on the type of procedure that was done, some parts of your body may feel week and/or numb.  This usually clears up by tonight or the next day.  Walk with the use of an assistive device or accompanied by an adult for the 24 hours.  You may use ice on the affected area for the first 24 hours.  Put ice in a Ziploc bag and cover with a towel and place against area 15 minutes on 15 minutes off.  You may switch to heat after 24 hours.Epidural Steroid Injection Patient Information  Description: The epidural space surrounds the nerves as they exit the spinal cord.  In some patients, the nerves can be compressed and inflamed by a bulging disc or a tight spinal canal (spinal stenosis).  By injecting steroids into the epidural space, we can bring irritated nerves into direct contact with a potentially helpful medication.  These  steroids act directly on the irritated nerves and can reduce swelling and inflammation which often leads to decreased pain.  Epidural steroids may be injected anywhere along the spine and from the neck to the low back depending upon the location of your pain.   After numbing the skin with local anesthetic (like Novocaine), a small needle is passed into the epidural space slowly.  You may experience a sensation of pressure while this is being done.  The entire block usually last less than 10 minutes.  Conditions which may be treated by epidural steroids:   Low back and leg pain  Neck and arm pain  Spinal stenosis  Post-laminectomy syndrome  Herpes zoster (shingles) pain  Pain from compression fractures  Preparation for the injection:  1. Do not eat any solid food or dairy products within 8 hours of your appointment.  2. You may drink clear liquids up to 3 hours before appointment.  Clear liquids include water, black coffee, juice or soda.  No milk or cream please. 3. You may take your regular medication, including pain medications, with a sip of water before your appointment  Diabetics should hold regular insulin (if taken separately) and take 1/2 normal NPH dos the morning of the procedure.  Carry some sugar containing items with you to your appointment. 4. A driver must accompany you and be prepared to drive you home after your procedure.  5. Bring all your current medications with your. 6. An IV may be inserted and   sedation may be given at the discretion of the physician.   7. A blood pressure cuff, EKG and other monitors will often be applied during the procedure.  Some patients may need to have extra oxygen administered for a short period. 8. You will be asked to provide medical information, including your allergies, prior to the procedure.  We must know immediately if you are taking blood thinners (like Coumadin/Warfarin)  Or if you are allergic to IV iodine contrast (dye). We must  know if you could possible be pregnant.  Possible side-effects:  Bleeding from needle site  Infection (rare, may require surgery)  Nerve injury (rare)  Numbness & tingling (temporary)  Difficulty urinating (rare, temporary)  Spinal headache ( a headache worse with upright posture)  Light -headedness (temporary)  Pain at injection site (several days)  Decreased blood pressure (temporary)  Weakness in arm/leg (temporary)  Pressure sensation in back/neck (temporary)  Call if you experience:  Fever/chills associated with headache or increased back/neck pain.  Headache worsened by an upright position.  New onset weakness or numbness of an extremity below the injection site  Hives or difficulty breathing (go to the emergency room)  Inflammation or drainage at the infection site  Severe back/neck pain  Any new symptoms which are concerning to you  Please note:  Although the local anesthetic injected can often make your back or neck feel good for several hours after the injection, the pain will likely return.  It takes 3-7 days for steroids to work in the epidural space.  You may not notice any pain relief for at least that one week.  If effective, we will often do a series of three injections spaced 3-6 weeks apart to maximally decrease your pain.  After the initial series, we generally will wait several months before considering a repeat injection of the same type.  If you have any questions, please call [phone] [facility] 

## 2020-03-06 NOTE — Progress Notes (Signed)
Subjective:  Patient ID: Peter Lamb, male    DOB: 21-Feb-1956  Age: 64 y.o. MRN: [id number]  CC: Back Pain   Procedure: L5-S1 epidural steroid under fluoroscopic guidance with no sedation  HPI Peter Lamb presents for reevaluation.  Peter Lamb reports that he is having recurrent low back pain with left lateral sciatica symptoms affecting the left calf with cramping.  The pain is similar to what he is experienced in the past and he is responded favorably in the past epidural steroid injections.  He desires to proceed with that today.  With the chronic back pain and other diffuse body pain he continues to take his Xtampza ER with no side effects and this continues to work well for him.  Based on his narcotic assessment sheet he continues to derive good functional lifestyle improvement with the medication.  The combination of medication and periodic epidurals keeps his pain under good control and he is staying active trying to walk and retain his functional capacity.  Otherwise he is in his usual state of health no new changes in bowel or bladder function or lower extremity strength or function.  Outpatient Medications Prior to Visit  Medication Sig Dispense Refill  . aspirin 81 MG tablet Take 162 mg by mouth daily.    Marland Kitchen atenolol (TENORMIN) 100 MG tablet Take 50 mg by mouth 2 (two) times daily.    Marland Kitchen diltiazem (DILTIAZEM CD) 240 MG 24 hr capsule Take 240 mg by mouth daily.    Marland Kitchen glipiZIDE (GLUCOTROL) 10 MG tablet Take 10 mg 2 (two) times daily by mouth.  3  . glucosamine-chondroitin 500-400 MG tablet Take 1 tablet by mouth daily.    . Melatonin 10 MG TABS Take by mouth at bedtime as needed.    . metFORMIN (GLUCOPHAGE) 500 MG tablet Take 500 mg by mouth daily with breakfast. Reported on 02/10/2016    . Multiple Vitamins-Minerals (VITAMINS TO GO MEN PO) Take 1 tablet by mouth daily.     . nitroGLYCERIN (NITROSTAT) 0.4 MG SL tablet Place 1 tablet under tongue as needed for chest pain (may repeat  every 5 minutes but seek medical help if pain persists after 3 tablets) as needed    . Omega-3 Fatty Acids (FISH OIL) 1000 MG CAPS Take by mouth daily.    . rosuvastatin (CRESTOR) 10 MG tablet Take 10 mg by mouth daily.    . tadalafil (CIALIS) 20 MG tablet Take 20 mg by mouth daily as needed.    Marland Kitchen tiZANidine (ZANAFLEX) 4 MG tablet TAKE 1 TABLET (4 MG TOTAL) BY MOUTH 2 (TWO) TIMES DAILY. 180 tablet 1  . TraZODone HCl 150 MG TB24 Take by mouth at bedtime.    Marland Kitchen VITAMIN E PO Take 90 Units by mouth daily.    Marland Kitchen oxyCODONE ER (XTAMPZA ER) 36 MG C12A Take 2 capsules (72 mg total) by mouth 2 (two) times daily. 120 capsule 0   No facility-administered medications prior to visit.    Review of Systems CNS: No confusion or sedation Cardiac: No angina or palpitations GI: No abdominal pain or constipation Constitutional: No nausea vomiting fevers or chills  Objective:  BP (!) 155/105   Pulse (!) 56   Temp (!) 97.4 F (36.3 C)   Resp 16   Ht 5\' 10"  (1.778 m)   Wt 210 lb (95.3 kg)   SpO2 98%   BMI 30.13 kg/m    BP Readings from Last 3 Encounters:  03/06/20 (!) 155/105  09/07/19 Marland Kitchen)  147/90  02/09/19 (!) 150/103     Wt Readings from Last 3 Encounters:  03/06/20 210 lb (95.3 kg)  09/07/19 214 lb (97.1 kg)  02/09/19 214 lb (97.1 kg)     Physical Exam Pt is alert and oriented PERRL EOMI HEART IS RRR no murmur or rub LCTA no wheezing or rales MUSCULOSKELETAL reveals some paraspinous muscle tenderness but no overt trigger points.  He has a positive straight leg raise on the left side negative on the right  Labs  Lab Results  Component Value Date   HGBA1C 9.0 03/04/2010   Lab Results  Component Value Date   CREATININE 0.92 08/21/2016    -------------------------------------------------------------------------------------------------------------------- Lab Results  Component Value Date   WBC 9.2 08/21/2016   HGB 15.3 08/21/2016   HCT 43.6 08/21/2016   PLT 219 08/21/2016    GLUCOSE 132 (H) 08/21/2016   ALT 31 03/04/2010   AST 23 03/04/2010   NA 135 08/21/2016   K 3.7 08/21/2016   CL 100 (L) 08/21/2016   CREATININE 0.92 08/21/2016   BUN 11 08/21/2016   CO2 26 08/21/2016   PSA 0.26 03/04/2010   HGBA1C 9.0 03/04/2010    --------------------------------------------------------------------------------------------------------------------- DG PAIN CLINIC C-ARM 1-60 MIN NO REPORT  Result Date: 03/06/2020 Fluoro was used, but no Radiologist interpretation will be provided. Please refer to "NOTES" tab for provider progress note.    Assessment & Plan:   Peter Lamb was seen today for back pain.  Diagnoses and all orders for this visit:  Chronic pain syndrome  Long term current use of opiate analgesic  Musculoskeletal pain  Chronic left-sided low back pain with left-sided sciatica  DDD (degenerative disc disease), lumbar  Spondylosis of lumbar region without myelopathy or radiculopathy  Facet arthritis of lumbar region  Neurogenic pain  Lumbar spondylosis with myelopathy  Other orders -     triamcinolone acetonide (KENALOG-40) injection 40 mg -     sodium chloride flush (NS) 0.9 % injection 10 mL -     ropivacaine (PF) 2 mg/mL (0.2%) (NAROPIN) injection 10 mL -     lidocaine (PF) (XYLOCAINE) 1 % injection 5 mL -     iohexol (OMNIPAQUE) 180 MG/ML injection 10 mL -     oxyCODONE ER (XTAMPZA ER) 36 MG C12A; Take 2 capsules (72 mg total) by mouth 2 (two) times daily. -     oxyCODONE ER (XTAMPZA ER) 36 MG C12A; Take 2 capsules (72 mg total) by mouth 2 (two) times daily.        ----------------------------------------------------------------------------------------------------------------------  Problem List Items Addressed This Visit      Unprioritized   Chronic low back pain (Chronic)   Relevant Medications   oxyCODONE ER (XTAMPZA ER) 36 MG C12A (Start on 03/09/2020)   oxyCODONE ER (XTAMPZA ER) 36 MG C12A (Start on 04/08/2020)   Chronic pain -  Primary (Chronic)   Relevant Medications   oxyCODONE ER (XTAMPZA ER) 36 MG C12A (Start on 03/09/2020)   oxyCODONE ER (XTAMPZA ER) 36 MG C12A (Start on 04/08/2020)   DDD (degenerative disc disease), lumbar   Relevant Medications   oxyCODONE ER (XTAMPZA ER) 36 MG C12A (Start on 03/09/2020)   oxyCODONE ER (XTAMPZA ER) 36 MG C12A (Start on 04/08/2020)   Long term current use of opiate analgesic (Chronic)   Musculoskeletal pain (Chronic)   Neurogenic pain (Chronic)   Spondylosis of lumbar region without myelopathy or radiculopathy   Relevant Medications   oxyCODONE ER (XTAMPZA ER) 36 MG C12A (Start on 03/09/2020)  oxyCODONE ER (XTAMPZA ER) 36 MG C12A (Start on 04/08/2020)    Other Visit Diagnoses    Facet arthritis of lumbar region       Relevant Medications   triamcinolone acetonide (KENALOG-40) injection 40 mg (Completed)   oxyCODONE ER (XTAMPZA ER) 36 MG C12A (Start on 03/09/2020)   oxyCODONE ER (XTAMPZA ER) 36 MG C12A (Start on 04/08/2020)   Lumbar spondylosis with myelopathy       Relevant Medications   triamcinolone acetonide (KENALOG-40) injection 40 mg (Completed)   oxyCODONE ER (XTAMPZA ER) 36 MG C12A (Start on 03/09/2020)   oxyCODONE ER (XTAMPZA ER) 36 MG C12A (Start on 04/08/2020)        ----------------------------------------------------------------------------------------------------------------------  1. Chronic pain syndrome Continue current medication management.  I will refill his medications today.  I have reviewed the [facility] practitioner database information and it is appropriate.  I have reviewed his UDS and it is okay.  Refills will  be given for August 14 and September 13  2. Long term current use of opiate analgesic As above  3. Musculoskeletal pain Continue core stretching strengthening  4. Chronic left-sided low back pain with left-sided sciatica Epidural today.  The risks and benefits of been reviewed and all questions answered.  Return to clinic in 2  months  5. DDD (degenerative disc disease), lumbar As above as above  6. Spondylosis of lumbar region without myelopathy or radiculopathy   7. Facet arthritis of lumbar region   8. Neurogenic pain   9. Lumbar spondylosis with myelopathy As above    ----------------------------------------------------------------------------------------------------------------------  I am having Peter Lamb start on [place] ER. I am also having him maintain his aspirin, metFORMIN, atenolol, diltiazem, Multiple Vitamins-Minerals (VITAMINS TO GO MEN PO), rosuvastatin, TraZODone HCl, glipiZIDE, nitroGLYCERIN, Fish Oil, VITAMIN E PO, Melatonin, glucosamine-chondroitin, tadalafil, tiZANidine, and Xtampza ER. We administered triamcinolone acetonide, sodium chloride flush, ropivacaine (PF) 2 mg/mL (0.2%), lidocaine (PF), and iohexol.   Meds ordered this encounter  Medications  . triamcinolone acetonide (KENALOG-40) injection 40 mg  . sodium chloride flush (NS) 0.9 % injection 10 mL  . ropivacaine (PF) 2 mg/mL (0.2%) (NAROPIN) injection 10 mL  . lidocaine (PF) (XYLOCAINE) 1 % injection 5 mL  . iohexol (OMNIPAQUE) 180 MG/ML injection 10 mL  . oxyCODONE ER (XTAMPZA ER) 36 MG C12A    Sig: Take 2 capsules (72 mg total) by mouth 2 (two) times daily.    Dispense:  120 capsule    Refill:  0  . oxyCODONE ER (XTAMPZA ER) 36 MG C12A    Sig: Take 2 capsules (72 mg total) by mouth 2 (two) times daily.    Dispense:  120 capsule    Refill:  0   Patient's Medications  New Prescriptions   OXYCODONE ER (XTAMPZA ER) 36 MG C12A    Take 2 capsules (72 mg total) by mouth 2 (two) times daily.  Previous Medications   ASPIRIN 81 MG TABLET    Take 162 mg by mouth daily.   ATENOLOL (TENORMIN) 100 MG TABLET    Take 50 mg by mouth 2 (two) times daily.   DILTIAZEM (DILTIAZEM CD) 240 MG 24 HR CAPSULE    Take 240 mg by mouth daily.   GLIPIZIDE (GLUCOTROL) 10 MG TABLET    Take 10 mg 2 (two) times daily by mouth.    GLUCOSAMINE-CHONDROITIN 500-400 MG TABLET    Take 1 tablet by mouth daily.   MELATONIN 10 MG TABS    Take by mouth at  bedtime as needed.   METFORMIN (GLUCOPHAGE) 500 MG TABLET    Take 500 mg by mouth daily with breakfast. Reported on 02/10/2016   MULTIPLE VITAMINS-MINERALS (VITAMINS TO GO MEN PO)    Take 1 tablet by mouth daily.    NITROGLYCERIN (NITROSTAT) 0.4 MG SL TABLET    Place 1 tablet under tongue as needed for chest pain (may repeat every 5 minutes but seek medical help if pain persists after 3 tablets) as needed   OMEGA-3 FATTY ACIDS (FISH OIL) 1000 MG CAPS    Take by mouth daily.   ROSUVASTATIN (CRESTOR) 10 MG TABLET    Take 10 mg by mouth daily.   TADALAFIL (CIALIS) 20 MG TABLET    Take 20 mg by mouth daily as needed.   TIZANIDINE (ZANAFLEX) 4 MG TABLET    TAKE 1 TABLET (4 MG TOTAL) BY MOUTH 2 (TWO) TIMES DAILY.   TRAZODONE HCL 150 MG TB24    Take by mouth at bedtime.   VITAMIN E PO    Take 90 Units by mouth daily.  Modified Medications   Modified Medication Previous Medication   OXYCODONE ER (XTAMPZA ER) 36 MG C12A oxyCODONE ER (XTAMPZA ER) 36 MG C12A      Take 2 capsules (72 mg total) by mouth 2 (two) times daily.    Take 2 capsules (72 mg total) by mouth 2 (two) times daily.  Discontinued Medications   No medications on file   ----------------------------------------------------------------------------------------------------------------------  Follow-up: Return in about 2 months (around 05/06/2020) for evaluation, med refill.   Procedure: L5-S1  LESI with fluoroscopic guidance and no moderate sedation  NOTE: The risks, benefits, and expectations of the procedure have been discussed and explained to the patient who was understanding and in agreement with suggested treatment plan. No guarantees were made.  DESCRIPTION OF PROCEDURE: Lumbar epidural steroid injection with no IV Versed, EKG, blood pressure, pulse, and pulse oximetry monitoring. The procedure was performed with the  patient in the prone position under fluoroscopic guidance.  Sterile prep x3 was initiated and I then injected subcutaneous lidocaine to the overlying L5-S1 site after its fluoroscopic identifictation.  Using strict aseptic technique, I then advanced an 18-gauge Tuohy epidural needle in the midline using interlaminar approach via loss-of-resistance to saline technique. There was negative aspiration for heme or  CSF.  I then confirmed position with both AP and Lateral fluoroscan.  2 cc of contrast dye were injected and a  total of 5 mL of Preservative-Free normal saline mixed with 40 mg of Kenalog and 1cc Ropicaine 0.2 percent were injected incrementally via the  epidurally placed needle. The needle was removed. The patient tolerated the injection well and was convalesced and discharged to home in stable condition. Should the patient have any post procedure difficulty they have been instructed on how to contact us for assistance.    Molli Barrows, MD

## 2020-03-07 ENCOUNTER — Other Ambulatory Visit: Payer: Self-pay

## 2020-03-07 ENCOUNTER — Telehealth: Payer: Self-pay

## 2020-03-07 DIAGNOSIS — G894 Chronic pain syndrome: Secondary | ICD-10-CM

## 2020-03-07 NOTE — Telephone Encounter (Signed)
St procedure phone call.  LM

## 2020-03-11 LAB — TOXASSURE SELECT 13 (MW), URINE

## 2020-03-13 ENCOUNTER — Ambulatory Visit: Payer: Medicare Other | Admitting: Orthotics

## 2020-03-13 ENCOUNTER — Other Ambulatory Visit: Payer: Self-pay

## 2020-03-13 DIAGNOSIS — M2042 Other hammer toe(s) (acquired), left foot: Secondary | ICD-10-CM

## 2020-03-13 DIAGNOSIS — M2041 Other hammer toe(s) (acquired), right foot: Secondary | ICD-10-CM

## 2020-03-13 NOTE — Progress Notes (Signed)

## 2020-04-03 ENCOUNTER — Other Ambulatory Visit: Payer: Self-pay

## 2020-04-03 ENCOUNTER — Ambulatory Visit: Payer: Medicare Other | Admitting: Dermatology

## 2020-04-03 DIAGNOSIS — L578 Other skin changes due to chronic exposure to nonionizing radiation: Secondary | ICD-10-CM

## 2020-04-03 DIAGNOSIS — L570 Actinic keratosis: Secondary | ICD-10-CM | POA: Diagnosis not present

## 2020-04-03 DIAGNOSIS — L820 Inflamed seborrheic keratosis: Secondary | ICD-10-CM | POA: Diagnosis not present

## 2020-04-03 DIAGNOSIS — D692 Other nonthrombocytopenic purpura: Secondary | ICD-10-CM

## 2020-04-03 NOTE — Progress Notes (Signed)
   Follow-Up Visit   Subjective  Peter Lamb is a 64 y.o. male who presents for the following: Actinic Keratosis (recheck sun exposed areas for new or persistent skin lesions ).  The following portions of the chart were reviewed this encounter and updated as appropriate:  Tobacco  Allergies  Meds  Problems  Med Hx  Surg Hx  Fam Hx     Review of Systems:  No other skin or systemic complaints except as noted in HPI or Assessment and Plan.  Objective  Well appearing patient in no apparent distress; mood and affect are within normal limits.  A focused examination was performed including the face, scalp, and arms. Relevant physical exam findings are noted in the Assessment and Plan.  Objective  scalp x (4): Erythematous thin papules/macules with gritty scale.   Objective  B/L arms and hands x 8 (8): Erythematous keratotic or waxy stuck-on papule or plaque.   Assessment & Plan  AK (actinic keratosis) (4) scalp x  Destruction of lesion - scalp x Complexity: simple   Destruction method: cryotherapy   Informed consent: discussed and consent obtained   Timeout:  patient name, date of birth, surgical site, and procedure verified Lesion destroyed using liquid nitrogen: Yes   Region frozen until ice ball extended beyond lesion: Yes   Outcome: patient tolerated procedure well with no complications   Post-procedure details: wound care instructions given    Inflamed seborrheic keratosis (8) B/L arms and hands x 8  Destruction of lesion - B/L arms and hands x 8 Complexity: simple   Destruction method: cryotherapy   Informed consent: discussed and consent obtained   Timeout:  patient name, date of birth, surgical site, and procedure verified Lesion destroyed using liquid nitrogen: Yes   Region frozen until ice ball extended beyond lesion: Yes   Outcome: patient tolerated procedure well with no complications   Post-procedure details: wound care instructions given      Purpura on the face - from having tooth extracted - Violaceous macules and patches - Benign - Related to age, sun damage and/or use of blood thinners - Observe - Can use OTC arnica containing moisturizer such as Dermend Bruise Formula if desired - Call for worsening or other concerns  Actinic Damage - diffuse scaly erythematous macules with underlying dyspigmentation - Recommend daily broad spectrum sunscreen SPF 30+ to sun-exposed areas, reapply every 2 hours as needed.  - Call for new or changing lesions.  Return in about 6 months (around 10/01/2020).  Luther Redo, CMA, am acting as scribe for Sarina Ser, MD .  Documentation: I have reviewed the above documentation for accuracy and completeness, and I agree with the above.  Sarina Ser, MD

## 2020-04-04 ENCOUNTER — Encounter: Payer: Self-pay | Admitting: Dermatology

## 2020-04-23 ENCOUNTER — Other Ambulatory Visit: Payer: Self-pay | Admitting: Unknown Physician Specialty

## 2020-04-23 DIAGNOSIS — H903 Sensorineural hearing loss, bilateral: Secondary | ICD-10-CM

## 2020-04-26 ENCOUNTER — Ambulatory Visit: Payer: Medicare Other | Attending: Anesthesiology | Admitting: Anesthesiology

## 2020-04-26 ENCOUNTER — Other Ambulatory Visit: Payer: Self-pay

## 2020-04-26 DIAGNOSIS — M4716 Other spondylosis with myelopathy, lumbar region: Secondary | ICD-10-CM

## 2020-04-26 DIAGNOSIS — M47816 Spondylosis without myelopathy or radiculopathy, lumbar region: Secondary | ICD-10-CM

## 2020-04-26 DIAGNOSIS — G894 Chronic pain syndrome: Secondary | ICD-10-CM

## 2020-04-26 DIAGNOSIS — Z79891 Long term (current) use of opiate analgesic: Secondary | ICD-10-CM

## 2020-04-26 DIAGNOSIS — M7918 Myalgia, other site: Secondary | ICD-10-CM

## 2020-04-26 DIAGNOSIS — M5442 Lumbago with sciatica, left side: Secondary | ICD-10-CM

## 2020-04-26 DIAGNOSIS — M5387 Other specified dorsopathies, lumbosacral region: Secondary | ICD-10-CM

## 2020-04-26 DIAGNOSIS — M5136 Other intervertebral disc degeneration, lumbar region: Secondary | ICD-10-CM

## 2020-04-26 DIAGNOSIS — G8929 Other chronic pain: Secondary | ICD-10-CM

## 2020-04-26 IMAGING — MR MR CERVICAL SPINE W/O CM
5 series · 34 of 48 positions shown · non-contrast
Comparison: None.

CLINICAL DATA: Neck pain radiating to both upper extremities.

EXAM:
MRI CERVICAL SPINE WITHOUT CONTRAST
TECHNIQUE: Multiplanar, multisequence MR imaging of the cervical spine was
performed. No intravenous contrast was administered.

[Series 5: STIR · sagittal · 3.0mm · 0.35mm/px · 6 of 13 slices shown]
[im 1/13]
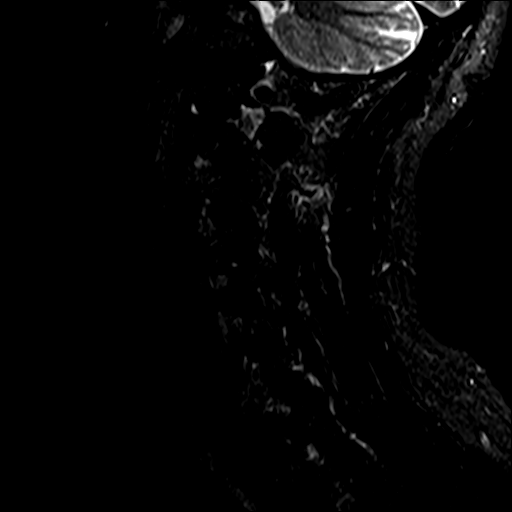
[im 3/13]
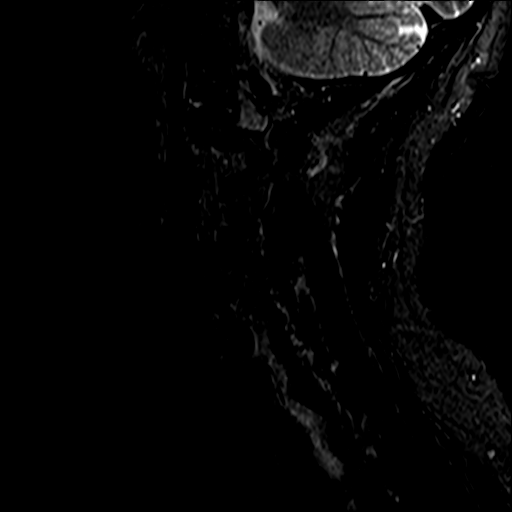
[im 5/13]
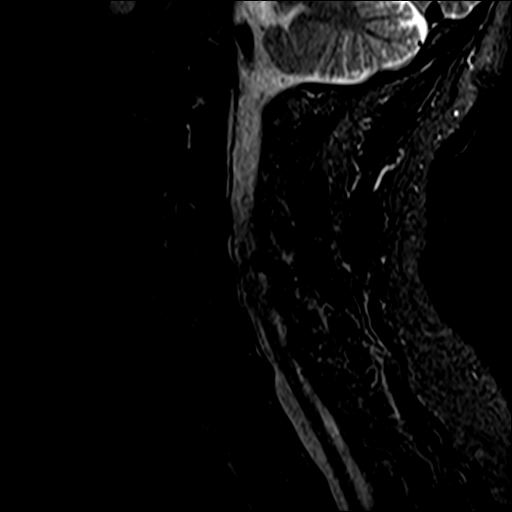
[im 8/13]
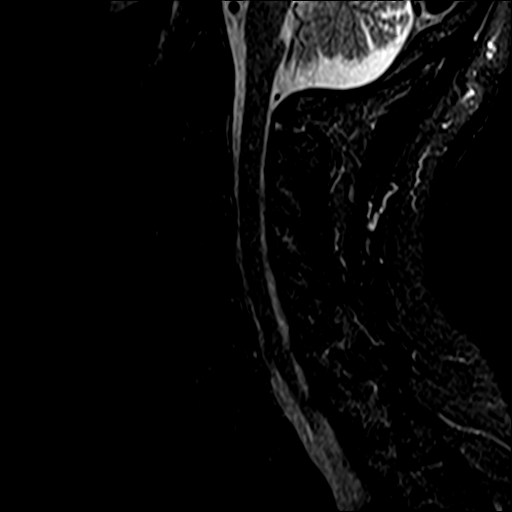
[im 10/13]
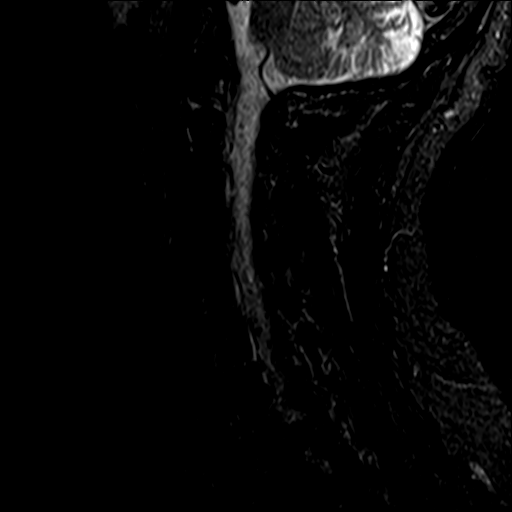
[im 13/13]
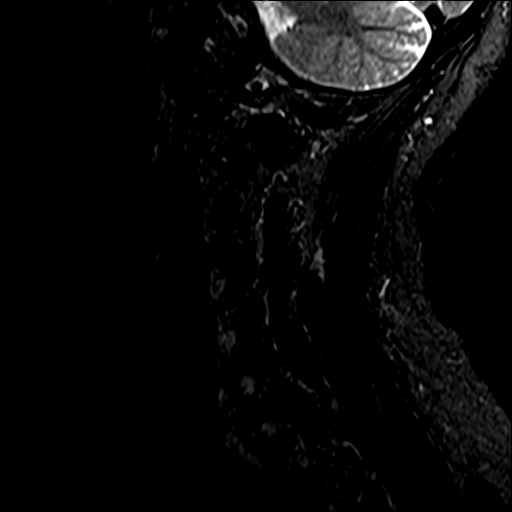

[Series 6: T2 · sagittal · 3.0mm · 0.70mm/px · 6 of 13 slices shown (1 of 2)]
[im 1/13]
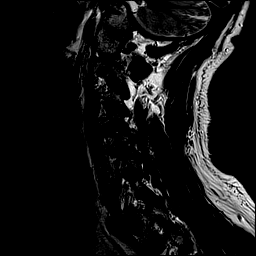
[im 3/13]
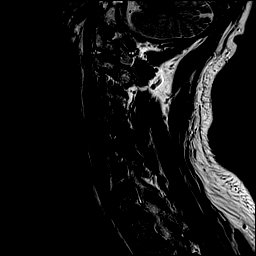
[im 5/13]
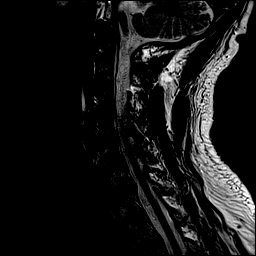
[im 8/13]
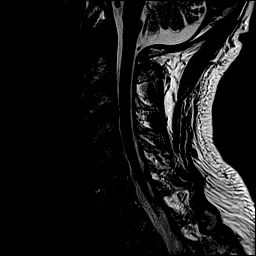
[im 10/13]
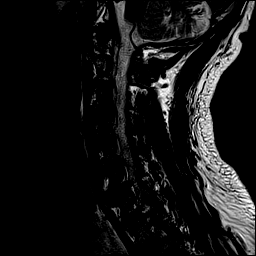
[im 13/13]
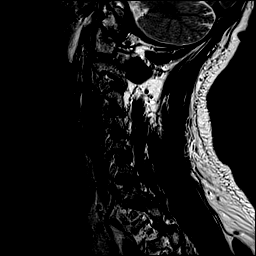

[Series 7: mpgr ax · axial · 3.0mm · 0.35mm/px · z∈[-63,+40]mm · 7 of 33 slices shown]
[im 1/33]
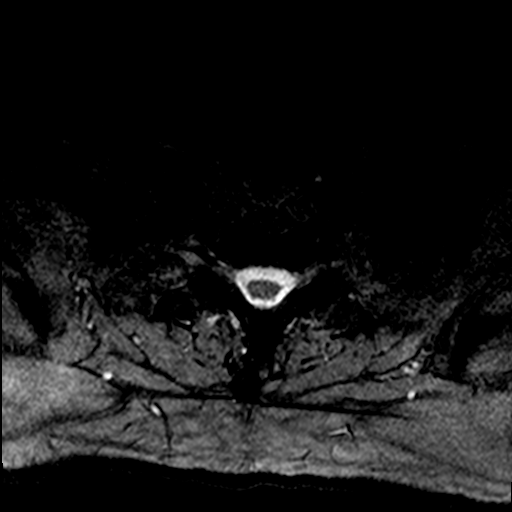
[im 5/33]
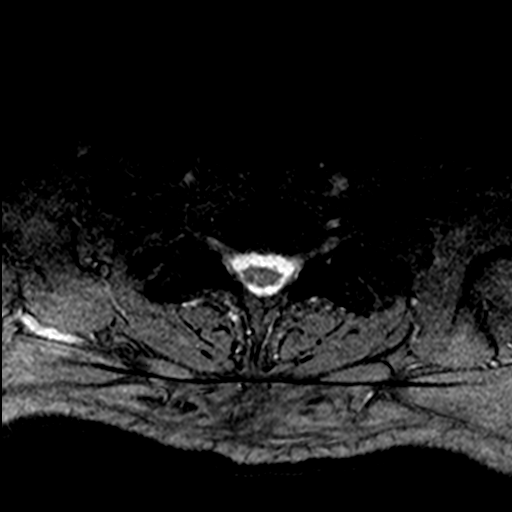
[im 10/33]
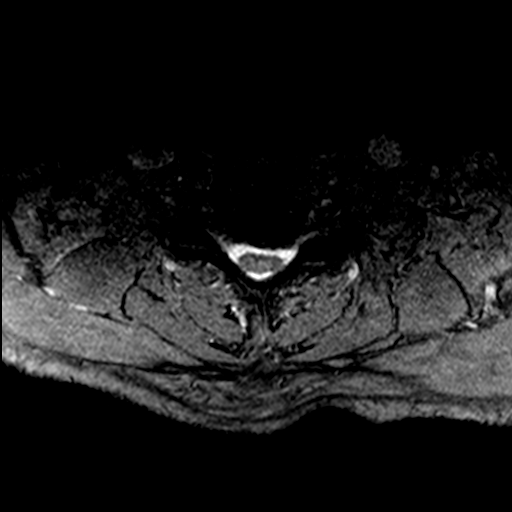
[im 14/33]
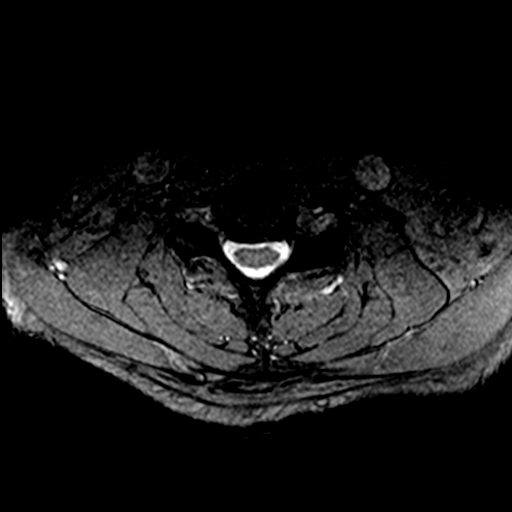
[im 19/33]
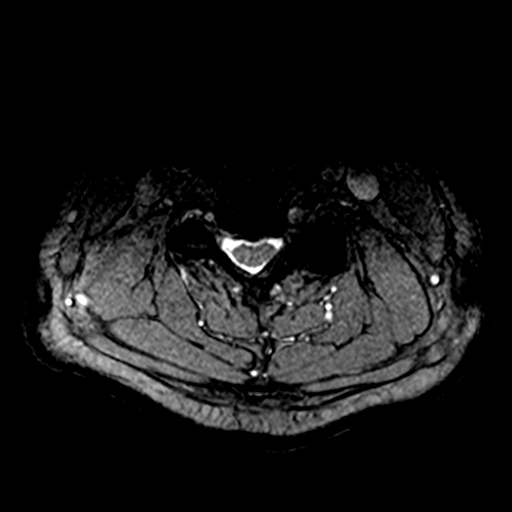
[im 23/33]
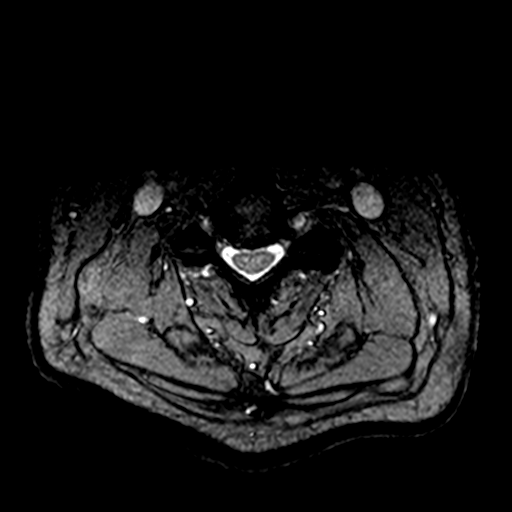
[im 28/33]
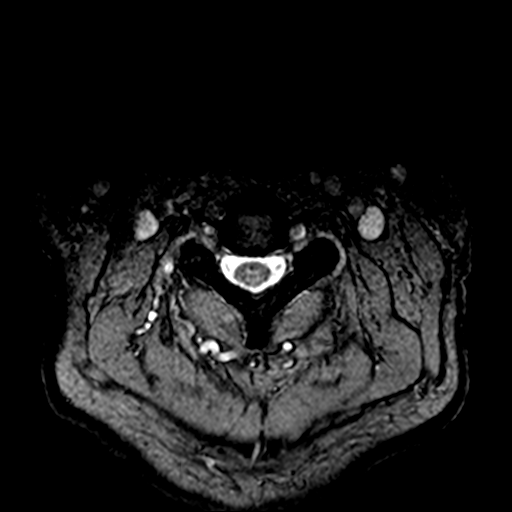

[Series 8: T2 · axial · 3.0mm · 0.70mm/px · z∈[-63,+59]mm · 9 of 33 slices shown (2 of 2)]
[im 1/33]
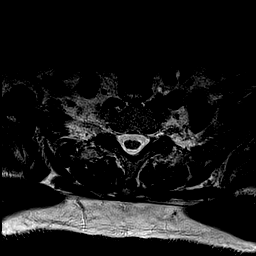
[im 5/33]
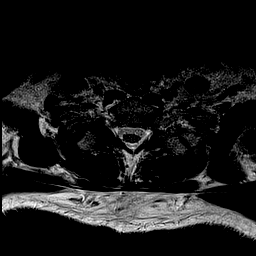
[im 10/33]
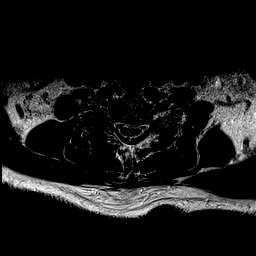
[im 14/33]
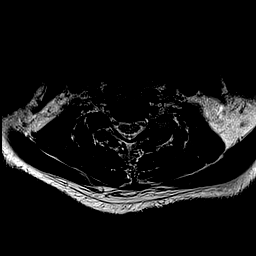
[im 17/33]
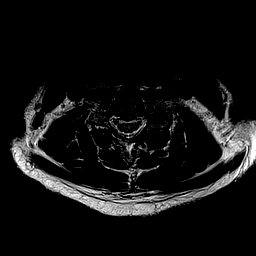
[im 19/33]
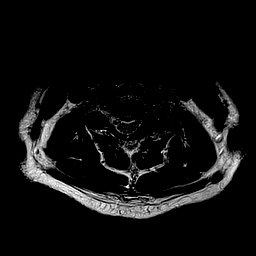
[im 23/33]
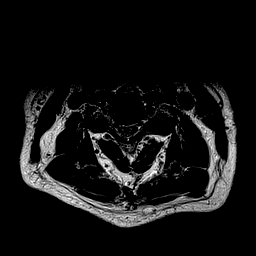
[im 28/33]
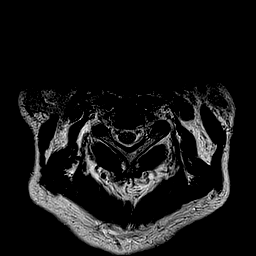
[im 33/33]
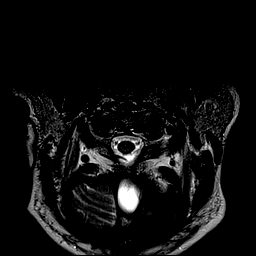

[Series 9: T1 · sagittal · 3.0mm · 0.70mm/px · 6 of 13 slices shown]
[im 1/13]
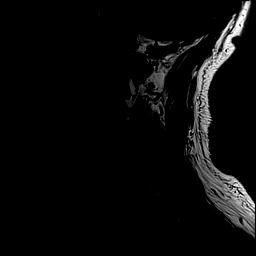
[im 3/13]
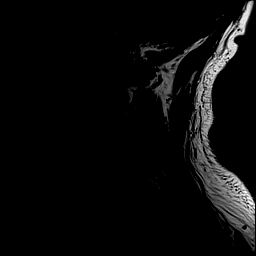
[im 5/13]
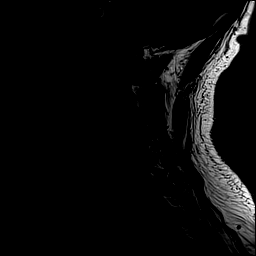
[im 8/13]
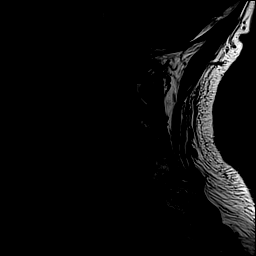
[im 10/13]
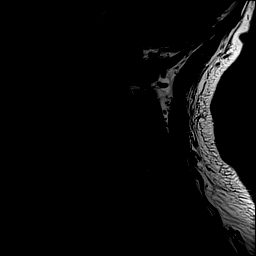
[im 13/13]
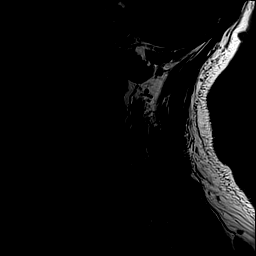

[34 of 48 positions shown; findings below may reference images not displayed]

FINDINGS: Alignment: Normal.

Vertebrae: No focal marrow lesion. No compression fracture or
evidence of discitis osteomyelitis.

Cord: Normal caliber and signal.

Posterior Fossa, vertebral arteries, paraspinal tissues: Visualized
posterior fossa is normal. Vertebral artery flow voids are
preserved. No prevertebral effusion.

Disc levels:

Mild C1-C2 degenerative hypertrophy.

The C2-T1 levels are described individually below.

C2-C3: Normal.

C3-C4: Right-sided osteophyte with small central disc protrusion.
Left facet hypertrophy. No spinal canal or neural foraminal
stenosis.

C4-C5: Left facet hypertrophy and minimal disc bulge without spinal
canal stenosis. Mild left foraminal stenosis.

C5-C6: Small disc bulge.  No stenosis.

C6-C7: Mild disc bulge, left eccentric. Mild left foraminal
narrowing. No spinal canal stenosis.

C7-T1: Normal.

T1-T2 is imaged in the sagittal plane only, with no visible disc
herniation or stenosis.
IMPRESSION: 1. Mild left neural foraminal stenosis at C4-C5 and C6-C7.
2. No spinal canal stenosis.

## 2020-04-28 ENCOUNTER — Encounter: Payer: Self-pay | Admitting: Anesthesiology

## 2020-04-28 MED ORDER — XTAMPZA ER 36 MG PO C12A: 2.0000 | EXTENDED_RELEASE_CAPSULE | Freq: Two times a day (BID) | ORAL | 0 refills | Status: AC

## 2020-04-28 MED ORDER — XTAMPZA ER 36 MG PO C12A: 2.0000 | EXTENDED_RELEASE_CAPSULE | Freq: Two times a day (BID) | ORAL | 0 refills | Status: DC

## 2020-04-28 NOTE — Progress Notes (Signed)
Virtual Visit via Telephone Note  I connected with Peter Lamb on 04/28/20 at  9:00 AM EDT by telephone and verified that I am speaking with the correct person using two identifiers.  Location: Patient: Home Provider: Pain control center   I discussed the limitations, risks, security and privacy concerns of performing an evaluation and management service by telephone and the availability of in person appointments. I also discussed with the patient that there may be a patient responsible charge related to this service. The patient expressed understanding and agreed to proceed.   History of Present Illness: I spoke with Peter Lamb via telephone as he was unable to do the video portion of the virtual conference and reports that his low back pain has been doing reasonably well.  He is still taking his Xtampza ER as prescribed 2 tablets 2 times a day and this continues to work well for him.  He is having some recurrence of his lower extremity pain is similar to what he has had previous [name] with sciatica bouts.  His last epidural was in August and prior to that in February.  He generally gets 75 to 80% relief of his lower extremity pain which is reportedly very severe and incapacitating and unfortunately has failed other more conservative therapy.  The relief from the epidurals generally last for approximately 2 months.  No other changes are reported in his lower extremity strength or function or bowel or bladder function.    Observations/Objective:  Current Outpatient Medications:  .  aspirin 81 MG tablet, Take 162 mg by mouth daily., Disp: , Rfl:  .  atenolol (TENORMIN) 100 MG tablet, Take 50 mg by mouth 2 (two) times daily., Disp: , Rfl:  .  diltiazem (DILTIAZEM CD) 240 MG 24 hr capsule, Take 240 mg by mouth daily., Disp: , Rfl:  .  glipiZIDE (GLUCOTROL) 10 MG tablet, Take 10 mg 2 (two) times daily by mouth., Disp: , Rfl: 3 .  glucosamine-chondroitin 500-400 MG tablet, Take 1 tablet by mouth  daily., Disp: , Rfl:  .  Melatonin 10 MG TABS, Take by mouth at bedtime as needed., Disp: , Rfl:  .  metFORMIN (GLUCOPHAGE) 500 MG tablet, Take 500 mg by mouth daily with breakfast. Reported on 02/10/2016, Disp: , Rfl:  .  Multiple Vitamins-Minerals (VITAMINS TO GO MEN PO), Take 1 tablet by mouth daily. , Disp: , Rfl:  .  nitroGLYCERIN (NITROSTAT) 0.4 MG SL tablet, Place 1 tablet under tongue as needed for chest pain (may repeat every 5 minutes but seek medical help if pain persists after 3 tablets) as needed, Disp: , Rfl:  .  Omega-3 Fatty Acids (FISH OIL) 1000 MG CAPS, Take by mouth daily., Disp: , Rfl:  .  [START ON 05/07/2020] oxyCODONE ER (XTAMPZA ER) 36 MG C12A, Take 2 capsules (72 mg total) by mouth 2 (two) times daily., Disp: 120 capsule, Rfl: 0 .  [START ON 06/06/2020] oxyCODONE ER (XTAMPZA ER) 36 MG C12A, Take 2 capsules (72 mg total) by mouth 2 (two) times daily., Disp: 120 capsule, Rfl: 0 .  rosuvastatin (CRESTOR) 10 MG tablet, Take 10 mg by mouth daily., Disp: , Rfl:  .  tadalafil (CIALIS) 20 MG tablet, Take 20 mg by mouth daily as needed., Disp: , Rfl:  .  tiZANidine (ZANAFLEX) 4 MG tablet, TAKE 1 TABLET (4 MG TOTAL) BY MOUTH 2 (TWO) TIMES DAILY., Disp: 180 tablet, Rfl: 1 .  TraZODone HCl 150 MG TB24, Take by mouth at bedtime., Disp: ,  Rfl:  .  VITAMIN E PO, Take 90 Units by mouth daily., Disp: , Rfl:   Assessment and Plan: 1. Chronic pain syndrome   2. Long term current use of opiate analgesic   3. Musculoskeletal pain   4. Chronic left-sided low back pain with left-sided sciatica   5. DDD (degenerative disc disease), lumbar   6. Spondylosis of lumbar region without myelopathy or radiculopathy   7. Facet arthritis of lumbar region   8. Lumbar spondylosis with myelopathy   9. Sciatica of left side associated with disorder of lumbosacral spine   Based on our discussion today and upon review of the [facility] practitioner database information we will refill his medications for  the next 2 months.  This will be dated for October 12 and November 11.  I want him to continue with efforts at stretching strengthening and weight loss as previously reviewed.  No other changes will be initiated in regards to his pain management protocol.  We will schedule him for return to clinic in 2 months with an epidural at that time.  I also encouraged him to continue follow-up with his primary care physicians for his baseline medical care.  Follow Up Instructions:    I discussed the assessment and treatment plan with the patient. The patient was provided an opportunity to ask questions and all were answered. The patient agreed with the plan and demonstrated an understanding of the instructions.   The patient was advised to call back or seek an in-person evaluation if the symptoms worsen or if the condition fails to improve as anticipated.  I provided 30 minutes of non-face-to-face time during this encounter.   Molli Barrows, MD

## 2020-05-06 ENCOUNTER — Ambulatory Visit: Payer: Medicare Other

## 2020-07-04 ENCOUNTER — Encounter: Payer: Self-pay | Admitting: Anesthesiology

## 2020-07-04 ENCOUNTER — Ambulatory Visit
Admission: RE | Admit: 2020-07-04 | Discharge: 2020-07-04 | Disposition: A | Discharge status: Home / Self Care | Payer: Medicare Other | Source: Ambulatory Visit | Attending: Anesthesiology | Admitting: Anesthesiology

## 2020-07-04 ENCOUNTER — Other Ambulatory Visit: Payer: Self-pay

## 2020-07-04 ENCOUNTER — Other Ambulatory Visit: Payer: Self-pay | Admitting: Anesthesiology

## 2020-07-04 ENCOUNTER — Ambulatory Visit (HOSPITAL_BASED_OUTPATIENT_CLINIC_OR_DEPARTMENT_OTHER): Payer: Medicare Other | Admitting: Anesthesiology

## 2020-07-04 VITALS — BP 141/91 | HR 48 | Temp 97.3°F | Resp 18 | Ht 70.0 in | Wt 218.0 lb

## 2020-07-04 DIAGNOSIS — G894 Chronic pain syndrome: Secondary | ICD-10-CM | POA: Insufficient documentation

## 2020-07-04 DIAGNOSIS — M5136 Other intervertebral disc degeneration, lumbar region: Secondary | ICD-10-CM | POA: Insufficient documentation

## 2020-07-04 DIAGNOSIS — M47816 Spondylosis without myelopathy or radiculopathy, lumbar region: Secondary | ICD-10-CM | POA: Diagnosis present

## 2020-07-04 DIAGNOSIS — M5442 Lumbago with sciatica, left side: Secondary | ICD-10-CM

## 2020-07-04 DIAGNOSIS — M7918 Myalgia, other site: Secondary | ICD-10-CM | POA: Insufficient documentation

## 2020-07-04 DIAGNOSIS — M5387 Other specified dorsopathies, lumbosacral region: Secondary | ICD-10-CM | POA: Diagnosis present

## 2020-07-04 DIAGNOSIS — R52 Pain, unspecified: Secondary | ICD-10-CM

## 2020-07-04 DIAGNOSIS — Z79891 Long term (current) use of opiate analgesic: Secondary | ICD-10-CM | POA: Insufficient documentation

## 2020-07-04 DIAGNOSIS — M4716 Other spondylosis with myelopathy, lumbar region: Secondary | ICD-10-CM | POA: Diagnosis present

## 2020-07-04 DIAGNOSIS — G8929 Other chronic pain: Secondary | ICD-10-CM | POA: Diagnosis present

## 2020-07-04 MED ORDER — XTAMPZA ER 36 MG PO C12A: 2.0000 | EXTENDED_RELEASE_CAPSULE | Freq: Two times a day (BID) | ORAL | 0 refills | Status: AC

## 2020-07-04 MED ORDER — SODIUM CHLORIDE 0.9% FLUSH
10.0000 mL | Freq: Once | INTRAVENOUS | Status: AC
  Administered 2020-07-04: 10 mL

## 2020-07-04 MED ORDER — XTAMPZA ER 36 MG PO C12A
2.0000 | EXTENDED_RELEASE_CAPSULE | Freq: Two times a day (BID) | ORAL | 0 refills | Status: AC
Start: 2020-08-05 — End: 2020-09-04

## 2020-07-04 MED ORDER — IOHEXOL 180 MG/ML  SOLN
10.0000 mL | Freq: Once | INTRAMUSCULAR | Status: AC | PRN
  Administered 2020-07-04: 10 mL via EPIDURAL

## 2020-07-04 MED ORDER — TRIAMCINOLONE ACETONIDE 40 MG/ML IJ SUSP
40.0000 mg | Freq: Once | INTRAMUSCULAR | Status: AC
  Administered 2020-07-04: 40 mg
  Filled 2020-07-04: qty 1

## 2020-07-04 MED ORDER — ROPIVACAINE HCL 2 MG/ML IJ SOLN
10.0000 mL | Freq: Once | INTRAMUSCULAR | Status: AC
  Administered 2020-07-04: 10 mL via EPIDURAL
  Filled 2020-07-04: qty 10

## 2020-07-04 MED ORDER — LIDOCAINE HCL (PF) 1 % IJ SOLN
5.0000 mL | Freq: Once | INTRAMUSCULAR | Status: AC
  Administered 2020-07-04: 5 mL via SUBCUTANEOUS
  Filled 2020-07-04: qty 5

## 2020-07-04 NOTE — Progress Notes (Signed)
Subjective:  Patient ID: Peter Peter, male    DOB: [birth year]  Age: 64 y.o. MRN: [id number]  CC: Back Pain (Lower left.)   Procedure: L5-S1 epidural steroid under fluoroscopic guidance with no sedation  HPI Peter Peter presents for reevaluation.  He continues to have left lower extremity sciatica symptoms.  He previously had epidurals back in February and August and generally reports 75 to 80% relief sometimes 100% lasting 2 to 3 months.  He averages approximately 3 epidurals per year which keeps his pain under good control.  Unfortunately he has failed more conservative therapy and presents today with recurrent sciatica and low back pain.  No change in lower extremity strength or function or bowel or bladder function is noted the pain is primarily an aching gnawing pain worse with prolonged standing and activity and bending motion that radiates into the left calf.  He still taking his opioid medications as prescribed 2 tablets twice a day which continue to give him good relief with generalized pain management and are without side effects.  Outpatient Medications Prior to Visit  Medication Sig Dispense Refill  . aspirin 81 MG tablet Take 162 mg by mouth daily.    Marland Kitchen atenolol (TENORMIN) 100 MG tablet Take 50 mg by mouth 2 (two) times daily.    Marland Kitchen diltiazem (CARDIZEM CD) 240 MG 24 hr capsule Take 240 mg by mouth daily.    Marland Kitchen glipiZIDE (GLUCOTROL) 10 MG tablet Take 10 mg 2 (two) times daily by mouth.  3  . glucosamine-chondroitin 500-400 MG tablet Take 1 tablet by mouth daily.    . Melatonin 10 MG TABS Take by mouth at bedtime as needed.    . metFORMIN (GLUCOPHAGE) 500 MG tablet Take 500 mg by mouth daily with breakfast. Reported on 02/10/2016    . Multiple Vitamins-Minerals (VITAMINS TO GO MEN PO) Take 1 tablet by mouth daily.     . nitroGLYCERIN (NITROSTAT) 0.4 MG SL tablet Place 1 tablet under tongue as needed for chest pain (may repeat every 5 minutes but seek medical help if pain  persists after 3 tablets) as needed    . Omega-3 Fatty Acids (FISH OIL) 1000 MG CAPS Take by mouth daily.    . rosuvastatin (CRESTOR) 10 MG tablet Take 10 mg by mouth daily.    . tadalafil (CIALIS) 20 MG tablet Take 20 mg by mouth daily as needed.    . TraZODone HCl 150 MG TB24 Take by mouth at bedtime.    Marland Kitchen VITAMIN E PO Take 90 Units by mouth daily.    Marland Kitchen tiZANidine (ZANAFLEX) 4 MG tablet TAKE 1 TABLET (4 MG TOTAL) BY MOUTH 2 (TWO) TIMES DAILY. 180 tablet 1  . oxyCODONE Peter (XTAMPZA Peter) 36 MG C12A Take 2 capsules (72 mg total) by mouth 2 (two) times daily. 120 capsule 0   No facility-administered medications prior to visit.    Review of Systems CNS: No confusion or sedation Cardiac: No angina or palpitations GI: No abdominal pain or constipation Constitutional: No nausea vomiting fevers or chills  Objective:  BP (!) 141/91   Pulse (!) 48   Temp (!) 97.3 F (36.3 C) (Temporal)   Resp 18   Ht 5\' 10"  (1.778 m)   Wt 218 lb (98.9 kg)   SpO2 95%   BMI 31.28 kg/m    BP Readings from Last 3 Encounters:  07/04/20 (!) 141/91  03/06/20 (!) 155/105  09/07/19 (!) 147/90     Wt Readings from Last  3 Encounters:  07/04/20 218 lb (98.9 kg)  03/06/20 210 lb (95.3 kg)  09/07/19 214 lb (97.1 kg)     Physical Exam Pt is alert and oriented PERRL EOMI HEART IS RRR no murmur or rub LCTA no wheezing or rales MUSCULOSKELETAL reveals some paraspinous muscle tenderness but no overt trigger points.  He has a positive straight leg raise on the left side negative on the right.  His muscle tone and bulk is at baseline.  Labs  Lab Results  Component Value Date   HGBA1C 9.0 03/04/2010   Lab Results  Component Value Date   CREATININE 0.92 08/21/2016    -------------------------------------------------------------------------------------------------------------------- Lab Results  Component Value Date   WBC 9.2 08/21/2016   HGB 15.3 08/21/2016   HCT 43.6 08/21/2016   PLT 219  08/21/2016   GLUCOSE 132 (H) 08/21/2016   ALT 31 03/04/2010   AST 23 03/04/2010   NA 135 08/21/2016   K 3.7 08/21/2016   CL 100 (L) 08/21/2016   CREATININE 0.92 08/21/2016   BUN 11 08/21/2016   CO2 26 08/21/2016   PSA 0.26 03/04/2010   HGBA1C 9.0 03/04/2010    --------------------------------------------------------------------------------------------------------------------- No results found.   Assessment & Plan:   Peter Peter was seen today for back pain.  Diagnoses and all orders for this visit:  Chronic pain syndrome  Long term current use of opiate analgesic  Musculoskeletal pain  Chronic left-sided low back pain with left-sided sciatica  DDD (degenerative disc disease), lumbar  Spondylosis of lumbar region without myelopathy or radiculopathy  Facet arthritis of lumbar region  Lumbar spondylosis with myelopathy  Sciatica of left side associated with disorder of lumbosacral spine  Other orders -     oxyCODONE Peter (XTAMPZA Peter) 36 MG C12A; Take 2 capsules (72 mg total) by mouth 2 (two) times daily. -     oxyCODONE Peter (XTAMPZA Peter) 36 MG C12A; Take 2 capsules (72 mg total) by mouth 2 (two) times daily. -     triamcinolone acetonide (KENALOG-40) injection 40 mg -     sodium chloride flush (NS) 0.9 % injection 10 mL -     ropivacaine (PF) 2 mg/mL (0.2%) (NAROPIN) injection 10 mL -     lidocaine (PF) (XYLOCAINE) 1 % injection 5 mL -     iohexol (OMNIPAQUE) 180 MG/ML injection 10 mL        ----------------------------------------------------------------------------------------------------------------------  Problem List Items Addressed This Visit      Unprioritized   Chronic low back pain (Chronic)   Relevant Medications   oxyCODONE Peter (XTAMPZA Peter) 36 MG C12A (Start on 07/06/2020)   oxyCODONE Peter (XTAMPZA Peter) 36 MG C12A (Start on 08/05/2020)   Chronic pain - Primary (Chronic)   Relevant Medications   oxyCODONE Peter (XTAMPZA Peter) 36 MG C12A (Start on 07/06/2020)    oxyCODONE Peter (XTAMPZA Peter) 36 MG C12A (Start on 08/05/2020)   DDD (degenerative disc disease), lumbar   Relevant Medications   oxyCODONE Peter (XTAMPZA Peter) 36 MG C12A (Start on 07/06/2020)   oxyCODONE Peter (XTAMPZA Peter) 36 MG C12A (Start on 08/05/2020)   Long term current use of opiate analgesic (Chronic)   Musculoskeletal pain (Chronic)   Sciatica of left side associated with disorder of lumbosacral spine   Spondylosis of lumbar region without myelopathy or radiculopathy   Relevant Medications   oxyCODONE Peter (XTAMPZA Peter) 36 MG C12A (Start on 07/06/2020)   oxyCODONE Peter (XTAMPZA Peter) 36 MG C12A (Start on 08/05/2020)    Other Visit Diagnoses  Facet arthritis of lumbar region       Relevant Medications   oxyCODONE Peter (XTAMPZA Peter) 36 MG C12A (Start on 07/06/2020)   oxyCODONE Peter (XTAMPZA Peter) 36 MG C12A (Start on 08/05/2020)   triamcinolone acetonide (KENALOG-40) injection 40 mg (Completed)   Lumbar spondylosis with myelopathy       Relevant Medications   oxyCODONE Peter (XTAMPZA Peter) 36 MG C12A (Start on 07/06/2020)   oxyCODONE Peter (XTAMPZA Peter) 36 MG C12A (Start on 08/05/2020)   triamcinolone acetonide (KENALOG-40) injection 40 mg (Completed)        ----------------------------------------------------------------------------------------------------------------------  1. Chronic pain syndrome I have reviewed the [facility] practitioner database information and it is appropriate for refill today.  Is a be dated for December 11 and January 10 with return to clinic scheduled in 2 months.  2. Long term current use of opiate analgesic As above  3. Musculoskeletal pain Continue core stretching strengthening exercises as reviewed today.  4. Chronic left-sided low back pain with left-sided sciatica We will proceed with a repeat epidural today.  The risks and benefits of been reviewed in full detail and all questions answered.  5. DDD (degenerative disc disease), lumbar As above  6. Spondylosis of  lumbar region without myelopathy or radiculopathy As above  7. Facet arthritis of lumbar region As above  8. Lumbar spondylosis with myelopathy   9. Sciatica of left side associated with disorder of lumbosacral spine As above    ----------------------------------------------------------------------------------------------------------------------  I am having Peter Peter start on Peter Peter. I am also having him maintain his aspirin, metFORMIN, atenolol, diltiazem, Multiple Vitamins-Minerals (VITAMINS TO GO MEN PO), rosuvastatin, TraZODone HCl, glipiZIDE, nitroGLYCERIN, Fish Oil, VITAMIN E PO, Melatonin, glucosamine-chondroitin, tadalafil, tiZANidine, and Xtampza Peter. We administered triamcinolone acetonide, sodium chloride flush, ropivacaine (PF) 2 mg/mL (0.2%), lidocaine (PF), and iohexol.   Meds ordered this encounter  Medications  . oxyCODONE Peter (XTAMPZA Peter) 36 MG C12A    Sig: Take 2 capsules (72 mg total) by mouth 2 (two) times daily.    Dispense:  120 capsule    Refill:  0  . oxyCODONE Peter (XTAMPZA Peter) 36 MG C12A    Sig: Take 2 capsules (72 mg total) by mouth 2 (two) times daily.    Dispense:  120 capsule    Refill:  0  . triamcinolone acetonide (KENALOG-40) injection 40 mg  . sodium chloride flush (NS) 0.9 % injection 10 mL  . ropivacaine (PF) 2 mg/mL (0.2%) (NAROPIN) injection 10 mL  . lidocaine (PF) (XYLOCAINE) 1 % injection 5 mL  . iohexol (OMNIPAQUE) 180 MG/ML injection 10 mL   Patient's Medications  New Prescriptions   OXYCODONE Peter (XTAMPZA Peter) 36 MG C12A    Take 2 capsules (72 mg total) by mouth 2 (two) times daily.  Previous Medications   ASPIRIN 81 MG TABLET    Take 162 mg by mouth daily.   ATENOLOL (TENORMIN) 100 MG TABLET    Take 50 mg by mouth 2 (two) times daily.   DILTIAZEM (CARDIZEM CD) 240 MG 24 HR CAPSULE    Take 240 mg by mouth daily.   GLIPIZIDE (GLUCOTROL) 10 MG TABLET    Take 10 mg 2 (two) times daily by mouth.   GLUCOSAMINE-CHONDROITIN 500-400  MG TABLET    Take 1 tablet by mouth daily.   MELATONIN 10 MG TABS    Take by mouth at bedtime as needed.   METFORMIN (GLUCOPHAGE) 500 MG TABLET    Take 500 mg by  mouth daily with breakfast. Reported on 02/10/2016   MULTIPLE VITAMINS-MINERALS (VITAMINS TO GO MEN PO)    Take 1 tablet by mouth daily.    NITROGLYCERIN (NITROSTAT) 0.4 MG SL TABLET    Place 1 tablet under tongue as needed for chest pain (may repeat every 5 minutes but seek medical help if pain persists after 3 tablets) as needed   OMEGA-3 FATTY ACIDS (FISH OIL) 1000 MG CAPS    Take by mouth daily.   ROSUVASTATIN (CRESTOR) 10 MG TABLET    Take 10 mg by mouth daily.   TADALAFIL (CIALIS) 20 MG TABLET    Take 20 mg by mouth daily as needed.   TIZANIDINE (ZANAFLEX) 4 MG TABLET    TAKE 1 TABLET (4 MG TOTAL) BY MOUTH 2 (TWO) TIMES DAILY.   TRAZODONE HCL 150 MG TB24    Take by mouth at bedtime.   VITAMIN E PO    Take 90 Units by mouth daily.  Modified Medications   Modified Medication Previous Medication   OXYCODONE Peter (XTAMPZA Peter) 36 MG C12A oxyCODONE Peter (XTAMPZA Peter) 36 MG C12A      Take 2 capsules (72 mg total) by mouth 2 (two) times daily.    Take 2 capsules (72 mg total) by mouth 2 (two) times daily.  Discontinued Medications   No medications on file   ----------------------------------------------------------------------------------------------------------------------  Follow-up: Return in about 2 months (around 09/04/2020) for evaluation, med refill.   Procedure: L5-S1 LESI with fluoroscopic guidance and no no moderate sedation  NOTE: The risks, benefits, and expectations of the procedure have been discussed and explained to the patient who was understanding and in agreement with suggested treatment plan. No guarantees were made.  DESCRIPTION OF PROCEDURE: Lumbar epidural steroid injection with no IV Versed, EKG, blood pressure, pulse, and pulse oximetry monitoring. The procedure was performed with the patient in the prone position  under fluoroscopic guidance.  Sterile prep x3 was initiated and I then injected subcutaneous lidocaine to the overlying L5-S1 site after its fluoroscopic identifictation.  Using strict aseptic technique, I then advanced an 18-gauge Tuohy epidural needle in the midline using interlaminar approach via loss-of-resistance to saline technique. There was negative aspiration for heme or  CSF.  I then confirmed position with both AP and Lateral fluoroscan.  2 cc of contrast dye were injected and a  total of 5 mL of Preservative-Free normal saline mixed with 40 mg of Kenalog and 1cc Ropicaine 0.2 percent were injected incrementally via the  epidurally placed needle. The needle was removed. The patient tolerated the injection well and was convalesced and discharged to home in stable condition. Should the patient have any post procedure difficulty they have been instructed on how to contact us for assistance.    Molli Barrows, MD

## 2020-07-04 NOTE — Progress Notes (Signed)
Safety precautions to be maintained throughout the outpatient stay will include: orient to surroundings, keep bed in low position, maintain call bell within reach at all times, provide assistance with transfer out of bed and ambulation.  

## 2020-07-04 NOTE — Patient Instructions (Signed)
Pain Management Discharge Instructions  General Discharge Instructions :  If you need to reach your doctor call: Monday-Friday 8:00 am - 4:00 pm at [phone] or toll free [phone].  After clinic hours [phone] to have operator reach doctor.  Bring all of your medication bottles to all your appointments in the pain clinic.  To cancel or reschedule your appointment with Pain Management please remember to call 24 hours in advance to avoid a fee.  Refer to the educational materials which you have been given on: General Risks, I had my Procedure. Discharge Instructions, Post Sedation.  Post Procedure Instructions:  The drugs you were given will stay in your system until tomorrow, so for the next 24 hours you should not drive, make any legal decisions or drink any alcoholic beverages.  You may eat anything you prefer, but it is better to start with liquids then soups and crackers, and gradually work up to solid foods.  Please notify your doctor immediately if you have any unusual bleeding, trouble breathing or pain that is not related to your normal pain.  Depending on the type of procedure that was done, some parts of your body may feel week and/or numb.  This usually clears up by tonight or the next day.  Walk with the use of an assistive device or accompanied by an adult for the 24 hours.  You may use ice on the affected area for the first 24 hours.  Put ice in a Ziploc bag and cover with a towel and place against area 15 minutes on 15 minutes off.  You may switch to heat after 24 hours.Epidural Steroid Injection Patient Information  Description: The epidural space surrounds the nerves as they exit the spinal cord.  In some patients, the nerves can be compressed and inflamed by a bulging disc or a tight spinal canal (spinal stenosis).  By injecting steroids into the epidural space, we can bring irritated nerves into direct contact with a potentially helpful medication.  These  steroids act directly on the irritated nerves and can reduce swelling and inflammation which often leads to decreased pain.  Epidural steroids may be injected anywhere along the spine and from the neck to the low back depending upon the location of your pain.   After numbing the skin with local anesthetic (like Novocaine), a small needle is passed into the epidural space slowly.  You may experience a sensation of pressure while this is being done.  The entire block usually last less than 10 minutes.  Conditions which may be treated by epidural steroids:   Low back and leg pain  Neck and arm pain  Spinal stenosis  Post-laminectomy syndrome  Herpes zoster (shingles) pain  Pain from compression fractures  Preparation for the injection:  1. Do not eat any solid food or dairy products within 8 hours of your appointment.  2. You may drink clear liquids up to 3 hours before appointment.  Clear liquids include water, black coffee, juice or soda.  No milk or cream please. 3. You may take your regular medication, including pain medications, with a sip of water before your appointment  Diabetics should hold regular insulin (if taken separately) and take 1/2 normal NPH dos the morning of the procedure.  Carry some sugar containing items with you to your appointment. 4. A driver must accompany you and be prepared to drive you home after your procedure.  5. Bring all your current medications with your. 6. An IV may be inserted and   sedation may be given at the discretion of the physician.   7. A blood pressure cuff, EKG and other monitors will often be applied during the procedure.  Some patients may need to have extra oxygen administered for a short period. 8. You will be asked to provide medical information, including your allergies, prior to the procedure.  We must know immediately if you are taking blood thinners (like Coumadin/Warfarin)  Or if you are allergic to IV iodine contrast (dye). We must  know if you could possible be pregnant.  Possible side-effects:  Bleeding from needle site  Infection (rare, may require surgery)  Nerve injury (rare)  Numbness & tingling (temporary)  Difficulty urinating (rare, temporary)  Spinal headache ( a headache worse with upright posture)  Light -headedness (temporary)  Pain at injection site (several days)  Decreased blood pressure (temporary)  Weakness in arm/leg (temporary)  Pressure sensation in back/neck (temporary)  Call if you experience:  Fever/chills associated with headache or increased back/neck pain.  Headache worsened by an upright position.  New onset weakness or numbness of an extremity below the injection site  Hives or difficulty breathing (go to the emergency room)  Inflammation or drainage at the infection site  Severe back/neck pain  Any new symptoms which are concerning to you  Please note:  Although the local anesthetic injected can often make your back or neck feel good for several hours after the injection, the pain will likely return.  It takes 3-7 days for steroids to work in the epidural space.  You may not notice any pain relief for at least that one week.  If effective, we will often do a series of three injections spaced 3-6 weeks apart to maximally decrease your pain.  After the initial series, we generally will wait several months before considering a repeat injection of the same type.  If you have any questions, please call [phone] [facility] 

## 2020-07-05 ENCOUNTER — Telehealth: Payer: Self-pay | Admitting: *Deleted

## 2020-07-05 NOTE — Telephone Encounter (Signed)
Attempted to call for post procedure follow-up. Message left. 

## 2020-08-07 ENCOUNTER — Telehealth: Payer: Self-pay

## 2020-08-07 NOTE — Telephone Encounter (Signed)
Voicemail left on patient's phone, as I do not have the wife's number.  Let him know that his record indicates that he has a Rx to fill on 08/05/20.  If he is in the hospital, I wonder if they would be giving him his pain medication apart from his medication?  Asked to please call if he has additional questions.

## 2020-08-07 NOTE — Telephone Encounter (Signed)
His wife called and said he is in the [place] hospital with covid pneumonia. He has 36 pills left and wants to know what to do about his meds, since he cant come in. He has been in ICU 13 days.

## 2020-08-19 ENCOUNTER — Telehealth: Payer: Self-pay

## 2020-08-19 NOTE — Telephone Encounter (Signed)
HE was in the hospital for covid and his xtampza was supposed to filled and he couldn't do it. I asked him if he had called the pharmacy to see if they would still fill it and he had not. I suggested that he call the pharmacy to see if he could still fill it. He will do that and call back if he has any problems.

## 2020-08-30 ENCOUNTER — Telehealth: Payer: Self-pay | Admitting: Anesthesiology

## 2020-09-20 ENCOUNTER — Other Ambulatory Visit: Payer: Self-pay

## 2020-09-20 ENCOUNTER — Ambulatory Visit: Payer: Medicare Other | Attending: Anesthesiology | Admitting: Anesthesiology

## 2020-09-23 ENCOUNTER — Telehealth: Payer: Self-pay

## 2020-09-23 MED ORDER — XTAMPZA ER 36 MG PO C12A
2.0000 | EXTENDED_RELEASE_CAPSULE | Freq: Two times a day (BID) | ORAL | 0 refills | Status: DC
Start: 2020-09-23 — End: 2020-09-27

## 2020-09-23 NOTE — Telephone Encounter (Signed)
Dr. Andree Elk will be in the office tomorrow

## 2020-09-23 NOTE — Telephone Encounter (Signed)
Pt says that he is out of his medication. That he was suppose to have a VV with Dr.Adams on 09/20/20 and was not contacted. He says that he has been out for 2 plus weeks now an d can not wait until Friday for the next available appt. Also said to make you aware that he just got over covid pneumonia

## 2020-09-23 NOTE — Telephone Encounter (Signed)
  Dr. Andree Elk called in a 1 month supply of medication. The physician tried to call patient on the 25th and was unable to contact X 4.Patient called and it went straight to VM. VM left in detail explaining that 1 mo supply has been called in and that  PATIENT needs to call and make a VV appointment for Friday, March 4.

## 2020-09-27 ENCOUNTER — Other Ambulatory Visit: Payer: Self-pay

## 2020-09-27 ENCOUNTER — Encounter: Payer: Self-pay | Admitting: Anesthesiology

## 2020-09-27 ENCOUNTER — Ambulatory Visit: Payer: Medicare Other | Attending: Anesthesiology | Admitting: Anesthesiology

## 2020-09-27 DIAGNOSIS — M47816 Spondylosis without myelopathy or radiculopathy, lumbar region: Secondary | ICD-10-CM

## 2020-09-27 DIAGNOSIS — M5442 Lumbago with sciatica, left side: Secondary | ICD-10-CM | POA: Diagnosis not present

## 2020-09-27 DIAGNOSIS — Z79891 Long term (current) use of opiate analgesic: Secondary | ICD-10-CM | POA: Diagnosis not present

## 2020-09-27 DIAGNOSIS — M7918 Myalgia, other site: Secondary | ICD-10-CM

## 2020-09-27 DIAGNOSIS — G894 Chronic pain syndrome: Secondary | ICD-10-CM | POA: Diagnosis not present

## 2020-09-27 DIAGNOSIS — M5136 Other intervertebral disc degeneration, lumbar region: Secondary | ICD-10-CM

## 2020-09-27 DIAGNOSIS — G8929 Other chronic pain: Secondary | ICD-10-CM

## 2020-09-27 MED ORDER — XTAMPZA ER 36 MG PO C12A: 2.0000 | EXTENDED_RELEASE_CAPSULE | Freq: Two times a day (BID) | ORAL | 0 refills | Status: DC

## 2020-09-27 NOTE — Progress Notes (Signed)
Virtual Visit via Telephone Note  I connected with Peter Lamb on 09/27/20 at  1:50 PM EST by telephone and verified that I am speaking with the correct person using two identifiers.  Location: Patient: Home Provider: Pain control center   I discussed the limitations, risks, security and privacy concerns of performing an evaluation and management service by telephone and the availability of in person appointments. I also discussed with the patient that there may be a patient responsible charge related to this service. The patient expressed understanding and agreed to proceed.   History of Present Illness: I spoke with Peter Lamb via telephone as he was unable to link up for the video portion of the virtual conference.  He reports that he did well with his epidural back in December but over the last 2 to 3 weeks has had some more recurrence of left posterior lateral shooting type pain.  He states it feels like a periodic electricity goes to the left calf.  It is actually worse with lying down than when standing and supine.  He has noticed no lower extremity weakness or changes in bowel or bladder function.  Otherwise he has responded favorably to previous epidural steroid injections.  He still taking his Xtampza ER as prescribed and this continues to work well for him.  No side effects are reported and he gets good relief with the medication.  It generally gives him approximately 70 to 75% relief lasting several hours before he gets recurrence of the same pain.  He is taking this 2 tablets twice daily and this is working well for him with no side effects or untoward events.  He is try to do some stretching strengthening exercises as well to help with his low back pain and management   Observations/Objective: Marland Kitchen Current Outpatient Medications:  .  aspirin 81 MG tablet, Take 162 mg by mouth daily., Disp: , Rfl:  .  atenolol (TENORMIN) 100 MG tablet, Take 50 mg by mouth 2 (two) times daily.,  Disp: , Rfl:  .  diltiazem (CARDIZEM CD) 240 MG 24 hr capsule, Take 240 mg by mouth daily., Disp: , Rfl:  .  glipiZIDE (GLUCOTROL) 10 MG tablet, Take 10 mg 2 (two) times daily by mouth., Disp: , Rfl: 3 .  glucosamine-chondroitin 500-400 MG tablet, Take 1 tablet by mouth daily., Disp: , Rfl:  .  Melatonin 10 MG TABS, Take by mouth at bedtime as needed., Disp: , Rfl:  .  metFORMIN (GLUCOPHAGE) 500 MG tablet, Take 500 mg by mouth daily with breakfast. Reported on 02/10/2016, Disp: , Rfl:  .  Multiple Vitamins-Minerals (VITAMINS TO GO MEN PO), Take 1 tablet by mouth daily. , Disp: , Rfl:  .  nitroGLYCERIN (NITROSTAT) 0.4 MG SL tablet, Place 1 tablet under tongue as needed for chest pain (may repeat every 5 minutes but seek medical help if pain persists after 3 tablets) as needed, Disp: , Rfl:  .  Omega-3 Fatty Acids (FISH OIL) 1000 MG CAPS, Take by mouth daily., Disp: , Rfl:  .  [START ON 10/23/2020] oxyCODONE ER (XTAMPZA ER) 36 MG C12A, Take 2 capsules (72 mg total) by mouth 2 (two) times daily., Disp: 120 capsule, Rfl: 0 .  rosuvastatin (CRESTOR) 10 MG tablet, Take 10 mg by mouth daily., Disp: , Rfl:  .  tadalafil (CIALIS) 20 MG tablet, Take 20 mg by mouth daily as needed., Disp: , Rfl:  .  tiZANidine (ZANAFLEX) 4 MG tablet, TAKE 1 TABLET (4 MG TOTAL)  BY MOUTH 2 (TWO) TIMES DAILY., Disp: 180 tablet, Rfl: 1 .  TraZODone HCl 150 MG TB24, Take by mouth at bedtime., Disp: , Rfl:  .  VITAMIN E PO, Take 90 Units by mouth daily., Disp: , Rfl:   Assessment and Plan: 1. Chronic pain syndrome   2. Long term current use of opiate analgesic   3. Musculoskeletal pain   4. Chronic left-sided low back pain with left-sided sciatica   5. DDD (degenerative disc disease), lumbar   6. Spondylosis of lumbar region without myelopathy or radiculopathy   7. Facet arthritis of lumbar region   Based on our discussion today I have encouraged him to continue stretching strengthening exercises.  We will give this some time  to see if it settles down.  I want him to continue his current medication management and this is refilled for him.  He has a previous refill on 228 and another for March 30.  No other changes were made and we have reviewed the [facility] practitioner database information and it is appropriate.  Should the pain continue beyond the 4-week mark from today informed him to contact the pain control center and we will consider a repeat epidural to see if we can help with some of the neuralgic type pain he is describing.  Continue follow-up with his primary care physicians for his baseline medical care as well.  Follow Up Instructions:    I discussed the assessment and treatment plan with the patient. The patient was provided an opportunity to ask questions and all were answered. The patient agreed with the plan and demonstrated an understanding of the instructions.   The patient was advised to call back or seek an in-person evaluation if the symptoms worsen or if the condition fails to improve as anticipated.  I provided 30 minutes of non-face-to-face time during this encounter.   Molli Barrows, MD

## 2020-10-02 ENCOUNTER — Other Ambulatory Visit: Payer: Self-pay

## 2020-10-02 ENCOUNTER — Ambulatory Visit: Payer: Medicare Other | Admitting: Dermatology

## 2020-10-02 DIAGNOSIS — L820 Inflamed seborrheic keratosis: Secondary | ICD-10-CM | POA: Diagnosis not present

## 2020-10-02 DIAGNOSIS — L578 Other skin changes due to chronic exposure to nonionizing radiation: Secondary | ICD-10-CM | POA: Diagnosis not present

## 2020-10-02 DIAGNOSIS — L821 Other seborrheic keratosis: Secondary | ICD-10-CM | POA: Diagnosis not present

## 2020-10-02 DIAGNOSIS — L570 Actinic keratosis: Secondary | ICD-10-CM

## 2020-10-02 NOTE — Patient Instructions (Signed)

## 2020-10-02 NOTE — Progress Notes (Signed)
   Follow-Up Visit   Subjective  Peter Lamb is a 65 y.o. male who presents for the following: Actinic Keratosis (6 month follow up of scalp treated with LN2) and Follow-up (Inflamed SK follow up of arms/hands treated with LN2).  The following portions of the chart were reviewed this encounter and updated as appropriate:   Tobacco  Allergies  Meds  Problems  Med Hx  Surg Hx  Fam Hx     Review of Systems:  No other skin or systemic complaints except as noted in HPI or Assessment and Plan.  Objective  Well appearing patient in no apparent distress; mood and affect are within normal limits.  A focused examination was performed including scalp, face, arms/hands. Relevant physical exam findings are noted in the Assessment and Plan.  Objective  Scalp x 5, face x 2, left ear x 1 (8): Erythematous thin papules/macules with gritty scale.   Objective  Arms/hands (4): Erythematous keratotic or waxy stuck-on papule or plaque.    Assessment & Plan    Actinic Damage - chronic, secondary to cumulative UV radiation exposure/sun exposure over time - diffuse scaly erythematous macules with underlying dyspigmentation - Recommend daily broad spectrum sunscreen SPF 30+ to sun-exposed areas, reapply every 2 hours as needed.  - Recommend staying in the shade or wearing long sleeves, sun glasses (UVA+UVB protection) and wide brim hats (4-inch brim around the entire circumference of the hat). - Call for new or changing lesions.  Seborrheic Keratoses - Stuck-on, waxy, tan-brown papules and plaques  - Discussed benign etiology and prognosis. - Observe - Call for any changes  AK (actinic keratosis) (8) Scalp x 5, face x 2, left ear x 1  Destruction of lesion - Scalp x 5, face x 2, left ear x 1 Complexity: simple   Destruction method: cryotherapy   Informed consent: discussed and consent obtained   Timeout:  patient name, date of birth, surgical site, and procedure verified Lesion  destroyed using liquid nitrogen: Yes   Region frozen until ice ball extended beyond lesion: Yes   Outcome: patient tolerated procedure well with no complications   Post-procedure details: wound care instructions given    Inflamed seborrheic keratosis (4) Arms/hands  Destruction of lesion - Arms/hands Complexity: simple   Destruction method: cryotherapy   Informed consent: discussed and consent obtained   Timeout:  patient name, date of birth, surgical site, and procedure verified Lesion destroyed using liquid nitrogen: Yes   Region frozen until ice ball extended beyond lesion: Yes   Outcome: patient tolerated procedure well with no complications   Post-procedure details: wound care instructions given    Return in about 1 year (around 10/02/2021).  I, Ashok Cordia, CMA, am acting as scribe for Sarina Ser, MD .  Documentation: I have reviewed the above documentation for accuracy and completeness, and I agree with the above.  Sarina Ser, MD

## 2020-10-03 ENCOUNTER — Encounter: Payer: Self-pay | Admitting: Dermatology

## 2020-11-06 ENCOUNTER — Other Ambulatory Visit: Payer: Self-pay

## 2020-11-06 ENCOUNTER — Ambulatory Visit: Payer: Medicare Other | Attending: Anesthesiology | Admitting: Anesthesiology

## 2020-11-06 ENCOUNTER — Encounter: Payer: Self-pay | Admitting: Anesthesiology

## 2020-11-06 DIAGNOSIS — M5442 Lumbago with sciatica, left side: Secondary | ICD-10-CM | POA: Diagnosis not present

## 2020-11-06 DIAGNOSIS — G894 Chronic pain syndrome: Secondary | ICD-10-CM | POA: Diagnosis not present

## 2020-11-06 DIAGNOSIS — Z79891 Long term (current) use of opiate analgesic: Secondary | ICD-10-CM

## 2020-11-06 DIAGNOSIS — G8929 Other chronic pain: Secondary | ICD-10-CM

## 2020-11-06 DIAGNOSIS — M47816 Spondylosis without myelopathy or radiculopathy, lumbar region: Secondary | ICD-10-CM

## 2020-11-06 DIAGNOSIS — M5136 Other intervertebral disc degeneration, lumbar region: Secondary | ICD-10-CM

## 2020-11-06 DIAGNOSIS — M7918 Myalgia, other site: Secondary | ICD-10-CM | POA: Diagnosis not present

## 2020-11-06 DIAGNOSIS — M5387 Other specified dorsopathies, lumbosacral region: Secondary | ICD-10-CM

## 2020-11-06 DIAGNOSIS — M4716 Other spondylosis with myelopathy, lumbar region: Secondary | ICD-10-CM

## 2020-11-06 MED ORDER — XTAMPZA ER 36 MG PO C12A: 2.0000 | EXTENDED_RELEASE_CAPSULE | Freq: Two times a day (BID) | ORAL | 0 refills | Status: AC

## 2020-11-06 MED ORDER — XTAMPZA ER 36 MG PO C12A: 2.0000 | EXTENDED_RELEASE_CAPSULE | Freq: Two times a day (BID) | ORAL | 0 refills | Status: DC

## 2020-11-06 NOTE — Progress Notes (Signed)
Virtual Visit via Telephone Note  I connected with Peter Lamb on 11/06/20 at  1:35 PM EDT by telephone and verified that I am speaking with the correct person using two identifiers.  Location: Patient: Home Provider: Pain control center   I discussed the limitations, risks, security and privacy concerns of performing an evaluation and management service by telephone and the availability of in person appointments. I also discussed with the patient that there may be a patient responsible charge related to this service. The patient expressed understanding and agreed to proceed.   History of Present Illness: I spoke with Peter Lamb via telephone as he was unable to do the video portion of the virtual conference.  He reports that he is having a considerable amount of new onset sciatica.  His last epidural was back in December and he reported approximately 75% improvement lasting until about 3 weeks ago.  He has had recurrence of the same quality type sciatica symptoms that he is experienced in the past and had good results with epidural steroid injections.  No new changes in bowel or bladder function or lower extremity strength or function are noted.  Conservative therapy has been ineffective at keeping this under control and he has periodic epidurals to keep the pain maintained and under good control.  He is tolerating his medications as prescribed and these continue to work well for him.  He has been compliant with his regimen and no problems or untoward side effects are noted with his opioid medications.   Observations/Objective:   Current Outpatient Medications:  .  aspirin 81 MG tablet, Take 162 mg by mouth daily., Disp: , Rfl:  .  atenolol (TENORMIN) 100 MG tablet, Take 50 mg by mouth 2 (two) times daily., Disp: , Rfl:  .  diltiazem (CARDIZEM CD) 240 MG 24 hr capsule, Take 240 mg by mouth daily., Disp: , Rfl:  .  glipiZIDE (GLUCOTROL) 10 MG tablet, Take 10 mg 2 (two) times daily by mouth.,  Disp: , Rfl: 3 .  glucosamine-chondroitin 500-400 MG tablet, Take 1 tablet by mouth daily., Disp: , Rfl:  .  Melatonin 10 MG TABS, Take by mouth at bedtime as needed., Disp: , Rfl:  .  metFORMIN (GLUCOPHAGE) 500 MG tablet, Take 500 mg by mouth daily with breakfast. Reported on 02/10/2016, Disp: , Rfl:  .  Multiple Vitamins-Minerals (VITAMINS TO GO MEN PO), Take 1 tablet by mouth daily. , Disp: , Rfl:  .  nitroGLYCERIN (NITROSTAT) 0.4 MG SL tablet, Place 1 tablet under tongue as needed for chest pain (may repeat every 5 minutes but seek medical help if pain persists after 3 tablets) as needed, Disp: , Rfl:  .  Omega-3 Fatty Acids (FISH OIL) 1000 MG CAPS, Take by mouth daily., Disp: , Rfl:  .  oxyCODONE ER (XTAMPZA ER) 36 MG C12A, Take 2 capsules (72 mg total) by mouth 2 (two) times daily., Disp: 120 capsule, Rfl: 0 .  rosuvastatin (CRESTOR) 10 MG tablet, Take 10 mg by mouth daily., Disp: , Rfl:  .  tadalafil (CIALIS) 20 MG tablet, Take 20 mg by mouth daily as needed., Disp: , Rfl:  .  tiZANidine (ZANAFLEX) 4 MG tablet, TAKE 1 TABLET (4 MG TOTAL) BY MOUTH 2 (TWO) TIMES DAILY., Disp: 180 tablet, Rfl: 1 .  TraZODone HCl 150 MG TB24, Take by mouth at bedtime., Disp: , Rfl:  .  VITAMIN E PO, Take 90 Units by mouth daily., Disp: , Rfl:  Assessment and Plan: 1. Chronic  pain syndrome   2. Long term current use of opiate analgesic   3. Musculoskeletal pain   4. Chronic left-sided low back pain with left-sided sciatica   5. DDD (degenerative disc disease), lumbar   6. Spondylosis of lumbar region without myelopathy or radiculopathy   7. Facet arthritis of lumbar region   8. Sciatica of left side associated with disorder of lumbosacral spine   9. Lumbar spondylosis with myelopathy   Based on our discussion today I think it is appropriate to schedule him for a repeat epidural.  He has gotten good pain relief with this generally lasting 3 to 4 months before he has recurrence of the same quality pain.  We  have gone over the risks and benefits of the procedure with him in full detail.  He is currently taking Eliquis of asked him to discontinue this 4 days in advance of the procedure.  He will restart following the procedure the next day.  Also we will refill his medications for the April 29 and May 29 date.  I have reviewed the [facility] practitioner database information and is appropriate for refill.  He is encouraged to continue follow-up with his primary care physicians for his baseline medical care.  Follow Up Instructions:    I discussed the assessment and treatment plan with the patient. The patient was provided an opportunity to ask questions and all were answered. The patient agreed with the plan and demonstrated an understanding of the instructions.   The patient was advised to call back or seek an in-person evaluation if the symptoms worsen or if the condition fails to improve as anticipated.  I provided 30 minutes of non-face-to-face time during this encounter.   Molli Barrows, MD

## 2020-11-11 ENCOUNTER — Other Ambulatory Visit: Payer: Self-pay | Admitting: Anesthesiology

## 2020-11-11 ENCOUNTER — Other Ambulatory Visit: Payer: Self-pay

## 2020-11-11 ENCOUNTER — Encounter: Payer: Self-pay | Admitting: Anesthesiology

## 2020-11-11 ENCOUNTER — Ambulatory Visit (HOSPITAL_BASED_OUTPATIENT_CLINIC_OR_DEPARTMENT_OTHER): Payer: Medicare Other | Admitting: Anesthesiology

## 2020-11-11 ENCOUNTER — Ambulatory Visit
Admission: RE | Admit: 2020-11-11 | Discharge: 2020-11-11 | Disposition: A | Discharge status: Home / Self Care | Payer: Medicare Other | Source: Ambulatory Visit | Attending: Anesthesiology | Admitting: Anesthesiology

## 2020-11-11 VITALS — BP 126/81 | HR 61 | Temp 97.3°F | Resp 15 | Ht 70.0 in | Wt 200.0 lb

## 2020-11-11 DIAGNOSIS — R52 Pain, unspecified: Secondary | ICD-10-CM

## 2020-11-11 DIAGNOSIS — M7918 Myalgia, other site: Secondary | ICD-10-CM | POA: Diagnosis present

## 2020-11-11 DIAGNOSIS — Z79891 Long term (current) use of opiate analgesic: Secondary | ICD-10-CM

## 2020-11-11 DIAGNOSIS — M5387 Other specified dorsopathies, lumbosacral region: Secondary | ICD-10-CM | POA: Diagnosis present

## 2020-11-11 DIAGNOSIS — M5136 Other intervertebral disc degeneration, lumbar region: Secondary | ICD-10-CM

## 2020-11-11 DIAGNOSIS — M47816 Spondylosis without myelopathy or radiculopathy, lumbar region: Secondary | ICD-10-CM

## 2020-11-11 DIAGNOSIS — G8929 Other chronic pain: Secondary | ICD-10-CM | POA: Diagnosis present

## 2020-11-11 DIAGNOSIS — G894 Chronic pain syndrome: Secondary | ICD-10-CM

## 2020-11-11 DIAGNOSIS — M5442 Lumbago with sciatica, left side: Secondary | ICD-10-CM

## 2020-11-11 MED ORDER — LIDOCAINE HCL (PF) 1 % IJ SOLN
INTRAMUSCULAR | Status: AC
  Filled 2020-11-11: qty 10

## 2020-11-11 MED ORDER — IOHEXOL 180 MG/ML  SOLN
10.0000 mL | Freq: Once | INTRAMUSCULAR | Status: AC | PRN
  Administered 2020-11-11: 10 mL via EPIDURAL

## 2020-11-11 MED ORDER — TRIAMCINOLONE ACETONIDE 40 MG/ML IJ SUSP
INTRAMUSCULAR | Status: AC
  Filled 2020-11-11: qty 1

## 2020-11-11 MED ORDER — SODIUM CHLORIDE (PF) 0.9 % IJ SOLN
INTRAMUSCULAR | Status: AC
  Filled 2020-11-11: qty 10

## 2020-11-11 MED ORDER — ROPIVACAINE HCL 2 MG/ML IJ SOLN
10.0000 mL | Freq: Once | INTRAMUSCULAR | Status: AC
  Administered 2020-11-11: 10 mL via EPIDURAL

## 2020-11-11 MED ORDER — TRIAMCINOLONE ACETONIDE 40 MG/ML IJ SUSP
40.0000 mg | Freq: Once | INTRAMUSCULAR | Status: AC
  Administered 2020-11-11: 40 mg

## 2020-11-11 MED ORDER — SODIUM CHLORIDE 0.9% FLUSH
10.0000 mL | Freq: Once | INTRAVENOUS | Status: AC
  Administered 2020-11-11: 10 mL

## 2020-11-11 MED ORDER — ROPIVACAINE HCL 2 MG/ML IJ SOLN
INTRAMUSCULAR | Status: AC
  Filled 2020-11-11: qty 10

## 2020-11-11 MED ORDER — LIDOCAINE HCL (PF) 1 % IJ SOLN
5.0000 mL | Freq: Once | INTRAMUSCULAR | Status: AC
  Administered 2020-11-11: 5 mL via SUBCUTANEOUS

## 2020-11-11 NOTE — Patient Instructions (Addendum)
Pain Management Discharge Instructions  General Discharge Instructions : RESTART BLOOD THINNER TOMORROW 11/12/2020!  If you need to reach your doctor call: Monday-Friday 8:00 am - 4:00 pm at [phone] or toll free [phone].  After clinic hours [phone] to have operator reach doctor.  Bring all of your medication bottles to all your appointments in the pain clinic.  To cancel or reschedule your appointment with Pain Management please remember to call 24 hours in advance to avoid a fee.  Refer to the educational materials which you have been given on: General Risks, I had my Procedure. Discharge Instructions, Post Sedation.  Post Procedure Instructions:  The drugs you were given will stay in your system until tomorrow, so for the next 24 hours you should not drive, make any legal decisions or drink any alcoholic beverages.  You may eat anything you prefer, but it is better to start with liquids then soups and crackers, and gradually work up to solid foods.  Please notify your doctor immediately if you have any unusual bleeding, trouble breathing or pain that is not related to your normal pain.  Depending on the type of procedure that was done, some parts of your body may feel week and/or numb.  This usually clears up by tonight or the next day.  Walk with the use of an assistive device or accompanied by an adult for the 24 hours.  You may use ice on the affected area for the first 24 hours.  Put ice in a Ziploc bag and cover with a towel and place against area 15 minutes on 15 minutes off.  You may switch to heat after 24 hours.Epidural Steroid Injection Patient Information  Description: The epidural space surrounds the nerves as they exit the spinal cord.  In some patients, the nerves can be compressed and inflamed by a bulging disc or a tight spinal canal (spinal stenosis).  By injecting steroids into the epidural space, we can bring irritated nerves into direct contact with  a potentially helpful medication.  These steroids act directly on the irritated nerves and can reduce swelling and inflammation which often leads to decreased pain.  Epidural steroids may be injected anywhere along the spine and from the neck to the low back depending upon the location of your pain.   After numbing the skin with local anesthetic (like Novocaine), a small needle is passed into the epidural space slowly.  You may experience a sensation of pressure while this is being done.  The entire block usually last less than 10 minutes.  Conditions which may be treated by epidural steroids:   Low back and leg pain  Neck and arm pain  Spinal stenosis  Post-laminectomy syndrome  Herpes zoster (shingles) pain  Pain from compression fractures  Preparation for the injection:  1. Do not eat any solid food or dairy products within 8 hours of your appointment.  2. You may drink clear liquids up to 3 hours before appointment.  Clear liquids include water, black coffee, juice or soda.  No milk or cream please. 3. You may take your regular medication, including pain medications, with a sip of water before your appointment  Diabetics should hold regular insulin (if taken separately) and take 1/2 normal NPH dos the morning of the procedure.  Carry some sugar containing items with you to your appointment. 4. A driver must accompany you and be prepared to drive you home after your procedure.  5. Bring all your current medications with your. 6. An  IV may be inserted and sedation may be given at the discretion of the physician.   7. A blood pressure cuff, EKG and other monitors will often be applied during the procedure.  Some patients may need to have extra oxygen administered for a short period. 8. You will be asked to provide medical information, including your allergies, prior to the procedure.  We must know immediately if you are taking blood thinners (like Coumadin/Warfarin)  Or if you are  allergic to IV iodine contrast (dye). We must know if you could possible be pregnant.  Possible side-effects:  Bleeding from needle site  Infection (rare, may require surgery)  Nerve injury (rare)  Numbness & tingling (temporary)  Difficulty urinating (rare, temporary)  Spinal headache ( a headache worse with upright posture)  Light -headedness (temporary)  Pain at injection site (several days)  Decreased blood pressure (temporary)  Weakness in arm/leg (temporary)  Pressure sensation in back/neck (temporary)  Call if you experience:  Fever/chills associated with headache or increased back/neck pain.  Headache worsened by an upright position.  New onset weakness or numbness of an extremity below the injection site  Hives or difficulty breathing (go to the emergency room)  Inflammation or drainage at the infection site  Severe back/neck pain  Any new symptoms which are concerning to you  Please note:  Although the local anesthetic injected can often make your back or neck feel good for several hours after the injection, the pain will likely return.  It takes 3-7 days for steroids to work in the epidural space.  You may not notice any pain relief for at least that one week.  If effective, we will often do a series of three injections spaced 3-6 weeks apart to maximally decrease your pain.  After the initial series, we generally will wait several months before considering a repeat injection of the same type.  If you have any questions, please call [phone] [facility]

## 2020-11-11 NOTE — Progress Notes (Signed)
Safety precautions to be maintained throughout the outpatient stay will include: orient to surroundings, keep bed in low position, maintain call bell within reach at all times, provide assistance with transfer out of bed and ambulation.  

## 2020-11-11 NOTE — Progress Notes (Signed)
Subjective:  Patient ID: Peter Lamb, male    DOB: Jul 30, 1955  Age: 65 y.o. MRN: [id number]  CC: Back Pain (Left, lower)   Procedure: L5-S1 epidural steroid and fluoroscopic guidance with no sedation  HPI Peter Lamb presents for reevaluation.  He was last seen few months ago and has had a significant increase in his left lower extremity pain and sciatica symptoms.  He receives periodic epidural injections for his low back pain and sciatica.  His last injection was in December.  He generally has 75 to 80% relief lasting about 2 to 3 months before he has recurrence of the same quality pain.  He has failed more conservative therapy and presents today for repeat epidural.  Otherwise he is in his usual state of health with no new changes in lower extremity strength or function or bowel or bladder function.  He is taking his medications as prescribed and these continue to work well for him.  The pain he describes radiates from his low back into the posterior lateral leg with associated cramping in the left calf with associated numbness and tingling periodic worse with prolonged standing and activity.  This generally moderates or resolves completely following his epidural injections.  Outpatient Medications Prior to Visit  Medication Sig Dispense Refill  . apixaban (ELIQUIS) 5 MG TABS tablet TAKE ONE TABLET BY MOUTH TWICE A DAY (CAUTION: BLOOD THINNER)    . aspirin 81 MG tablet Take 162 mg by mouth daily.    Marland Kitchen atenolol (TENORMIN) 100 MG tablet Take 50 mg by mouth 2 (two) times daily.    Marland Kitchen diltiazem (CARDIZEM CD) 240 MG 24 hr capsule Take 240 mg by mouth daily.    Marland Kitchen glipiZIDE (GLUCOTROL) 10 MG tablet Take 10 mg 2 (two) times daily by mouth.  3  . glucosamine-chondroitin 500-400 MG tablet Take 1 tablet by mouth daily.    . Melatonin 10 MG TABS Take by mouth at bedtime as needed.    . metFORMIN (GLUCOPHAGE) 500 MG tablet Take 500 mg by mouth daily with breakfast. Reported on 02/10/2016    .  Multiple Vitamins-Minerals (VITAMINS TO GO MEN PO) Take 1 tablet by mouth daily.     . nitroGLYCERIN (NITROSTAT) 0.4 MG SL tablet Place 1 tablet under tongue as needed for chest pain (may repeat every 5 minutes but seek medical help if pain persists after 3 tablets) as needed    . Omega-3 Fatty Acids (FISH OIL) 1000 MG CAPS Take by mouth daily.    Derrill Memo ON 11/22/2020] oxyCODONE ER (XTAMPZA ER) 36 MG C12A Take 2 capsules (72 mg total) by mouth 2 (two) times daily. 120 capsule 0  . [START ON 12/21/2020] oxyCODONE ER (XTAMPZA ER) 36 MG C12A Take 2 capsules (72 mg total) by mouth 2 (two) times daily. 120 capsule 0  . rosuvastatin (CRESTOR) 10 MG tablet Take 10 mg by mouth daily.    . tadalafil (CIALIS) 20 MG tablet Take 20 mg by mouth daily as needed.    . TraZODone HCl 150 MG TB24 Take by mouth at bedtime.    Marland Kitchen VITAMIN E PO Take 90 Units by mouth daily.    Marland Kitchen tiZANidine (ZANAFLEX) 4 MG tablet TAKE 1 TABLET (4 MG TOTAL) BY MOUTH 2 (TWO) TIMES DAILY. 180 tablet 1   No facility-administered medications prior to visit.    Review of Systems CNS: No confusion or sedation Cardiac: No angina or palpitations GI: No abdominal pain or constipation Constitutional: No nausea  vomiting fevers or chills  Objective:  BP 126/81   Pulse 61   Temp (!) 97.3 F (36.3 C) (Temporal)   Resp 15   Ht 5\' 10"  (1.778 m)   Wt 200 lb (90.7 kg)   SpO2 95%   BMI 28.70 kg/m    BP Readings from Last 3 Encounters:  11/11/20 126/81  07/04/20 (!) 141/91  03/06/20 (!) 155/105     Wt Readings from Last 3 Encounters:  11/11/20 200 lb (90.7 kg)  07/04/20 218 lb (98.9 kg)  03/06/20 210 lb (95.3 kg)     Physical Exam Pt is alert and oriented PERRL EOMI HEART IS RRR no murmur or rub LCTA no wheezing or rales MUSCULOSKELETAL reveals some paraspinous muscle tenderness but no overt trigger points.  He does have a positive straight leg raise on the left side negative on the right and he ambulates with an antalgic  gait.  His muscle tone and bulk is at baseline.  Labs  Lab Results  Component Value Date   HGBA1C 9.0 03/04/2010   Lab Results  Component Value Date   CREATININE 0.92 08/21/2016    -------------------------------------------------------------------------------------------------------------------- Lab Results  Component Value Date   WBC 9.2 08/21/2016   HGB 15.3 08/21/2016   HCT 43.6 08/21/2016   PLT 219 08/21/2016   GLUCOSE 132 (H) 08/21/2016   ALT 31 03/04/2010   AST 23 03/04/2010   NA 135 08/21/2016   K 3.7 08/21/2016   CL 100 (L) 08/21/2016   CREATININE 0.92 08/21/2016   BUN 11 08/21/2016   CO2 26 08/21/2016   PSA 0.26 03/04/2010   HGBA1C 9.0 03/04/2010    --------------------------------------------------------------------------------------------------------------------- DG PAIN CLINIC C-ARM 1-60 MIN NO REPORT  Result Date: 11/11/2020 Fluoro was used, but no Radiologist interpretation will be provided. Please refer to "NOTES" tab for provider progress note.    Assessment & Plan:   Kirtan was seen today for back pain.  Diagnoses and all orders for this visit:  Chronic pain syndrome  Long term current use of opiate analgesic  Musculoskeletal pain  Chronic left-sided low back pain with left-sided sciatica  DDD (degenerative disc disease), lumbar  Sciatica of left side associated with disorder of lumbosacral spine  Facet arthritis of lumbar region  Other orders -     triamcinolone acetonide (KENALOG-40) injection 40 mg -     sodium chloride flush (NS) 0.9 % injection 10 mL -     ropivacaine (PF) 2 mg/mL (0.2%) (NAROPIN) injection 10 mL -     lidocaine (PF) (XYLOCAINE) 1 % injection 5 mL -     iohexol (OMNIPAQUE) 180 MG/ML injection 10 mL        ----------------------------------------------------------------------------------------------------------------------  Problem List Items Addressed This Visit      Unprioritized   Chronic low back  pain (Chronic)   Chronic pain - Primary (Chronic)   DDD (degenerative disc disease), lumbar   Long term current use of opiate analgesic (Chronic)   Musculoskeletal pain (Chronic)   Sciatica of left side associated with disorder of lumbosacral spine    Other Visit Diagnoses    Facet arthritis of lumbar region       Relevant Medications   triamcinolone acetonide (KENALOG-40) injection 40 mg (Completed)        ----------------------------------------------------------------------------------------------------------------------  1. Chronic pain syndrome We will continue his current pain management medication management with no changes made today.  2. Long term current use of opiate analgesic As above  3. Musculoskeletal pain Continue core stretching strengthening  exercises.  4. Chronic left-sided low back pain with left-sided sciatica We will proceed with a repeat epidural today.  The risks and benefits of been reviewed all questions answered and we will have him return to clinic in 2 months.  He is to continue current medication management  5. DDD (degenerative disc disease), lumbar As above  6. Sciatica of left side associated with disorder of lumbosacral spine As above and continue core strengthening  7. Facet arthritis of lumbar region As above    ----------------------------------------------------------------------------------------------------------------------  I am having Vanna N. Kestenbaum maintain his aspirin, metFORMIN, atenolol, diltiazem, Multiple Vitamins-Minerals (VITAMINS TO GO MEN PO), rosuvastatin, TraZODone HCl, glipiZIDE, nitroGLYCERIN, Fish Oil, VITAMIN E PO, Melatonin, glucosamine-chondroitin, tadalafil, tiZANidine, Xtampza ER, Xtampza ER, and apixaban. We administered triamcinolone acetonide, sodium chloride flush, ropivacaine (PF) 2 mg/mL (0.2%), lidocaine (PF), and iohexol.   Meds ordered this encounter  Medications  . triamcinolone acetonide  (KENALOG-40) injection 40 mg  . sodium chloride flush (NS) 0.9 % injection 10 mL  . ropivacaine (PF) 2 mg/mL (0.2%) (NAROPIN) injection 10 mL  . lidocaine (PF) (XYLOCAINE) 1 % injection 5 mL  . iohexol (OMNIPAQUE) 180 MG/ML injection 10 mL   Patient's Medications  New Prescriptions   No medications on file  Previous Medications   APIXABAN (ELIQUIS) 5 MG TABS TABLET    TAKE ONE TABLET BY MOUTH TWICE A DAY (CAUTION: BLOOD THINNER)   ASPIRIN 81 MG TABLET    Take 162 mg by mouth daily.   ATENOLOL (TENORMIN) 100 MG TABLET    Take 50 mg by mouth 2 (two) times daily.   DILTIAZEM (CARDIZEM CD) 240 MG 24 HR CAPSULE    Take 240 mg by mouth daily.   GLIPIZIDE (GLUCOTROL) 10 MG TABLET    Take 10 mg 2 (two) times daily by mouth.   GLUCOSAMINE-CHONDROITIN 500-400 MG TABLET    Take 1 tablet by mouth daily.   MELATONIN 10 MG TABS    Take by mouth at bedtime as needed.   METFORMIN (GLUCOPHAGE) 500 MG TABLET    Take 500 mg by mouth daily with breakfast. Reported on 02/10/2016   MULTIPLE VITAMINS-MINERALS (VITAMINS TO GO MEN PO)    Take 1 tablet by mouth daily.    NITROGLYCERIN (NITROSTAT) 0.4 MG SL TABLET    Place 1 tablet under tongue as needed for chest pain (may repeat every 5 minutes but seek medical help if pain persists after 3 tablets) as needed   OMEGA-3 FATTY ACIDS (FISH OIL) 1000 MG CAPS    Take by mouth daily.   OXYCODONE ER (XTAMPZA ER) 36 MG C12A    Take 2 capsules (72 mg total) by mouth 2 (two) times daily.   OXYCODONE ER (XTAMPZA ER) 36 MG C12A    Take 2 capsules (72 mg total) by mouth 2 (two) times daily.   ROSUVASTATIN (CRESTOR) 10 MG TABLET    Take 10 mg by mouth daily.   TADALAFIL (CIALIS) 20 MG TABLET    Take 20 mg by mouth daily as needed.   TIZANIDINE (ZANAFLEX) 4 MG TABLET    TAKE 1 TABLET (4 MG TOTAL) BY MOUTH 2 (TWO) TIMES DAILY.   TRAZODONE HCL 150 MG TB24    Take by mouth at bedtime.   VITAMIN E PO    Take 90 Units by mouth daily.  Modified Medications   No medications on file   Discontinued Medications   No medications on file   ----------------------------------------------------------------------------------------------------------------------  Follow-up: Return in about 2  months (around 01/11/2021) for evaluation, med refill.   Procedure: L5-S1 LESI with fluoroscopic guidance and no moderate sedation  NOTE: The risks, benefits, and expectations of the procedure have been discussed and explained to the patient who was understanding and in agreement with suggested treatment plan. No guarantees were made.  DESCRIPTION OF PROCEDURE: Lumbar epidural steroid injection with no IV Versed, EKG, blood pressure, pulse, and pulse oximetry monitoring. The procedure was performed with the patient in the prone position under fluoroscopic guidance.  Sterile prep x3 was initiated and I then injected subcutaneous lidocaine to the overlying L5-S1 site after its fluoroscopic identifictation.  Using strict aseptic technique, I then advanced an 18-gauge Tuohy epidural needle in the midline using interlaminar approach via loss-of-resistance to saline technique. There was negative aspiration for heme or  CSF.  I then confirmed position with both AP and Lateral fluoroscan.  2 cc of contrast dye were injected and a  total of 5 mL of Preservative-Free normal saline mixed with 40 mg of Kenalog and 1cc Ropicaine 0.2 percent were injected incrementally via the  epidurally placed needle. The needle was removed. The patient tolerated the injection well and was convalesced and discharged to home in stable condition. Should the patient have any post procedure difficulty they have been instructed on how to contact us for assistance.    Molli Barrows, MD

## 2020-11-12 ENCOUNTER — Telehealth: Payer: Self-pay | Admitting: *Deleted

## 2020-11-12 NOTE — Telephone Encounter (Signed)
Attempted to call for post procedure follow-up. Message left. 

## 2020-12-02 ENCOUNTER — Other Ambulatory Visit: Payer: Self-pay | Admitting: Anesthesiology

## 2020-12-03 NOTE — Telephone Encounter (Signed)
Patient called asking for refill on tizanidine he just had appt 11-11-20 Please ask Dr. Andree Elk if he can send in refill  Next appt is scheduled for 01-13-21

## 2021-01-13 ENCOUNTER — Other Ambulatory Visit: Payer: Self-pay

## 2021-01-13 ENCOUNTER — Encounter: Payer: Self-pay | Admitting: Anesthesiology

## 2021-01-13 ENCOUNTER — Ambulatory Visit: Payer: Medicare Other | Attending: Anesthesiology | Admitting: Anesthesiology

## 2021-01-13 DIAGNOSIS — G8929 Other chronic pain: Secondary | ICD-10-CM

## 2021-01-13 DIAGNOSIS — M5387 Other specified dorsopathies, lumbosacral region: Secondary | ICD-10-CM

## 2021-01-13 DIAGNOSIS — M47816 Spondylosis without myelopathy or radiculopathy, lumbar region: Secondary | ICD-10-CM

## 2021-01-13 DIAGNOSIS — G894 Chronic pain syndrome: Secondary | ICD-10-CM | POA: Diagnosis not present

## 2021-01-13 DIAGNOSIS — Z79891 Long term (current) use of opiate analgesic: Secondary | ICD-10-CM

## 2021-01-13 DIAGNOSIS — M5136 Other intervertebral disc degeneration, lumbar region: Secondary | ICD-10-CM

## 2021-01-13 DIAGNOSIS — M792 Neuralgia and neuritis, unspecified: Secondary | ICD-10-CM

## 2021-01-13 DIAGNOSIS — M7918 Myalgia, other site: Secondary | ICD-10-CM

## 2021-01-13 DIAGNOSIS — M5442 Lumbago with sciatica, left side: Secondary | ICD-10-CM

## 2021-01-13 DIAGNOSIS — M4716 Other spondylosis with myelopathy, lumbar region: Secondary | ICD-10-CM

## 2021-01-13 MED ORDER — XTAMPZA ER 36 MG PO C12A: 2.0000 | EXTENDED_RELEASE_CAPSULE | Freq: Two times a day (BID) | ORAL | 0 refills | Status: AC

## 2021-01-13 MED ORDER — XTAMPZA ER 36 MG PO C12A: 2.0000 | EXTENDED_RELEASE_CAPSULE | Freq: Two times a day (BID) | ORAL | 0 refills | Status: DC

## 2021-01-13 NOTE — Progress Notes (Signed)
Virtual Visit via Telephone Note  I connected with Peter Lamb on 01/13/21 at  1:30 PM EDT by telephone and verified that I am speaking with the correct person using two identifiers.  Location: Patient: Home Provider: [facility] control center   I discussed the limitations, risks, security and privacy concerns of performing an evaluation and management service by telephone and the availability of in person appointments. I also discussed with the patient that there may be a patient responsible charge related to this service. The patient expressed understanding and agreed to proceed.   History of Present Illness: I spoke with Peter Lamb today via telephone as he was unable to do the video portion of the conference and reports that his left lower back is still bothering him.  He has some intermittent pain that is worse with activity and some spasming in the left lower back.  Following his April epidural he no longer has any left lower leg sciatica symptoms.  Otherwise bowel bladder function lower extremity strength and function is good and his back pain is generally managed well by his opioid medications.  He takes these twice a day and continues to get good relief and has been on them chronically for pain relief.  Otherwise no significant changes are reported today.  Review of systems    Observations/Objective:  Current Outpatient Medications:    [START ON 02/19/2021] oxyCODONE ER (XTAMPZA ER) 36 MG C12A, Take 2 capsules (72 mg total) by mouth 2 (two) times daily., Disp: 120 capsule, Rfl: 0   apixaban (ELIQUIS) 5 MG TABS tablet, TAKE ONE TABLET BY MOUTH TWICE A DAY (CAUTION: BLOOD THINNER), Disp: , Rfl:    aspirin 81 MG tablet, Take 162 mg by mouth daily., Disp: , Rfl:    atenolol (TENORMIN) 100 MG tablet, Take 50 mg by mouth 2 (two) times daily., Disp: , Rfl:    diltiazem (CARDIZEM CD) 240 MG 24 hr capsule, Take 240 mg by mouth daily., Disp: , Rfl:    glipiZIDE (GLUCOTROL) 10 MG tablet, Take  10 mg 2 (two) times daily by mouth., Disp: , Rfl: 3   glucosamine-chondroitin 500-400 MG tablet, Take 1 tablet by mouth daily., Disp: , Rfl:    Melatonin 10 MG TABS, Take by mouth at bedtime as needed., Disp: , Rfl:    metFORMIN (GLUCOPHAGE) 500 MG tablet, Take 500 mg by mouth daily with breakfast. Reported on 02/10/2016, Disp: , Rfl:    Multiple Vitamins-Minerals (VITAMINS TO GO MEN PO), Take 1 tablet by mouth daily. , Disp: , Rfl:    nitroGLYCERIN (NITROSTAT) 0.4 MG SL tablet, Place 1 tablet under tongue as needed for chest pain (may repeat every 5 minutes but seek medical help if pain persists after 3 tablets) as needed, Disp: , Rfl:    Omega-3 Fatty Acids (FISH OIL) 1000 MG CAPS, Take by mouth daily., Disp: , Rfl:    [START ON 01/20/2021] oxyCODONE ER (XTAMPZA ER) 36 MG C12A, Take 2 capsules (72 mg total) by mouth 2 (two) times daily., Disp: 120 capsule, Rfl: 0   rosuvastatin (CRESTOR) 10 MG tablet, Take 10 mg by mouth daily., Disp: , Rfl:    tadalafil (CIALIS) 20 MG tablet, Take 20 mg by mouth daily as needed., Disp: , Rfl:    tiZANidine (ZANAFLEX) 4 MG tablet, TAKE 1 TABLET (4 MG TOTAL) BY MOUTH 2 (TWO) TIMES DAILY., Disp: 180 tablet, Rfl: 1   TraZODone HCl 150 MG TB24, Take by mouth at bedtime., Disp: , Rfl:  VITAMIN E PO, Take 90 Units by mouth daily., Disp: , Rfl:    Assessment and Plan: 1. Chronic pain syndrome   2. Long term current use of opiate analgesic   3. Musculoskeletal pain   4. Sciatica of left side associated with disorder of lumbosacral spine   5. DDD (degenerative disc disease), lumbar   6. Chronic left-sided low back pain with left-sided sciatica   7. Spondylosis of lumbar region without myelopathy or radiculopathy   8. Facet arthritis of lumbar region   9. Lumbar spondylosis with myelopathy   10. Neurogenic pain   Based on our discussion today and upon review of the [facility] practitioner database information going to refill [name]'s medicines.  He has done well  with this regimen and continues to get good relief rated at 75 to 80% generally lasting 8 to 12 hours.  He is having minimal breakthrough pain and no side effects  He derives good functional benefits with no side effects.  I encouraged him to continue with stretching strengthening exercises as well.  His sciatica is definitely better since the epidural however he may need a trigger point injection based on his low back spasming that he is described today.  We will schedule him for return to clinic in 2 months  Follow Up Instructions:    I discussed the assessment and treatment plan with the patient. The patient was provided an opportunity to ask questions and all were answered. The patient agreed with the plan and demonstrated an understanding of the instructions.   The patient was advised to call back or seek an in-person evaluation if the symptoms worsen or if the condition fails to improve as anticipated.  I provided 30 minutes of non-face-to-face time during this encounter.   Molli Barrows, MD

## 2021-03-12 ENCOUNTER — Ambulatory Visit: Payer: Medicare Other | Attending: Anesthesiology | Admitting: Anesthesiology

## 2021-03-12 ENCOUNTER — Encounter: Payer: Self-pay | Admitting: Anesthesiology

## 2021-03-12 ENCOUNTER — Other Ambulatory Visit: Payer: Self-pay

## 2021-03-12 DIAGNOSIS — Z79891 Long term (current) use of opiate analgesic: Secondary | ICD-10-CM

## 2021-03-12 DIAGNOSIS — M5387 Other specified dorsopathies, lumbosacral region: Secondary | ICD-10-CM

## 2021-03-12 DIAGNOSIS — G894 Chronic pain syndrome: Secondary | ICD-10-CM | POA: Diagnosis not present

## 2021-03-12 DIAGNOSIS — M5442 Lumbago with sciatica, left side: Secondary | ICD-10-CM

## 2021-03-12 DIAGNOSIS — M5136 Other intervertebral disc degeneration, lumbar region: Secondary | ICD-10-CM

## 2021-03-12 DIAGNOSIS — G8929 Other chronic pain: Secondary | ICD-10-CM

## 2021-03-12 DIAGNOSIS — M47816 Spondylosis without myelopathy or radiculopathy, lumbar region: Secondary | ICD-10-CM

## 2021-03-12 DIAGNOSIS — M7918 Myalgia, other site: Secondary | ICD-10-CM | POA: Diagnosis not present

## 2021-03-12 DIAGNOSIS — M792 Neuralgia and neuritis, unspecified: Secondary | ICD-10-CM

## 2021-03-12 MED ORDER — XTAMPZA ER 36 MG PO C12A: 2.0000 | EXTENDED_RELEASE_CAPSULE | Freq: Two times a day (BID) | ORAL | 0 refills | Status: DC

## 2021-03-12 MED ORDER — XTAMPZA ER 36 MG PO C12A: 2.0000 | EXTENDED_RELEASE_CAPSULE | Freq: Two times a day (BID) | ORAL | 0 refills | Status: AC

## 2021-03-14 NOTE — Progress Notes (Signed)
Virtual Visit via Telephone Note  I connected with Peter Lamb on 03/14/21 at  4:20 PM EDT by telephone and verified that I am speaking with the correct person using two identifiers.  Location: Patient: Home Provider: Pain control center   I discussed the limitations, risks, security and privacy concerns of performing an evaluation and management service by telephone and the availability of in person appointments. I also discussed with the patient that there may be a patient responsible charge related to this service. The patient expressed understanding and agreed to proceed.   H Iistory of Present Illness: I spoke with Jocelyn Lamer today via telephone as we were unable to do the video portion of virtual conferencing reports that he has been doing well since his April epidural other than some recurrence of the sciatica symptoms that he had previously had.  He gets these injections done periodically for his low back pain and sciatica and generally does very well for about 2 to 3 months.  He rates his pain relief at about 75 to 80%.  It enables him to do his activities and stay active with physical therapy and sleep better at night.  He still taking his Xtampza ER as prescribed and these continue to work well for him as well.  No side effects are reported and he continues to get good relief.  He denies any diverting or illicit use otherwise no change in lower extremity strength or function or bowel or bladder function is noted at this time.  Review of systems: General: No fevers or chills Pulmonary: No shortness of breath or dyspnea Cardiac: No angina or palpitations or lightheadedness GI: No abdominal pain or constipation Psych: No depression    Observations/Objective:  Current Outpatient Medications:    [START ON 04/19/2021] oxyCODONE ER (XTAMPZA ER) 36 MG C12A, Take 2 capsules (72 mg total) by mouth 2 (two) times daily., Disp: 120 capsule, Rfl: 0   apixaban (ELIQUIS) 5 MG TABS tablet, TAKE ONE  TABLET BY MOUTH TWICE A DAY (CAUTION: BLOOD THINNER), Disp: , Rfl:    aspirin 81 MG tablet, Take 162 mg by mouth daily., Disp: , Rfl:    atenolol (TENORMIN) 100 MG tablet, Take 50 mg by mouth 2 (two) times daily., Disp: , Rfl:    diltiazem (CARDIZEM CD) 240 MG 24 hr capsule, Take 240 mg by mouth daily., Disp: , Rfl:    glipiZIDE (GLUCOTROL) 10 MG tablet, Take 10 mg 2 (two) times daily by mouth., Disp: , Rfl: 3   glucosamine-chondroitin 500-400 MG tablet, Take 1 tablet by mouth daily., Disp: , Rfl:    Melatonin 10 MG TABS, Take by mouth at bedtime as needed., Disp: , Rfl:    metFORMIN (GLUCOPHAGE) 500 MG tablet, Take 500 mg by mouth daily with breakfast. Reported on 02/10/2016, Disp: , Rfl:    Multiple Vitamins-Minerals (VITAMINS TO GO MEN PO), Take 1 tablet by mouth daily. , Disp: , Rfl:    nitroGLYCERIN (NITROSTAT) 0.4 MG SL tablet, Place 1 tablet under tongue as needed for chest pain (may repeat every 5 minutes but seek medical help if pain persists after 3 tablets) as needed, Disp: , Rfl:    Omega-3 Fatty Acids (FISH OIL) 1000 MG CAPS, Take by mouth daily., Disp: , Rfl:    [START ON 03/21/2021] oxyCODONE ER (XTAMPZA ER) 36 MG C12A, Take 2 capsules (72 mg total) by mouth 2 (two) times daily., Disp: 120 capsule, Rfl: 0   rosuvastatin (CRESTOR) 10 MG tablet, Take 10 mg  by mouth daily., Disp: , Rfl:    tadalafil (CIALIS) 20 MG tablet, Take 20 mg by mouth daily as needed., Disp: , Rfl:    tiZANidine (ZANAFLEX) 4 MG tablet, TAKE 1 TABLET (4 MG TOTAL) BY MOUTH 2 (TWO) TIMES DAILY., Disp: 180 tablet, Rfl: 1   TraZODone HCl 150 MG TB24, Take by mouth at bedtime., Disp: , Rfl:    VITAMIN E PO, Take 90 Units by mouth daily., Disp: , Rfl:    Assessment and Plan: 1. Chronic pain syndrome   2. Long term current use of opiate analgesic   3. Musculoskeletal pain   4. Sciatica of left side associated with disorder of lumbosacral spine   5. DDD (degenerative disc disease), lumbar   6. Chronic left-sided low  back pain with left-sided sciatica   7. Spondylosis of lumbar region without myelopathy or radiculopathy   8. Facet arthritis of lumbar region   9. Neurogenic pain   Based on her discussion today and upon review of the [facility] practitioner database information I am refilling his medicines for August 26 and September 25.  He is due for routine urine screen for annual evaluation and we will do that as at his next appointment in 2 months and I also scheduled him for a routine epidural steroid injection secondary to his sciatica symptoms.  I want him to continue with stretching strengthening exercises no other changes will be initiated in his pain management protocol and he is to continue follow-up with his primary care physicians for baseline medical care.  Follow Up Instructions:    I discussed the assessment and treatment plan with the patient. The patient was provided an opportunity to ask questions and all were answered. The patient agreed with the plan and demonstrated an understanding of the instructions.   The patient was advised to call back or seek an in-person evaluation if the symptoms worsen or if the condition fails to improve as anticipated.  I provided 30 minutes of non-face-to-face time during this encounter.   Molli Barrows, MD

## 2021-03-20 NOTE — Telephone Encounter (Signed)
error 

## 2021-05-16 ENCOUNTER — Ambulatory Visit: Payer: Medicare Other | Attending: Anesthesiology | Admitting: Anesthesiology

## 2021-05-16 ENCOUNTER — Encounter: Payer: Self-pay | Admitting: Anesthesiology

## 2021-05-16 ENCOUNTER — Other Ambulatory Visit: Payer: Self-pay

## 2021-05-16 DIAGNOSIS — Z79891 Long term (current) use of opiate analgesic: Secondary | ICD-10-CM

## 2021-05-16 DIAGNOSIS — M5136 Other intervertebral disc degeneration, lumbar region: Secondary | ICD-10-CM

## 2021-05-16 DIAGNOSIS — M4716 Other spondylosis with myelopathy, lumbar region: Secondary | ICD-10-CM

## 2021-05-16 DIAGNOSIS — M5387 Other specified dorsopathies, lumbosacral region: Secondary | ICD-10-CM | POA: Diagnosis not present

## 2021-05-16 DIAGNOSIS — G894 Chronic pain syndrome: Secondary | ICD-10-CM | POA: Diagnosis not present

## 2021-05-16 DIAGNOSIS — M792 Neuralgia and neuritis, unspecified: Secondary | ICD-10-CM

## 2021-05-16 DIAGNOSIS — M7918 Myalgia, other site: Secondary | ICD-10-CM

## 2021-05-16 MED ORDER — XTAMPZA ER 36 MG PO C12A: 2.0000 | EXTENDED_RELEASE_CAPSULE | Freq: Two times a day (BID) | ORAL | 0 refills | Status: DC

## 2021-05-16 NOTE — Progress Notes (Signed)
Virtual Visit via Telephone Note  I connected with Peter Lamb on 05/16/21 at  1:00 PM EDT by telephone and verified that I am speaking with the correct person using two identifiers.  Location: Patient: Home Provider: Pain control center   I discussed the limitations, risks, security and privacy concerns of performing an evaluation and management service by telephone and the availability of in person appointments. I also discussed with the patient that there may be a patient responsible charge related to this service. The patient expressed understanding and agreed to proceed.   History of Present Illness: I spoke with Peter Lamb via telephone today as we were unable link for the video portion of the conference he reports that he is doing reasonably well but he has had some recurrence of his left posterior lateral leg pain with radiation into the calf and buttock region.  Gets cramping in the left calf and this is consistent with what he is had in the past with his sciatica flareups.  His last epidural was in April and he generally gets 50 to 75% improvement lasting about 3 to 4 months before he has recurrence.  He takes his medications for the chronic back pain and hip pain and these continue to work well for him.  He denies any diverting or illicit use or problems with the medication and maintains that he continues to derive good functional benefit from them.  He denies any change in lower extremity strength or function or bowel or bladder function either.  Otherwise he is in his usual state of health and the pain has persisted in the left leg despite efforts of physical therapy stretching strengthening etc.  Review of systems: General: No fevers or chills Pulmonary: No shortness of breath or dyspnea Cardiac: No angina or palpitations or lightheadedness GI: No abdominal pain or constipation Psych: No depression    Observations/Objective:  Current Outpatient Medications:    [START ON  06/18/2021] oxyCODONE ER (XTAMPZA ER) 36 MG C12A, Take 2 capsules (72 mg total) by mouth 2 (two) times daily., Disp: 120 capsule, Rfl: 0   apixaban (ELIQUIS) 5 MG TABS tablet, TAKE ONE TABLET BY MOUTH TWICE A DAY (CAUTION: BLOOD THINNER), Disp: , Rfl:    aspirin 81 MG tablet, Take 162 mg by mouth daily., Disp: , Rfl:    atenolol (TENORMIN) 100 MG tablet, Take 50 mg by mouth 2 (two) times daily., Disp: , Rfl:    diltiazem (CARDIZEM CD) 240 MG 24 hr capsule, Take 240 mg by mouth daily., Disp: , Rfl:    glipiZIDE (GLUCOTROL) 10 MG tablet, Take 10 mg 2 (two) times daily by mouth., Disp: , Rfl: 3   glucosamine-chondroitin 500-400 MG tablet, Take 1 tablet by mouth daily., Disp: , Rfl:    Melatonin 10 MG TABS, Take by mouth at bedtime as needed., Disp: , Rfl:    metFORMIN (GLUCOPHAGE) 500 MG tablet, Take 500 mg by mouth daily with breakfast. Reported on 02/10/2016, Disp: , Rfl:    Multiple Vitamins-Minerals (VITAMINS TO GO MEN PO), Take 1 tablet by mouth daily. , Disp: , Rfl:    nitroGLYCERIN (NITROSTAT) 0.4 MG SL tablet, Place 1 tablet under tongue as needed for chest pain (may repeat every 5 minutes but seek medical help if pain persists after 3 tablets) as needed, Disp: , Rfl:    Omega-3 Fatty Acids (FISH OIL) 1000 MG CAPS, Take by mouth daily., Disp: , Rfl:    [START ON 05/19/2021] oxyCODONE ER (XTAMPZA ER) 36 MG  C12A, Take 2 capsules (72 mg total) by mouth 2 (two) times daily., Disp: 120 capsule, Rfl: 0   rosuvastatin (CRESTOR) 10 MG tablet, Take 10 mg by mouth daily., Disp: , Rfl:    tadalafil (CIALIS) 20 MG tablet, Take 20 mg by mouth daily as needed., Disp: , Rfl:    tiZANidine (ZANAFLEX) 4 MG tablet, TAKE 1 TABLET (4 MG TOTAL) BY MOUTH 2 (TWO) TIMES DAILY., Disp: 180 tablet, Rfl: 1   TraZODone HCl 150 MG TB24, Take by mouth at bedtime., Disp: , Rfl:    VITAMIN E PO, Take 90 Units by mouth daily., Disp: , Rfl:    Assessment and Plan:  1. Chronic pain syndrome   2. Long term current use of  opiate analgesic   3. Musculoskeletal pain   4. Sciatica of left side associated with disorder of lumbosacral spine   5. DDD (degenerative disc disease), lumbar   6. Neurogenic pain   7. Lumbar spondylosis with myelopathy   On our discussion today and after review of the [facility] practitioner database information it is appropriate to continue his current medication management.  Refills will be given for his opioids for 1024 and 1123.  He continues to do well and derive good functional benefits from chronic opioid therapy.  He is doing stretching and strengthening exercises as tolerated but continues to have the sciatica symptoms and has responded favorably to epidurals in the past and we will schedule a repeat epidural in approximately 2 to 3 weeks.  No other changes are made in his pain management protocol today and have encouraged him to continue follow-up with his primary care physicians for baseline medical care. Follow Up Instructions:    I discussed the assessment and treatment plan with the patient. The patient was provided an opportunity to ask questions and all were answered. The patient agreed with the plan and demonstrated an understanding of the instructions.   The patient was advised to call back or seek an in-person evaluation if the symptoms worsen or if the condition fails to improve as anticipated.  I provided 30 minutes of non-face-to-face time during this encounter.   Molli Barrows, MD

## 2021-05-19 ENCOUNTER — Other Ambulatory Visit: Payer: Self-pay | Admitting: Anesthesiology

## 2021-05-19 ENCOUNTER — Telehealth: Payer: Self-pay

## 2021-05-19 NOTE — Telephone Encounter (Signed)
Called patient to go over pre procedure instructions.  Patient states he is no longer on the Eliquis.  Instructed not to eat or drink for 8 hours and to bring a driver.  Patient states understanding. OK to schedule when approved.

## 2021-05-26 ENCOUNTER — Other Ambulatory Visit: Payer: Self-pay

## 2021-05-26 ENCOUNTER — Encounter: Payer: Self-pay | Admitting: Ophthalmology

## 2021-06-02 NOTE — Discharge Instructions (Signed)

## 2021-06-09 ENCOUNTER — Ambulatory Visit
Admission: RE | Admit: 2021-06-09 | Discharge: 2021-06-09 | Disposition: A | Discharge status: Home / Self Care | Payer: Medicare Other | Attending: Ophthalmology | Admitting: Ophthalmology

## 2021-06-09 ENCOUNTER — Ambulatory Visit: Payer: Medicare Other | Admitting: Anesthesiology

## 2021-06-09 ENCOUNTER — Encounter
Admission: RE | Disposition: A | Discharge status: Home / Self Care | Payer: Self-pay | Source: Home / Self Care | Attending: Ophthalmology

## 2021-06-09 ENCOUNTER — Other Ambulatory Visit: Payer: Self-pay

## 2021-06-09 ENCOUNTER — Encounter: Payer: Self-pay | Admitting: Ophthalmology

## 2021-06-09 DIAGNOSIS — Z955 Presence of coronary angioplasty implant and graft: Secondary | ICD-10-CM | POA: Insufficient documentation

## 2021-06-09 DIAGNOSIS — E1136 Type 2 diabetes mellitus with diabetic cataract: Secondary | ICD-10-CM | POA: Diagnosis not present

## 2021-06-09 DIAGNOSIS — Z6830 Body mass index (BMI) 30.0-30.9, adult: Secondary | ICD-10-CM | POA: Insufficient documentation

## 2021-06-09 DIAGNOSIS — I110 Hypertensive heart disease with heart failure: Secondary | ICD-10-CM | POA: Diagnosis not present

## 2021-06-09 DIAGNOSIS — H2511 Age-related nuclear cataract, right eye: Secondary | ICD-10-CM | POA: Insufficient documentation

## 2021-06-09 DIAGNOSIS — E669 Obesity, unspecified: Secondary | ICD-10-CM | POA: Diagnosis not present

## 2021-06-09 DIAGNOSIS — Z87891 Personal history of nicotine dependence: Secondary | ICD-10-CM | POA: Insufficient documentation

## 2021-06-09 DIAGNOSIS — I509 Heart failure, unspecified: Secondary | ICD-10-CM | POA: Diagnosis not present

## 2021-06-09 HISTORY — PX: CATARACT EXTRACTION W/PHACO: SHX586

## 2021-06-09 LAB — GLUCOSE, CAPILLARY
Glucose-Capillary: 127 mg/dL — ABNORMAL HIGH (ref 70–99)
Glucose-Capillary: 138 mg/dL — ABNORMAL HIGH (ref 70–99)

## 2021-06-09 SURGERY — PHACOEMULSIFICATION, CATARACT, WITH IOL INSERTION
Anesthesia: Monitor Anesthesia Care | Site: Eye | Laterality: Right

## 2021-06-09 MED ORDER — FENTANYL CITRATE (PF) 100 MCG/2ML IJ SOLN
INTRAMUSCULAR | Status: DC | PRN
  Administered 2021-06-09: 50 ug via INTRAVENOUS

## 2021-06-09 MED ORDER — MIDAZOLAM HCL 2 MG/2ML IJ SOLN
INTRAMUSCULAR | Status: DC | PRN
  Administered 2021-06-09: 1 mg via INTRAVENOUS

## 2021-06-09 MED ORDER — SIGHTPATH DOSE#1 BSS IO SOLN
INTRAOCULAR | Status: DC | PRN
  Administered 2021-06-09: 15 mL

## 2021-06-09 MED ORDER — OXYCODONE HCL 5 MG/5ML PO SOLN: 5.0000 mg | Freq: Once | ORAL | Status: DC | PRN

## 2021-06-09 MED ORDER — LACTATED RINGERS IV SOLN: INTRAVENOUS | Status: DC

## 2021-06-09 MED ORDER — SIGHTPATH DOSE#1 SODIUM HYALURONATE 10 MG/ML IO SOLUTION
PREFILLED_SYRINGE | INTRAOCULAR | Status: DC | PRN
  Administered 2021-06-09: 0.85 mL via INTRAOCULAR

## 2021-06-09 MED ORDER — OXYCODONE HCL 5 MG PO TABS: 5.0000 mg | ORAL_TABLET | Freq: Once | ORAL | Status: DC | PRN

## 2021-06-09 MED ORDER — ARMC OPHTHALMIC DILATING DROPS
1.0000 "application " | OPHTHALMIC | Status: DC | PRN
  Administered 2021-06-09 (×3): 1 via OPHTHALMIC

## 2021-06-09 MED ORDER — TETRACAINE HCL 0.5 % OP SOLN
1.0000 [drp] | OPHTHALMIC | Status: DC | PRN
  Administered 2021-06-09 (×3): 1 [drp] via OPHTHALMIC

## 2021-06-09 MED ORDER — CEFUROXIME OPHTHALMIC INJECTION 1 MG/0.1 ML
INJECTION | OPHTHALMIC | Status: DC | PRN
  Administered 2021-06-09: 0.1 mL via OPHTHALMIC

## 2021-06-09 MED ORDER — SIGHTPATH DOSE#1 BSS IO SOLN
INTRAOCULAR | Status: DC | PRN
  Administered 2021-06-09: 64 mL via OPHTHALMIC

## 2021-06-09 MED ORDER — LIDOCAINE HCL (PF) 2 % IJ SOLN
INTRAOCULAR | Status: DC | PRN
  Administered 2021-06-09: 1 mL via INTRAOCULAR

## 2021-06-09 MED ORDER — SIGHTPATH DOSE#1 SODIUM HYALURONATE 23 MG/ML IO SOLUTION
PREFILLED_SYRINGE | INTRAOCULAR | Status: DC | PRN
  Administered 2021-06-09: 0.6 mL via INTRAOCULAR

## 2021-06-09 SURGICAL SUPPLY — 15 items
CANNULA ANT/CHMB 27G (MISCELLANEOUS) IMPLANT
CANNULA ANT/CHMB 27GA (MISCELLANEOUS) ×2 IMPLANT
DISSECTOR HYDRO NUCLEUS 50X22 (MISCELLANEOUS) ×2 IMPLANT
GLOVE SURG GAMMEX PI TX LF 7.5 (GLOVE) ×2 IMPLANT
GLOVE SURG SYN 8.5  E (GLOVE) ×2
GLOVE SURG SYN 8.5 E (GLOVE) ×1 IMPLANT
GLOVE SURG SYN 8.5 PF PI (GLOVE) ×1 IMPLANT
GOWN STRL REUS W/ TWL LRG LVL3 (GOWN DISPOSABLE) ×2 IMPLANT
GOWN STRL REUS W/TWL LRG LVL3 (GOWN DISPOSABLE) ×4
LENS IOL TECNIS EYHANCE 20.5 (Intraocular Lens) ×1 IMPLANT
PACK EYE AFTER SURG (MISCELLANEOUS) ×2 IMPLANT
SYR 3ML LL SCALE MARK (SYRINGE) ×2 IMPLANT
SYR TB 1ML LUER SLIP (SYRINGE) ×2 IMPLANT
WATER STERILE IRR 250ML POUR (IV SOLUTION) ×2 IMPLANT
WIPE NON LINTING 3.25X3.25 (MISCELLANEOUS) ×2 IMPLANT

## 2021-06-09 NOTE — Anesthesia Preprocedure Evaluation (Signed)
Anesthesia Evaluation  Patient identified by MRN, date of birth, ID band Patient awake    Reviewed: NPO status   History of Anesthesia Complications Negative for: history of anesthetic complications  Airway Mallampati: II  TM Distance: >3 FB Neck ROM: full    Dental  (+) Missing, Chipped,    Pulmonary neg pulmonary ROS, former smoker,    Pulmonary exam normal        Cardiovascular Exercise Tolerance: Good hypertension, + CAD and + Cardiac Stents (x2 2010)  Normal cardiovascular exam     Neuro/Psych Depression Chronic pain; DDD; Sciatica of left;    GI/Hepatic Neg liver ROS, GERD  Controlled,  Endo/Other  diabetesMorbid obesity (bmi 31)  Renal/GU negative Renal ROS  negative genitourinary   Musculoskeletal  (+) Arthritis ,   Abdominal   Peds  Hematology negative hematology ROS (+)   Anesthesia Other Findings Last aspirin: 11/13  Reproductive/Obstetrics                             Anesthesia Physical Anesthesia Plan  ASA: 3  Anesthesia Plan: MAC   Post-op Pain Management:    Induction:   PONV Risk Score and Plan: 1 and Midazolam  Airway Management Planned:   Additional Equipment:   Intra-op Plan:   Post-operative Plan:   Informed Consent: I have reviewed the patients History and Physical, chart, labs and discussed the procedure including the risks, benefits and alternatives for the proposed anesthesia with the patient or authorized representative who has indicated his/her understanding and acceptance.       Plan Discussed with: CRNA  Anesthesia Plan Comments:         Anesthesia Quick Evaluation

## 2021-06-09 NOTE — Anesthesia Procedure Notes (Signed)
Procedure Name: MAC Date/Time: 06/09/2021 8:03 AM Performed by: Cameron Ali, CRNA Pre-anesthesia Checklist: Patient identified, Emergency Drugs available, Suction available, Timeout performed and Patient being monitored Patient Re-evaluated:Patient Re-evaluated prior to induction Oxygen Delivery Method: Nasal cannula Placement Confirmation: positive ETCO2

## 2021-06-09 NOTE — Transfer of Care (Signed)
Immediate Anesthesia Transfer of Care Note  Patient: Peter Lamb  Procedure(s) Performed: CATARACT EXTRACTION PHACO AND INTRAOCULAR LENS PLACEMENT (IOC) RIGHT DIABETIC (Right: Eye)  Patient Location: PACU  Anesthesia Type: MAC  Level of Consciousness: awake, alert  and patient cooperative  Airway and Oxygen Therapy: Patient Spontanous Breathing and Patient connected to supplemental oxygen  Post-op Assessment: Post-op Vital signs reviewed, Patient's Cardiovascular Status Stable, Respiratory Function Stable, Patent Airway and No signs of Nausea or vomiting  Post-op Vital Signs: Reviewed and stable  Complications: No notable events documented.

## 2021-06-09 NOTE — H&P (Signed)
[facility]   Primary Care Physician:  [place] Pink, MD Ophthalmologist: Dr. Benay Pillow  Pre-Procedure History & Physical: HPI:  Peter Lamb is a 65 y.o. male here for cataract surgery.   Past Medical History:  Diagnosis Date   Arthritis    back, neck   Carpal boss of right wrist    CHF (congestive heart failure) (HCC)    Depression    Diabetes mellitus without complication (HCC)    GERD (gastroesophageal reflux disease)    H/O tooth extraction    Hyperlipidemia    Hypertension     Past Surgical History:  Procedure Laterality Date   APPENDECTOMY     CARPAL TUNNEL RELEASE Right 02/16/2018   Procedure: CARPAL TUNNEL RELEASE ENDOSCOPIC;  Surgeon: Corky Mull, MD;  Location: [place];  Service: Orthopedics;  Laterality: Right;  Diabetic - oral meds   CORONARY ANGIOPLASTY  2010   no stent - [facility]   CORONARY ANGIOPLASTY WITH STENT PLACEMENT  1996 /2003    Prior to Admission medications   Medication Sig Start Date End Date Taking? Authorizing Provider  aspirin 81 MG tablet Take 162 mg by mouth daily.   Yes [provider]  glucosamine-chondroitin 500-400 MG tablet Take 1 tablet by mouth daily.   Yes [provider]  lisinopril (ZESTRIL) 10 MG tablet Take 10 mg by mouth daily.   Yes [provider]  Melatonin 10 MG TABS Take by mouth at bedtime as needed.   Yes [provider]  metFORMIN (GLUCOPHAGE) 500 MG tablet Take 500 mg by mouth daily with breakfast. Reported on 02/10/2016   Yes [provider]  Multiple Vitamins-Minerals (VITAMINS TO GO MEN PO) Take 1 tablet by mouth daily.    Yes [provider]  Omega-3 Fatty Acids (FISH OIL) 1000 MG CAPS Take by mouth daily.   Yes [provider]  oxyCODONE ER (XTAMPZA ER) 36 MG C12A Take 2 capsules (72 mg total) by mouth 2 (two) times daily. 05/19/21 06/18/21 Yes Molli Barrows, MD  rosuvastatin (CRESTOR) 10 MG tablet Take 10 mg by mouth daily.   Yes  [provider]  tiZANidine (ZANAFLEX) 4 MG tablet TAKE 1 TABLET BY MOUTH 2 TIMES DAILY. 05/19/21  Yes Molli Barrows, MD  TraZODone HCl 150 MG TB24 Take by mouth at bedtime.   Yes [provider]  apixaban (ELIQUIS) 5 MG TABS tablet TAKE ONE TABLET BY MOUTH TWICE A DAY (CAUTION: BLOOD THINNER) Patient not taking: Reported on 05/26/2021 09/06/20   [provider]  atenolol (TENORMIN) 100 MG tablet Take 50 mg by mouth 2 (two) times daily. Patient not taking: Reported on 05/26/2021    [provider]  diltiazem (CARDIZEM CD) 240 MG 24 hr capsule Take 240 mg by mouth daily. Patient not taking: Reported on 05/26/2021    [provider]  glipiZIDE (GLUCOTROL) 10 MG tablet Take 10 mg 2 (two) times daily by mouth. Patient not taking: Reported on 05/26/2021 03/22/17   [provider]  nitroGLYCERIN (NITROSTAT) 0.4 MG SL tablet Place 1 tablet under tongue as needed for chest pain (may repeat every 5 minutes but seek medical help if pain persists after 3 tablets) as needed 12/26/09   [provider]  oxyCODONE ER (XTAMPZA ER) 36 MG C12A Take 2 capsules (72 mg total) by mouth 2 (two) times daily. 06/18/21 07/18/21  Molli Barrows, MD  tadalafil (CIALIS) 20 MG tablet Take 20 mg by mouth daily as needed.  08/01/19   [provider]  VITAMIN E PO Take 90 Units by mouth daily. Patient not taking: Reported on 05/26/2021    [provider]    Allergies as of 05/21/2021   (No Known Allergies)    Family History  Problem Relation Age of Onset   Arthritis Mother    Cancer Mother    Diabetes Mother    Varicose Veins Mother    Alcohol abuse Father    Heart disease Father    Hypertension Father    Asthma Daughter    Drug abuse Daughter    Stroke Maternal Grandmother    Arthritis Maternal Grandfather    Cancer Maternal Grandfather    Diabetes Maternal Grandfather    Alcohol abuse Brother    Depression Brother    Diabetes Brother     Early death Brother    Heart disease Brother    Hypertension Brother    Kidney disease Brother    Mental illness Maternal Aunt     Social History   Socioeconomic History   Marital status: Married    Spouse name: Not on file   Number of children: Not on file   Years of education: Not on file   Highest education level: Not on file  Occupational History   Not on file  Tobacco Use   Smoking status: Former    Types: Cigarettes    Quit date: 02/12/1994    Years since quitting: 27.3   Smokeless tobacco: Former  Scientific laboratory technician Use: Never used  Substance and Sexual Activity   Alcohol use: No    Alcohol/week: 0.0 standard drinks   Drug use: No   Sexual activity: Not on file  Other Topics Concern   Not on file  Social History Narrative   Not on file   Social Determinants of Health   Financial Resource Strain: Not on file  Food Insecurity: Not on file  Transportation Needs: Not on file  Physical Activity: Not on file  Stress: Not on file  Social Connections: Not on file  Intimate Partner Violence: Not on file    Review of Systems: See HPI, otherwise negative ROS  Physical Exam: BP 139/78   Pulse (!) 50   Temp 97.9 F (36.6 C) (Temporal)   Ht 5\' 10"  (1.778 m)   Wt 97.2 kg   SpO2 97%   BMI 30.75 kg/m  General:   Alert, cooperative in NAD Head:  Normocephalic and atraumatic. Respiratory:  Normal work of breathing. Cardiovascular:  RRR  Impression/Plan: Peter Lamb is here for cataract surgery.  Risks, benefits, limitations, and alternatives regarding cataract surgery have been reviewed with the patient.  Questions have been answered.  All parties agreeable.   Benay Pillow, MD  06/09/2021, 8:25 AM

## 2021-06-09 NOTE — Op Note (Signed)
OPERATIVE NOTE  RIC ROSENBERG [id number] 06/09/2021   PREOPERATIVE DIAGNOSIS:  Nuclear sclerotic cataract right eye.  H25.11   POSTOPERATIVE DIAGNOSIS:    Nuclear sclerotic cataract right eye.     PROCEDURE:  Phacoemusification with posterior chamber intraocular lens placement of the right eye   LENS:   Implant Name Type Inv. Item Serial No. Manufacturer Lot No. LRB No. Used Action  LENS IOL TECNIS EYHANCE 20.5 - [id number] Intraocular Lens LENS IOL TECNIS EYHANCE 20.5 [id number] JOHNSON   Right 1 Implanted       Procedure(s) with comments: CATARACT EXTRACTION PHACO AND INTRAOCULAR LENS PLACEMENT (IOC) RIGHT DIABETIC (Right) - 4.04 00:29.6  DIB00 +20.5   SURGEON:  Benay Pillow, MD, MPH  ANESTHESIOLOGIST: Anesthesiologist: Fidel Levy, MD CRNA: Cameron Ali, CRNA   ANESTHESIA:  Topical with tetracaine drops augmented with 1% preservative-free intracameral lidocaine.  ESTIMATED BLOOD LOSS: less than 1 mL.   COMPLICATIONS:  None.   DESCRIPTION OF PROCEDURE:  The patient was identified in the holding room and transported to the operating room and placed in the supine position under the operating microscope.  The right eye was identified as the operative eye and it was prepped and draped in the usual sterile ophthalmic fashion.   A 1.0 millimeter clear-corneal paracentesis was made at the 10:30 position. 0.5 ml of preservative-free 1% lidocaine with epinephrine was injected into the anterior chamber.  The anterior chamber was filled with Healon 5 viscoelastic.  A 2.4 millimeter keratome was used to make a near-clear corneal incision at the 8:00 position.  A curvilinear capsulorrhexis was made with a cystotome and capsulorrhexis forceps.  Balanced salt solution was used to hydrodissect and hydrodelineate the nucleus.   Phacoemulsification was then used in stop and chop fashion to remove the lens nucleus and epinucleus.  The remaining cortex was then removed using the  irrigation and aspiration handpiece. Healon was then placed into the capsular bag to distend it for lens placement.  A lens was then injected into the capsular bag.  The remaining viscoelastic was aspirated.   Wounds were hydrated with balanced salt solution.  The anterior chamber was inflated to a physiologic pressure with balanced salt solution.   Intracameral cefuroxime 0.1 mL at 10 mg/mL was injected into the eye.  No wound leaks were noted.  The patient was taken to the recovery room in stable condition without complications of anesthesia or surgery  Benay Pillow 06/09/2021, 8:52 AM

## 2021-06-09 NOTE — Anesthesia Postprocedure Evaluation (Signed)
Anesthesia Post Note  Patient: Peter Lamb  Procedure(s) Performed: CATARACT EXTRACTION PHACO AND INTRAOCULAR LENS PLACEMENT (IOC) RIGHT DIABETIC (Right: Eye)     Patient location during evaluation: PACU Anesthesia Type: MAC Level of consciousness: awake and alert Pain management: pain level controlled Vital Signs Assessment: post-procedure vital signs reviewed and stable Respiratory status: spontaneous breathing, nonlabored ventilation, respiratory function stable and patient connected to nasal cannula oxygen Cardiovascular status: stable and blood pressure returned to baseline Postop Assessment: no apparent nausea or vomiting Anesthetic complications: no   No notable events documented.  Fidel Levy

## 2021-06-10 ENCOUNTER — Encounter: Payer: Self-pay | Admitting: Ophthalmology

## 2021-06-16 ENCOUNTER — Other Ambulatory Visit: Payer: Self-pay | Admitting: Anesthesiology

## 2021-06-16 ENCOUNTER — Other Ambulatory Visit: Payer: Self-pay

## 2021-06-16 ENCOUNTER — Encounter: Payer: Self-pay | Admitting: Anesthesiology

## 2021-06-16 ENCOUNTER — Ambulatory Visit
Admission: RE | Admit: 2021-06-16 | Discharge: 2021-06-16 | Disposition: A | Discharge status: Home / Self Care | Payer: Medicare Other | Source: Ambulatory Visit | Attending: Anesthesiology | Admitting: Anesthesiology

## 2021-06-16 ENCOUNTER — Ambulatory Visit (HOSPITAL_BASED_OUTPATIENT_CLINIC_OR_DEPARTMENT_OTHER): Payer: Medicare Other | Admitting: Anesthesiology

## 2021-06-16 VITALS — BP 161/94 | HR 60 | Temp 97.3°F | Resp 15 | Ht 70.0 in | Wt 210.0 lb

## 2021-06-16 DIAGNOSIS — G894 Chronic pain syndrome: Secondary | ICD-10-CM

## 2021-06-16 DIAGNOSIS — M4716 Other spondylosis with myelopathy, lumbar region: Secondary | ICD-10-CM | POA: Diagnosis present

## 2021-06-16 DIAGNOSIS — M5387 Other specified dorsopathies, lumbosacral region: Secondary | ICD-10-CM | POA: Diagnosis present

## 2021-06-16 DIAGNOSIS — M5136 Other intervertebral disc degeneration, lumbar region: Secondary | ICD-10-CM

## 2021-06-16 DIAGNOSIS — R52 Pain, unspecified: Secondary | ICD-10-CM | POA: Diagnosis present

## 2021-06-16 DIAGNOSIS — Z79891 Long term (current) use of opiate analgesic: Secondary | ICD-10-CM

## 2021-06-16 DIAGNOSIS — M7918 Myalgia, other site: Secondary | ICD-10-CM | POA: Insufficient documentation

## 2021-06-16 DIAGNOSIS — G8929 Other chronic pain: Secondary | ICD-10-CM | POA: Insufficient documentation

## 2021-06-16 DIAGNOSIS — M5442 Lumbago with sciatica, left side: Secondary | ICD-10-CM | POA: Diagnosis present

## 2021-06-16 MED ORDER — SODIUM CHLORIDE 0.9% FLUSH
10.0000 mL | Freq: Once | INTRAVENOUS | Status: AC
  Administered 2021-06-16: 10 mL

## 2021-06-16 MED ORDER — XTAMPZA ER 36 MG PO C12A: 2.0000 | EXTENDED_RELEASE_CAPSULE | Freq: Two times a day (BID) | ORAL | 0 refills | Status: DC

## 2021-06-16 MED ORDER — LIDOCAINE HCL (PF) 1 % IJ SOLN
5.0000 mL | Freq: Once | INTRAMUSCULAR | Status: AC
  Administered 2021-06-16: 5 mL via SUBCUTANEOUS

## 2021-06-16 MED ORDER — XTAMPZA ER 36 MG PO C12A: 2.0000 | EXTENDED_RELEASE_CAPSULE | Freq: Two times a day (BID) | ORAL | 0 refills | Status: AC

## 2021-06-16 MED ORDER — SODIUM CHLORIDE (PF) 0.9 % IJ SOLN
INTRAMUSCULAR | Status: AC
  Filled 2021-06-16: qty 10

## 2021-06-16 MED ORDER — TRIAMCINOLONE ACETONIDE 40 MG/ML IJ SUSP
40.0000 mg | Freq: Once | INTRAMUSCULAR | Status: AC
  Administered 2021-06-16: 40 mg

## 2021-06-16 MED ORDER — ROPIVACAINE HCL 2 MG/ML IJ SOLN
10.0000 mL | Freq: Once | INTRAMUSCULAR | Status: AC
  Administered 2021-06-16: 10 mL via EPIDURAL

## 2021-06-16 MED ORDER — ROPIVACAINE HCL 2 MG/ML IJ SOLN
INTRAMUSCULAR | Status: AC
  Filled 2021-06-16: qty 20

## 2021-06-16 MED ORDER — TIZANIDINE HCL 4 MG PO TABS
4.0000 mg | ORAL_TABLET | Freq: Two times a day (BID) | ORAL | 3 refills | Status: DC
Start: 2021-06-16 — End: 2022-02-27

## 2021-06-16 MED ORDER — LIDOCAINE HCL (PF) 1 % IJ SOLN
INTRAMUSCULAR | Status: AC
  Filled 2021-06-16: qty 10

## 2021-06-16 MED ORDER — IOHEXOL 180 MG/ML  SOLN
10.0000 mL | Freq: Once | INTRAMUSCULAR | Status: AC | PRN
  Administered 2021-06-16: 5 mL via EPIDURAL

## 2021-06-16 MED ORDER — TRIAMCINOLONE ACETONIDE 40 MG/ML IJ SUSP
INTRAMUSCULAR | Status: AC
  Filled 2021-06-16: qty 1

## 2021-06-16 NOTE — Progress Notes (Signed)
Nursing Pain Medication Assessment:  Safety precautions to be maintained throughout the outpatient stay will include: orient to surroundings, keep bed in low position, maintain call bell within reach at all times, provide assistance with transfer out of bed and ambulation.  Medication Inspection Compliance: Pill count conducted under aseptic conditions, in front of the patient. Neither the pills nor the bottle was removed from the patient's sight at any time. Once count was completed pills were immediately returned to the patient in their original bottle.  Medication:  xtampza Pill/Patch Count:  6 of 120 pills remain Pill/Patch Appearance: Markings consistent with prescribed medication Bottle Appearance: Standard pharmacy container. Clearly labeled. Filled Date: 10 / 24 / 2022 Last Medication intake:  Today

## 2021-06-16 NOTE — Progress Notes (Signed)
Subjective:  Patient ID: Peter Lamb, male    DOB: 1955-10-24  Age: 65 y.o. MRN: [id number]  CC: No chief complaint on file.   Procedure: L5-S1 epidural under fluoroscopic guidance with no sedation  HPI Peter Lamb presents for reevaluation.  He was last seen few months ago and continues to have low back pain with radiation into the bilateral lower extremities left greater than right.  The quality characteristic and distribution of this is similar to what he had in the past.  In the past he is also had epidurals periodically to keep the pain under control.  If Peter Lamb does over the course of 2 to 3 months following his epidurals.  Generally gets about 75 to 80% relief of his low back pain and leg pain for about a month or 2 with gradual recurrence of this pain.  He has had previous epidurals in November of this year and December of last year.  His response was as previously mentioned.  He also takes his medications for chronic opioid therapy and these keep the pain in his low back and legs in addition to knee pain under good control.  He sleeps better with the medications and denies any side effects.  Otherwise he is in his usual state of health desiring to proceed with a repeat epidural today.  Outpatient Medications Prior to Visit  Medication Sig Dispense Refill   glucosamine-chondroitin 500-400 MG tablet Take 1 tablet by mouth daily.     lisinopril (ZESTRIL) 10 MG tablet Take 10 mg by mouth daily.     Melatonin 10 MG TABS Take by mouth at bedtime as needed.     metFORMIN (GLUCOPHAGE) 500 MG tablet Take 500 mg by mouth daily with breakfast. Reported on 02/10/2016     Multiple Vitamins-Minerals (VITAMINS TO GO MEN PO) Take 1 tablet by mouth daily.      Omega-3 Fatty Acids (FISH OIL) 1000 MG CAPS Take by mouth daily.     rosuvastatin (CRESTOR) 10 MG tablet Take 10 mg by mouth daily.     tadalafil (CIALIS) 20 MG tablet Take 20 mg by mouth daily as needed.     TraZODone HCl 150 MG TB24  Take by mouth at bedtime.     oxyCODONE ER (XTAMPZA ER) 36 MG C12A Take 2 capsules (72 mg total) by mouth 2 (two) times daily. 120 capsule 0   [START ON 06/18/2021] oxyCODONE ER (XTAMPZA ER) 36 MG C12A Take 2 capsules (72 mg total) by mouth 2 (two) times daily. 120 capsule 0   tiZANidine (ZANAFLEX) 4 MG tablet TAKE 1 TABLET BY MOUTH 2 TIMES DAILY. 180 tablet 1   apixaban (ELIQUIS) 5 MG TABS tablet TAKE ONE TABLET BY MOUTH TWICE A DAY (CAUTION: BLOOD THINNER) (Patient not taking: Reported on 06/16/2021)     aspirin 81 MG tablet Take 162 mg by mouth daily. (Patient not taking: Reported on 06/16/2021)     atenolol (TENORMIN) 100 MG tablet Take 50 mg by mouth 2 (two) times daily. (Patient not taking: Reported on 05/26/2021)     diltiazem (CARDIZEM CD) 240 MG 24 hr capsule Take 240 mg by mouth daily. (Patient not taking: Reported on 05/26/2021)     glipiZIDE (GLUCOTROL) 10 MG tablet Take 10 mg 2 (two) times daily by mouth. (Patient not taking: Reported on 05/26/2021)  3   nitroGLYCERIN (NITROSTAT) 0.4 MG SL tablet Place 1 tablet under tongue as needed for chest pain (may repeat every 5 minutes but seek medical  help if pain persists after 3 tablets) as needed (Patient not taking: Reported on 06/16/2021)     VITAMIN E PO Take 90 Units by mouth daily. (Patient not taking: Reported on 05/26/2021)     No facility-administered medications prior to visit.    Review of Systems CNS: No confusion or sedation Cardiac: No angina or palpitations GI: No abdominal pain or constipation Constitutional: No nausea vomiting fevers or chills  Objective:  BP (!) 161/94   Pulse 60 Comment: sb  Temp (!) 97.3 F (36.3 C)   Resp 15   Ht 5\' 10"  (1.778 m)   Wt 210 lb (95.3 kg)   SpO2 99%   BMI 30.13 kg/m    BP Readings from Last 3 Encounters:  06/16/21 (!) 161/94  06/09/21 119/76  11/11/20 126/81     Wt Readings from Last 3 Encounters:  06/16/21 210 lb (95.3 kg)  06/09/21 214 lb 4.8 oz (97.2 kg)  11/11/20  200 lb (90.7 kg)     Physical Exam Pt is alert and oriented PERRL EOMI HEART IS RRR no murmur or rub LCTA no wheezing or rales MUSCULOSKELETAL reveals some muscular tenderness in the low back but no overt trigger points are noted.  He walks with an antalgic gait but his lower extremity muscle tone and bulk is at baseline.  Strength appears to be at baseline as well.  Labs  Lab Results  Component Value Date   HGBA1C 9.0 03/04/2010   Lab Results  Component Value Date   CREATININE 0.92 08/21/2016    -------------------------------------------------------------------------------------------------------------------- Lab Results  Component Value Date   WBC 9.2 08/21/2016   HGB 15.3 08/21/2016   HCT 43.6 08/21/2016   PLT 219 08/21/2016   GLUCOSE 132 (H) 08/21/2016   ALT 31 03/04/2010   AST 23 03/04/2010   NA 135 08/21/2016   K 3.7 08/21/2016   CL 100 (L) 08/21/2016   CREATININE 0.92 08/21/2016   BUN 11 08/21/2016   CO2 26 08/21/2016   PSA 0.26 03/04/2010   HGBA1C 9.0 03/04/2010    --------------------------------------------------------------------------------------------------------------------- DG PAIN CLINIC C-ARM 1-60 MIN NO REPORT  Result Date: 06/16/2021 Fluoro was used, but no Radiologist interpretation will be provided. Please refer to "NOTES" tab for provider progress note.    Assessment & Plan:   Diagnoses and all orders for this visit:  Chronic pain syndrome  Sciatica of left side associated with disorder of lumbosacral spine -     Lumbar Epidural Injection  DDD (degenerative disc disease), lumbar -     Lumbar Epidural Injection  Lumbar spondylosis with myelopathy -     Lumbar Epidural Injection  Long term current use of opiate analgesic  Musculoskeletal pain  Chronic left-sided low back pain with left-sided sciatica  Other orders -     triamcinolone acetonide (KENALOG-40) injection 40 mg -     sodium chloride flush (NS) 0.9 % injection 10  mL -     ropivacaine (PF) 2 mg/mL (0.2%) (NAROPIN) injection 10 mL -     lidocaine (PF) (XYLOCAINE) 1 % injection 5 mL -     iohexol (OMNIPAQUE) 180 MG/ML injection 10 mL -     oxyCODONE ER (XTAMPZA ER) 36 MG C12A; Take 2 capsules (72 mg total) by mouth 2 (two) times daily. -     oxyCODONE ER (XTAMPZA ER) 36 MG C12A; Take 2 capsules (72 mg total) by mouth 2 (two) times daily. -     tiZANidine (ZANAFLEX) 4 MG tablet; Take 1 tablet (  4 mg total) by mouth 2 (two) times daily.        ----------------------------------------------------------------------------------------------------------------------  Problem List Items Addressed This Visit       Unprioritized   Chronic low back pain (Chronic)   Relevant Medications   oxyCODONE ER (XTAMPZA ER) 36 MG C12A (Start on 06/18/2021)   oxyCODONE ER (XTAMPZA ER) 36 MG C12A (Start on 07/18/2021)   tiZANidine (ZANAFLEX) 4 MG tablet   Chronic pain - Primary (Chronic)   Relevant Medications   oxyCODONE ER (XTAMPZA ER) 36 MG C12A (Start on 06/18/2021)   oxyCODONE ER (XTAMPZA ER) 36 MG C12A (Start on 07/18/2021)   tiZANidine (ZANAFLEX) 4 MG tablet   Long term current use of opiate analgesic (Chronic)   Musculoskeletal pain (Chronic)   DDD (degenerative disc disease), lumbar   Relevant Medications   oxyCODONE ER (XTAMPZA ER) 36 MG C12A (Start on 06/18/2021)   oxyCODONE ER (XTAMPZA ER) 36 MG C12A (Start on 07/18/2021)   tiZANidine (ZANAFLEX) 4 MG tablet   Sciatica of left side associated with disorder of lumbosacral spine   Relevant Medications   tiZANidine (ZANAFLEX) 4 MG tablet   Other Visit Diagnoses     Lumbar spondylosis with myelopathy       Relevant Medications   triamcinolone acetonide (KENALOG-40) injection 40 mg (Completed)   oxyCODONE ER (XTAMPZA ER) 36 MG C12A (Start on 06/18/2021)   oxyCODONE ER (XTAMPZA ER) 36 MG C12A (Start on 07/18/2021)   tiZANidine (ZANAFLEX) 4 MG tablet          ----------------------------------------------------------------------------------------------------------------------  1. Sciatica of left side associated with disorder of lumbosacral spine We will proceed with a repeat epidural as reviewed today.  The risks and benefits of been reviewed in full detail all questions are answered.  We will schedule for a return in 2 months for medication management and probably an epidural at the 23-month mark as reviewed today. - Lumbar Epidural Injection  2. DDD (degenerative disc disease), lumbar As above - Lumbar Epidural Injection  3. Lumbar spondylosis with myelopathy As above with continued efforts at stretching strengthening and weight loss - Lumbar Epidural Injection  4. Chronic pain syndrome I have reviewed the [facility] practitioner database information and it is appropriate for refills.  These will be dated for December 23 and November 23 no other changes will be made  5. Long term current use of opiate analgesic As above  6. Musculoskeletal pain Continue stretching strengthening exercises  7. Chronic left-sided low back pain with left-sided sciatica Lumbar epidural today ----------------------------------------------------------------------------------------------------------------------  I have changed Peter Lamb's tiZANidine. I am also having him maintain his aspirin, metFORMIN, atenolol, diltiazem, Multiple Vitamins-Minerals (VITAMINS TO GO MEN PO), rosuvastatin, TraZODone HCl, glipiZIDE, nitroGLYCERIN, Fish Oil, VITAMIN E PO, Melatonin, glucosamine-chondroitin, tadalafil, apixaban, lisinopril, Xtampza ER, and Xtampza ER. We administered triamcinolone acetonide, sodium chloride flush, ropivacaine (PF) 2 mg/mL (0.2%), lidocaine (PF), and iohexol.   Meds ordered this encounter  Medications   triamcinolone acetonide (KENALOG-40) injection 40 mg   sodium chloride flush (NS) 0.9 % injection 10 mL   ropivacaine (PF) 2  mg/mL (0.2%) (NAROPIN) injection 10 mL   lidocaine (PF) (XYLOCAINE) 1 % injection 5 mL   iohexol (OMNIPAQUE) 180 MG/ML injection 10 mL   oxyCODONE ER (XTAMPZA ER) 36 MG C12A    Sig: Take 2 capsules (72 mg total) by mouth 2 (two) times daily.    Dispense:  120 capsule    Refill:  0   oxyCODONE ER (XTAMPZA ER) 36  MG C12A    Sig: Take 2 capsules (72 mg total) by mouth 2 (two) times daily.    Dispense:  120 capsule    Refill:  0   tiZANidine (ZANAFLEX) 4 MG tablet    Sig: Take 1 tablet (4 mg total) by mouth 2 (two) times daily.    Dispense:  60 tablet    Refill:  3   Patient's Medications  New Prescriptions   No medications on file  Previous Medications   APIXABAN (ELIQUIS) 5 MG TABS TABLET    TAKE ONE TABLET BY MOUTH TWICE A DAY (CAUTION: BLOOD THINNER)   ASPIRIN 81 MG TABLET    Take 162 mg by mouth daily.   ATENOLOL (TENORMIN) 100 MG TABLET    Take 50 mg by mouth 2 (two) times daily.   DILTIAZEM (CARDIZEM CD) 240 MG 24 HR CAPSULE    Take 240 mg by mouth daily.   GLIPIZIDE (GLUCOTROL) 10 MG TABLET    Take 10 mg 2 (two) times daily by mouth.   GLUCOSAMINE-CHONDROITIN 500-400 MG TABLET    Take 1 tablet by mouth daily.   LISINOPRIL (ZESTRIL) 10 MG TABLET    Take 10 mg by mouth daily.   MELATONIN 10 MG TABS    Take by mouth at bedtime as needed.   METFORMIN (GLUCOPHAGE) 500 MG TABLET    Take 500 mg by mouth daily with breakfast. Reported on 02/10/2016   MULTIPLE VITAMINS-MINERALS (VITAMINS TO GO MEN PO)    Take 1 tablet by mouth daily.    NITROGLYCERIN (NITROSTAT) 0.4 MG SL TABLET    Place 1 tablet under tongue as needed for chest pain (may repeat every 5 minutes but seek medical help if pain persists after 3 tablets) as needed   OMEGA-3 FATTY ACIDS (FISH OIL) 1000 MG CAPS    Take by mouth daily.   ROSUVASTATIN (CRESTOR) 10 MG TABLET    Take 10 mg by mouth daily.   TADALAFIL (CIALIS) 20 MG TABLET    Take 20 mg by mouth daily as needed.   TRAZODONE HCL 150 MG TB24    Take by mouth at  bedtime.   VITAMIN E PO    Take 90 Units by mouth daily.  Modified Medications   Modified Medication Previous Medication   OXYCODONE ER (XTAMPZA ER) 36 MG C12A oxyCODONE ER (XTAMPZA ER) 36 MG C12A      Take 2 capsules (72 mg total) by mouth 2 (two) times daily.    Take 2 capsules (72 mg total) by mouth 2 (two) times daily.   OXYCODONE ER (XTAMPZA ER) 36 MG C12A oxyCODONE ER (XTAMPZA ER) 36 MG C12A      Take 2 capsules (72 mg total) by mouth 2 (two) times daily.    Take 2 capsules (72 mg total) by mouth 2 (two) times daily.   TIZANIDINE (ZANAFLEX) 4 MG TABLET tiZANidine (ZANAFLEX) 4 MG tablet      Take 1 tablet (4 mg total) by mouth 2 (two) times daily.    TAKE 1 TABLET BY MOUTH 2 TIMES DAILY.  Discontinued Medications   No medications on file   ----------------------------------------------------------------------------------------------------------------------  Follow-up: Return in about 2 months (around 08/16/2021) for evaluation, med refill.   Procedure: L5-S1 LESI with fluoroscopic guidance and no moderate sedation  NOTE: The risks, benefits, and expectations of the procedure have been discussed and explained to the patient who was understanding and in agreement with suggested treatment plan. No guarantees were made.  DESCRIPTION OF  PROCEDURE: Lumbar epidural steroid injection with no IV Versed, EKG, blood pressure, pulse, and pulse oximetry monitoring. The procedure was performed with the patient in the prone position under fluoroscopic guidance.  Sterile prep x3 was initiated and I then injected subcutaneous lidocaine to the overlying L5-S1 site after its fluoroscopic identifictation.  Using strict aseptic technique, I then advanced an 18-gauge Tuohy epidural needle in the midline using interlaminar approach via loss-of-resistance to saline technique. There was negative aspiration for heme or  CSF.  I then confirmed position with both AP and Lateral fluoroscan.  2 cc of contrast dye were  injected and a  total of 5 mL of Preservative-Free normal saline mixed with 40 mg of Kenalog and 1cc Ropicaine 0.2 percent were injected incrementally via the  epidurally placed needle. The needle was removed. The patient tolerated the injection well and was convalesced and discharged to home in stable condition. Should the patient have any post procedure difficulty they have been instructed on how to contact us for assistance.    Molli Barrows, MD

## 2021-06-16 NOTE — Patient Instructions (Signed)

## 2021-06-17 ENCOUNTER — Telehealth: Payer: Self-pay

## 2021-06-17 NOTE — Telephone Encounter (Signed)
Post procedure phone call.  LM 

## 2021-07-01 NOTE — Discharge Instructions (Signed)

## 2021-07-07 ENCOUNTER — Ambulatory Visit
Admission: RE | Admit: 2021-07-07 | Discharge: 2021-07-07 | Disposition: A | Discharge status: Home / Self Care | Payer: Medicare Other | Attending: Ophthalmology | Admitting: Ophthalmology

## 2021-07-07 ENCOUNTER — Encounter
Admission: RE | Disposition: A | Discharge status: Home / Self Care | Payer: Self-pay | Source: Home / Self Care | Attending: Ophthalmology

## 2021-07-07 ENCOUNTER — Ambulatory Visit: Payer: Medicare Other | Admitting: Anesthesiology

## 2021-07-07 ENCOUNTER — Other Ambulatory Visit: Payer: Self-pay

## 2021-07-07 ENCOUNTER — Encounter: Payer: Self-pay | Admitting: Ophthalmology

## 2021-07-07 DIAGNOSIS — Z6831 Body mass index (BMI) 31.0-31.9, adult: Secondary | ICD-10-CM | POA: Diagnosis not present

## 2021-07-07 DIAGNOSIS — H2512 Age-related nuclear cataract, left eye: Secondary | ICD-10-CM | POA: Insufficient documentation

## 2021-07-07 DIAGNOSIS — E1136 Type 2 diabetes mellitus with diabetic cataract: Secondary | ICD-10-CM | POA: Diagnosis not present

## 2021-07-07 DIAGNOSIS — Z955 Presence of coronary angioplasty implant and graft: Secondary | ICD-10-CM | POA: Insufficient documentation

## 2021-07-07 DIAGNOSIS — K219 Gastro-esophageal reflux disease without esophagitis: Secondary | ICD-10-CM | POA: Insufficient documentation

## 2021-07-07 DIAGNOSIS — I10 Essential (primary) hypertension: Secondary | ICD-10-CM | POA: Insufficient documentation

## 2021-07-07 DIAGNOSIS — I2510 Atherosclerotic heart disease of native coronary artery without angina pectoris: Secondary | ICD-10-CM | POA: Insufficient documentation

## 2021-07-07 DIAGNOSIS — Z87891 Personal history of nicotine dependence: Secondary | ICD-10-CM | POA: Diagnosis not present

## 2021-07-07 HISTORY — PX: CATARACT EXTRACTION W/PHACO: SHX586

## 2021-07-07 LAB — GLUCOSE, CAPILLARY
Glucose-Capillary: 111 mg/dL — ABNORMAL HIGH (ref 70–99)
Glucose-Capillary: 131 mg/dL — ABNORMAL HIGH (ref 70–99)

## 2021-07-07 SURGERY — PHACOEMULSIFICATION, CATARACT, WITH IOL INSERTION
Anesthesia: Monitor Anesthesia Care | Site: Eye | Laterality: Left

## 2021-07-07 MED ORDER — TETRACAINE HCL 0.5 % OP SOLN
1.0000 [drp] | OPHTHALMIC | Status: DC | PRN
  Administered 2021-07-07 (×3): 1 [drp] via OPHTHALMIC

## 2021-07-07 MED ORDER — LACTATED RINGERS IV SOLN: INTRAVENOUS | Status: DC

## 2021-07-07 MED ORDER — MOXIFLOXACIN HCL 0.5 % OP SOLN
OPHTHALMIC | Status: DC | PRN
  Administered 2021-07-07: 0.2 mL via OPHTHALMIC

## 2021-07-07 MED ORDER — MIDAZOLAM HCL 2 MG/2ML IJ SOLN
INTRAMUSCULAR | Status: DC | PRN
  Administered 2021-07-07: 2 mg via INTRAVENOUS

## 2021-07-07 MED ORDER — SIGHTPATH DOSE#1 BSS IO SOLN
INTRAOCULAR | Status: DC | PRN
  Administered 2021-07-07: 15 mL

## 2021-07-07 MED ORDER — LIDOCAINE HCL (PF) 2 % IJ SOLN
INTRAOCULAR | Status: DC | PRN
  Administered 2021-07-07: 1 mL via INTRAOCULAR

## 2021-07-07 MED ORDER — ARMC OPHTHALMIC DILATING DROPS
1.0000 "application " | OPHTHALMIC | Status: DC | PRN
  Administered 2021-07-07 (×3): 1 via OPHTHALMIC

## 2021-07-07 MED ORDER — SIGHTPATH DOSE#1 SODIUM HYALURONATE 23 MG/ML IO SOLUTION
PREFILLED_SYRINGE | INTRAOCULAR | Status: DC | PRN
  Administered 2021-07-07: 0.6 mL via INTRAOCULAR

## 2021-07-07 MED ORDER — SIGHTPATH DOSE#1 SODIUM HYALURONATE 10 MG/ML IO SOLUTION
PREFILLED_SYRINGE | INTRAOCULAR | Status: DC | PRN
  Administered 2021-07-07: 0.85 mL via INTRAOCULAR

## 2021-07-07 MED ORDER — SIGHTPATH DOSE#1 BSS IO SOLN
INTRAOCULAR | Status: DC | PRN
  Administered 2021-07-07: 73 mL via OPHTHALMIC

## 2021-07-07 MED ORDER — FENTANYL CITRATE (PF) 100 MCG/2ML IJ SOLN
INTRAMUSCULAR | Status: DC | PRN
  Administered 2021-07-07: 50 ug via INTRAVENOUS

## 2021-07-07 SURGICAL SUPPLY — 20 items
CANNULA ANT/CHMB 27G (MISCELLANEOUS) ×2 IMPLANT
CANNULA ANT/CHMB 27GA (MISCELLANEOUS) ×2 IMPLANT
DISSECTOR HYDRO NUCLEUS 50X22 (MISCELLANEOUS) ×3 IMPLANT
GLOVE SURG GAMMEX PI TX LF 7.5 (GLOVE) ×3 IMPLANT
GLOVE SURG SYN 8.5  E (GLOVE) ×2
GLOVE SURG SYN 8.5 E (GLOVE) ×1 IMPLANT
GLOVE SURG SYN 8.5 PF PI (GLOVE) ×2 IMPLANT
GOWN STRL REUS W/ TWL LRG LVL3 (GOWN DISPOSABLE) ×4 IMPLANT
GOWN STRL REUS W/TWL LRG LVL3 (GOWN DISPOSABLE) ×4
LENS IOL TECNIS EYHANCE 21.0 (Intraocular Lens) ×1 IMPLANT
MARKER SKIN DUAL TIP RULER LAB (MISCELLANEOUS) ×3 IMPLANT
NDL FILTER BLUNT 18X1 1/2 (NEEDLE) ×2 IMPLANT
NEEDLE FILTER BLUNT 18X 1/2SAF (NEEDLE) ×1
NEEDLE FILTER BLUNT 18X1 1/2 (NEEDLE) ×1 IMPLANT
PACK EYE AFTER SURG (MISCELLANEOUS) ×3 IMPLANT
SYR 3ML LL SCALE MARK (SYRINGE) ×3 IMPLANT
SYR 5ML LL (SYRINGE) ×3 IMPLANT
SYR TB 1ML LUER SLIP (SYRINGE) ×3 IMPLANT
WATER STERILE IRR 250ML POUR (IV SOLUTION) ×3 IMPLANT
WIPE NON LINTING 3.25X3.25 (MISCELLANEOUS) ×3 IMPLANT

## 2021-07-07 NOTE — H&P (Signed)
[facility]   Primary Care Physician:  [place] Pink, MD Ophthalmologist: Dr. Benay Pillow  Pre-Procedure History & Physical: HPI:  Peter Lamb is a 65 y.o. male here for cataract surgery.   Past Medical History:  Diagnosis Date   Arthritis    back, neck   Carpal boss of right wrist    CHF (congestive heart failure) (HCC)    Depression    Diabetes mellitus without complication (HCC)    GERD (gastroesophageal reflux disease)    H/O tooth extraction    Hyperlipidemia    Hypertension     Past Surgical History:  Procedure Laterality Date   APPENDECTOMY     CARPAL TUNNEL RELEASE Right 02/16/2018   Procedure: CARPAL TUNNEL RELEASE ENDOSCOPIC;  Surgeon: Corky Mull, MD;  Location: [place];  Service: Orthopedics;  Laterality: Right;  Diabetic - oral meds   CATARACT EXTRACTION W/PHACO Right 06/09/2021   Procedure: CATARACT EXTRACTION PHACO AND INTRAOCULAR LENS PLACEMENT ([place]) RIGHT DIABETIC;  Surgeon: Eulogio Bear, MD;  Location: [place];  Service: Ophthalmology;  Laterality: Right;  4.04 00:29.6   CORONARY ANGIOPLASTY  2010   no stent - [facility]   CORONARY ANGIOPLASTY WITH STENT PLACEMENT  1996 /2003    Prior to Admission medications   Medication Sig Start Date End Date Taking? Authorizing Provider  glucosamine-chondroitin 500-400 MG tablet Take 1 tablet by mouth daily.   Yes [provider]  lisinopril (ZESTRIL) 10 MG tablet Take 10 mg by mouth daily.   Yes [provider]  Melatonin 10 MG TABS Take by mouth at bedtime as needed.   Yes [provider]  metFORMIN (GLUCOPHAGE) 500 MG tablet Take 500 mg by mouth daily with breakfast. Reported on 02/10/2016   Yes [provider]  Multiple Vitamins-Minerals (VITAMINS TO GO MEN PO) Take 1 tablet by mouth daily.    Yes [provider]  Omega-3 Fatty Acids (FISH OIL) 1000 MG CAPS Take by mouth daily.   Yes [provider]  oxyCODONE ER (XTAMPZA  ER) 36 MG C12A Take 2 capsules (72 mg total) by mouth 2 (two) times daily. 06/18/21 07/18/21 Yes Molli Barrows, MD  rosuvastatin (CRESTOR) 10 MG tablet Take 10 mg by mouth daily.   Yes [provider]  tadalafil (CIALIS) 20 MG tablet Take 20 mg by mouth daily as needed. 08/01/19  Yes [provider]  tiZANidine (ZANAFLEX) 4 MG tablet Take 1 tablet (4 mg total) by mouth 2 (two) times daily. 06/16/21 07/16/21 Yes Molli Barrows, MD  TraZODone HCl 150 MG TB24 Take by mouth at bedtime.   Yes [provider]  apixaban (ELIQUIS) 5 MG TABS tablet TAKE ONE TABLET BY MOUTH TWICE A DAY (CAUTION: BLOOD THINNER) Patient not taking: Reported on 06/16/2021 09/06/20   [provider]  aspirin 81 MG tablet Take 162 mg by mouth daily. Patient not taking: Reported on 06/16/2021    [provider]  atenolol (TENORMIN) 100 MG tablet Take 50 mg by mouth 2 (two) times daily. Patient not taking: Reported on 05/26/2021    [provider]  diltiazem (CARDIZEM CD) 240 MG 24 hr capsule Take 240 mg by mouth daily. Patient not taking: Reported on 05/26/2021    [provider]  glipiZIDE (GLUCOTROL) 10 MG tablet Take 10 mg 2 (two) times daily by mouth. Patient not taking: Reported on 05/26/2021 03/22/17   [provider]  nitroGLYCERIN (NITROSTAT) 0.4 MG SL tablet Place 1 tablet under  tongue as needed for chest pain (may repeat every 5 minutes but seek medical help if pain persists after 3 tablets) as needed Patient not taking: Reported on 06/16/2021 12/26/09   [provider]  oxyCODONE ER (XTAMPZA ER) 36 MG C12A Take 2 capsules (72 mg total) by mouth 2 (two) times daily. 07/18/21 08/17/21  Molli Barrows, MD  VITAMIN E PO Take 90 Units by mouth daily. Patient not taking: Reported on 05/26/2021    [provider]    Allergies as of 06/24/2021   (No Known Allergies)    Family History  Problem Relation Age of Onset   Arthritis Mother     Cancer Mother    Diabetes Mother    Varicose Veins Mother    Alcohol abuse Father    Heart disease Father    Hypertension Father    Asthma Daughter    Drug abuse Daughter    Stroke Maternal Grandmother    Arthritis Maternal Grandfather    Cancer Maternal Grandfather    Diabetes Maternal Grandfather    Alcohol abuse Brother    Depression Brother    Diabetes Brother    Early death Brother    Heart disease Brother    Hypertension Brother    Kidney disease Brother    Mental illness Maternal Aunt     Social History   Socioeconomic History   Marital status: Married    Spouse name: Not on file   Number of children: Not on file   Years of education: Not on file   Highest education level: Not on file  Occupational History   Not on file  Tobacco Use   Smoking status: Former    Types: Cigarettes    Quit date: 02/12/1994    Years since quitting: 27.4   Smokeless tobacco: Former  Scientific laboratory technician Use: Never used  Substance and Sexual Activity   Alcohol use: No    Alcohol/week: 0.0 standard drinks   Drug use: No   Sexual activity: Not on file  Other Topics Concern   Not on file  Social History Narrative   Not on file   Social Determinants of Health   Financial Resource Strain: Not on file  Food Insecurity: Not on file  Transportation Needs: Not on file  Physical Activity: Not on file  Stress: Not on file  Social Connections: Not on file  Intimate Partner Violence: Not on file    Review of Systems: See HPI, otherwise negative ROS  Physical Exam: BP 135/77   Pulse (!) 56   Temp (!) 97.2 F (36.2 C)   Wt 96.6 kg   SpO2 95%   BMI 30.56 kg/m  General:   Alert, cooperative in NAD Head:  Normocephalic and atraumatic. Respiratory:  Normal work of breathing. Cardiovascular:  RRR  Impression/Plan: Peter Lamb is here for cataract surgery.  Risks, benefits, limitations, and alternatives regarding cataract surgery have been reviewed with the patient.   Questions have been answered.  All parties agreeable.   Benay Pillow, MD  07/07/2021, 7:57 AM

## 2021-07-07 NOTE — Anesthesia Postprocedure Evaluation (Signed)
Anesthesia Post Note  Patient: Peter Lamb  Procedure(s) Performed: CATARACT EXTRACTION PHACO AND INTRAOCULAR LENS PLACEMENT ([facility]) LEFT DIABETIC (Left: Eye)     Patient location during evaluation: PACU Anesthesia Type: MAC Level of consciousness: awake and alert Pain management: pain level controlled Vital Signs Assessment: post-procedure vital signs reviewed and stable Respiratory status: spontaneous breathing, nonlabored ventilation and respiratory function stable Cardiovascular status: stable and blood pressure returned to baseline Postop Assessment: no apparent nausea or vomiting Anesthetic complications: no   No notable events documented.  April Manson

## 2021-07-07 NOTE — Anesthesia Preprocedure Evaluation (Signed)
Anesthesia Evaluation  Patient identified by MRN, date of birth, ID band Patient awake    Reviewed: NPO status   History of Anesthesia Complications Negative for: history of anesthetic complications  Airway Mallampati: II  TM Distance: >3 FB Neck ROM: full    Dental  (+) Missing, Chipped,    Pulmonary neg pulmonary ROS, former smoker,    Pulmonary exam normal        Cardiovascular Exercise Tolerance: Good hypertension, + CAD and + Cardiac Stents (x2 2010)  Normal cardiovascular exam     Neuro/Psych Depression Chronic pain; DDD; Sciatica of left;    GI/Hepatic Neg liver ROS, GERD  Controlled,  Endo/Other  diabetesMorbid obesity (bmi 31)  Renal/GU negative Renal ROS  negative genitourinary   Musculoskeletal  (+) Arthritis ,   Abdominal   Peds  Hematology negative hematology ROS (+)   Anesthesia Other Findings Last aspirin: 11/13  Reproductive/Obstetrics                             Anesthesia Physical  Anesthesia Plan  ASA: 3  Anesthesia Plan: MAC   Post-op Pain Management:    Induction:   PONV Risk Score and Plan: 1 and Midazolam and TIVA  Airway Management Planned:   Additional Equipment:   Intra-op Plan:   Post-operative Plan:   Informed Consent: I have reviewed the patients History and Physical, chart, labs and discussed the procedure including the risks, benefits and alternatives for the proposed anesthesia with the patient or authorized representative who has indicated his/her understanding and acceptance.       Plan Discussed with: CRNA  Anesthesia Plan Comments:         Anesthesia Quick Evaluation

## 2021-07-07 NOTE — Op Note (Signed)
OPERATIVE NOTE  Peter Lamb [id number] 07/07/2021   PREOPERATIVE DIAGNOSIS:  Nuclear sclerotic cataract left eye.  H25.12   POSTOPERATIVE DIAGNOSIS:    Nuclear sclerotic cataract left eye.     PROCEDURE:  Phacoemusification with posterior chamber intraocular lens placement of the left eye   LENS:   Implant Name Type Inv. Item Serial No. Manufacturer Lot No. LRB No. Used Action  LENS IOL TECNIS EYHANCE 21.0 - [id number] Intraocular Lens LENS IOL TECNIS EYHANCE 21.0 [id number] JOHNSON   Left 1 Implanted      Procedure(s) with comments: CATARACT EXTRACTION PHACO AND INTRAOCULAR LENS PLACEMENT (IOC) LEFT DIABETIC (Left) - 1.75 00:20.9  DIB00 +21.0   SURGEON:  Benay Pillow, MD, MPH   ANESTHESIA:  Topical with tetracaine drops augmented with 1% preservative-free intracameral lidocaine.  ESTIMATED BLOOD LOSS: <1 mL   COMPLICATIONS:  None.   DESCRIPTION OF PROCEDURE:  The patient was identified in the holding room and transported to the operating room and placed in the supine position under the operating microscope.  The left eye was identified as the operative eye and it was prepped and draped in the usual sterile ophthalmic fashion.   A 1.0 millimeter clear-corneal paracentesis was made at the 5:00 position. 0.5 ml of preservative-free 1% lidocaine with epinephrine was injected into the anterior chamber.  The anterior chamber was filled with Healon 5 viscoelastic.  A 2.4 millimeter keratome was used to make a near-clear corneal incision at the 2:00 position.  A curvilinear capsulorrhexis was made with a cystotome and capsulorrhexis forceps.  Balanced salt solution was used to hydrodissect and hydrodelineate the nucleus.   Phacoemulsification was then used in stop and chop fashion to remove the lens nucleus and epinucleus.  The remaining cortex was then removed using the irrigation and aspiration handpiece. Healon was then placed into the capsular bag to distend it for lens  placement.  A lens was then injected into the capsular bag.  The remaining viscoelastic was aspirated.   Wounds were hydrated with balanced salt solution.  The anterior chamber was inflated to a physiologic pressure with balanced salt solution.  Intracameral vigamox 0.1 mL undiltued was injected into the eye and a drop placed onto the ocular surface.  No wound leaks were noted.  The patient was taken to the recovery room in stable condition without complications of anesthesia or surgery  Benay Pillow 07/07/2021, 8:30 AM

## 2021-07-07 NOTE — Transfer of Care (Signed)
Immediate Anesthesia Transfer of Care Note  Patient: Peter Lamb  Procedure(s) Performed: CATARACT EXTRACTION PHACO AND INTRAOCULAR LENS PLACEMENT (IOC) LEFT DIABETIC (Left: Eye)  Patient Location: PACU  Anesthesia Type: MAC  Level of Consciousness: awake, alert  and patient cooperative  Airway and Oxygen Therapy: Patient Spontanous Breathing and Patient connected to supplemental oxygen  Post-op Assessment: Post-op Vital signs reviewed, Patient's Cardiovascular Status Stable, Respiratory Function Stable, Patent Airway and No signs of Nausea or vomiting  Post-op Vital Signs: Reviewed and stable  Complications: No notable events documented.

## 2021-07-08 ENCOUNTER — Encounter: Payer: Self-pay | Admitting: Ophthalmology

## 2021-08-11 ENCOUNTER — Other Ambulatory Visit: Payer: Self-pay

## 2021-08-11 ENCOUNTER — Ambulatory Visit: Payer: Medicare Other | Admitting: Anesthesiology

## 2021-08-14 ENCOUNTER — Ambulatory Visit: Payer: Medicare Other | Attending: Anesthesiology | Admitting: Anesthesiology

## 2021-08-14 ENCOUNTER — Encounter: Payer: Self-pay | Admitting: Anesthesiology

## 2021-08-14 ENCOUNTER — Other Ambulatory Visit: Payer: Self-pay

## 2021-08-14 DIAGNOSIS — M5136 Other intervertebral disc degeneration, lumbar region: Secondary | ICD-10-CM

## 2021-08-14 DIAGNOSIS — M47816 Spondylosis without myelopathy or radiculopathy, lumbar region: Secondary | ICD-10-CM

## 2021-08-14 DIAGNOSIS — G894 Chronic pain syndrome: Secondary | ICD-10-CM

## 2021-08-14 DIAGNOSIS — Z79891 Long term (current) use of opiate analgesic: Secondary | ICD-10-CM

## 2021-08-14 DIAGNOSIS — M5387 Other specified dorsopathies, lumbosacral region: Secondary | ICD-10-CM | POA: Diagnosis not present

## 2021-08-14 DIAGNOSIS — M7918 Myalgia, other site: Secondary | ICD-10-CM

## 2021-08-14 DIAGNOSIS — G8929 Other chronic pain: Secondary | ICD-10-CM

## 2021-08-14 DIAGNOSIS — M4716 Other spondylosis with myelopathy, lumbar region: Secondary | ICD-10-CM | POA: Diagnosis not present

## 2021-08-14 DIAGNOSIS — M5442 Lumbago with sciatica, left side: Secondary | ICD-10-CM

## 2021-08-14 MED ORDER — XTAMPZA ER 36 MG PO C12A: 2.0000 | EXTENDED_RELEASE_CAPSULE | Freq: Two times a day (BID) | ORAL | 0 refills | Status: AC

## 2021-08-14 MED ORDER — XTAMPZA ER 36 MG PO C12A: 2.0000 | EXTENDED_RELEASE_CAPSULE | Freq: Two times a day (BID) | ORAL | 0 refills | Status: DC

## 2021-08-14 NOTE — Progress Notes (Signed)
Virtual Visit via Telephone Note  I connected with Peter Lamb on 08/14/21 at 12:15 PM EST by telephone and verified that I am speaking with the correct person using two identifiers.  Location: Patient: Home Provider: Pain control center   I discussed the limitations, risks, security and privacy concerns of performing an evaluation and management service by telephone and the availability of in person appointments. I also discussed with the patient that there may be a patient responsible charge related to this service. The patient expressed understanding and agreed to proceed.   History of Present Illness: I spoke with Peter Lamb today via telephone.  He was unable link for the video portion of this conference and reports that he is actually been doing quite well recently he continues to take his Xtampza ER 2 tablets twice a day and this is keeping his pain under good control.  His low back pain has been stable in severity no change in quality or frequency are noted.  He staying active trying to walk and do his stretching exercises.  He last had an epidural back in November and this relieved his sciatica symptoms up until about a week or so ago.  He generally has these about every 3 months and would like to follow-up in February for repeat epidural he reports.  Unfortunately more conservative therapy with home stretching and physical therapy and medication management alone have been ineffective in keeping the sciatica symptoms under control.  He has responded favorably to epidurals though his medications do give him significant improvement.  No other changes in bowel bladder function or lower extremity strength or function are reported at this time.  Review of systems: General: No fevers or chills Pulmonary: No shortness of breath or dyspnea Cardiac: No angina or palpitations or lightheadedness GI: No abdominal pain or constipation Psych: No depression    Observations/Objective:  Current  Outpatient Medications:    [START ON 09/16/2021] oxyCODONE ER (XTAMPZA ER) 36 MG C12A, Take 2 capsules (72 mg total) by mouth 2 (two) times daily., Disp: 120 capsule, Rfl: 0   apixaban (ELIQUIS) 5 MG TABS tablet, TAKE ONE TABLET BY MOUTH TWICE A DAY (CAUTION: BLOOD THINNER) (Patient not taking: Reported on 06/16/2021), Disp: , Rfl:    aspirin 81 MG tablet, Take 162 mg by mouth daily. (Patient not taking: Reported on 06/16/2021), Disp: , Rfl:    atenolol (TENORMIN) 100 MG tablet, Take 50 mg by mouth 2 (two) times daily. (Patient not taking: Reported on 05/26/2021), Disp: , Rfl:    diltiazem (CARDIZEM CD) 240 MG 24 hr capsule, Take 240 mg by mouth daily. (Patient not taking: Reported on 05/26/2021), Disp: , Rfl:    glipiZIDE (GLUCOTROL) 10 MG tablet, Take 10 mg 2 (two) times daily by mouth. (Patient not taking: Reported on 05/26/2021), Disp: , Rfl: 3   glucosamine-chondroitin 500-400 MG tablet, Take 1 tablet by mouth daily., Disp: , Rfl:    lisinopril (ZESTRIL) 10 MG tablet, Take 10 mg by mouth daily., Disp: , Rfl:    Melatonin 10 MG TABS, Take by mouth at bedtime as needed., Disp: , Rfl:    metFORMIN (GLUCOPHAGE) 500 MG tablet, Take 500 mg by mouth daily with breakfast. Reported on 02/10/2016, Disp: , Rfl:    Multiple Vitamins-Minerals (VITAMINS TO GO MEN PO), Take 1 tablet by mouth daily. , Disp: , Rfl:    nitroGLYCERIN (NITROSTAT) 0.4 MG SL tablet, Place 1 tablet under tongue as needed for chest pain (may repeat every 5 minutes but  seek medical help if pain persists after 3 tablets) as needed (Patient not taking: Reported on 06/16/2021), Disp: , Rfl:    Omega-3 Fatty Acids (FISH OIL) 1000 MG CAPS, Take by mouth daily., Disp: , Rfl:    [START ON 08/16/2021] oxyCODONE ER (XTAMPZA ER) 36 MG C12A, Take 2 capsules (72 mg total) by mouth 2 (two) times daily., Disp: 120 capsule, Rfl: 0   rosuvastatin (CRESTOR) 10 MG tablet, Take 10 mg by mouth daily., Disp: , Rfl:    tadalafil (CIALIS) 20 MG tablet, Take 20  mg by mouth daily as needed., Disp: , Rfl:    tiZANidine (ZANAFLEX) 4 MG tablet, Take 1 tablet (4 mg total) by mouth 2 (two) times daily., Disp: 60 tablet, Rfl: 3   TraZODone HCl 150 MG TB24, Take by mouth at bedtime., Disp: , Rfl:    VITAMIN E PO, Take 90 Units by mouth daily. (Patient not taking: Reported on 05/26/2021), Disp: , Rfl:   Past Medical History:  Diagnosis Date   Arthritis    back, neck   Carpal boss of right wrist    CHF (congestive heart failure) ([place])    Depression    Diabetes mellitus without complication (HCC)    GERD (gastroesophageal reflux disease)    H/O tooth extraction    Hyperlipidemia    Hypertension     Assessment and Plan:  1. Sciatica of left side associated with disorder of lumbosacral spine   2. DDD (degenerative disc disease), lumbar   3. Lumbar spondylosis with myelopathy   4. Chronic pain syndrome   5. Long term current use of opiate analgesic   6. Musculoskeletal pain   7. Chronic left-sided low back pain with left-sided sciatica   8. Facet arthritis of lumbar region   Based on her discussion today I think is appropriate for refills for his Xtampza ER.  This be for January 22 and February 21.  No other change in his pharmacologic regimen will be initiated at this time.  I want him to continue with the stretching strengthening exercises as reviewed and we will schedule him for a 1 month return for a repeat epidural for therapeutic purpose to see if we can help with the sciatica symptoms that are recurrent in nature.  Continue follow-up with his primary care physicians for baseline medical care. Follow Up Instructions:    I discussed the assessment and treatment plan with the patient. The patient was provided an opportunity to ask questions and all were answered. The patient agreed with the plan and demonstrated an understanding of the instructions.   The patient was advised to call back or seek an in-person evaluation if the symptoms worsen or if  the condition fails to improve as anticipated.  I provided pain control center 30 minutes of non-face-to-face time during this encounter.   Molli Barrows, MD

## 2021-08-14 NOTE — Patient Instructions (Addendum)
______________________________________________________________________ ° °Preparing for your procedure (without sedation) ° °Procedure appointments are limited to planned procedures: °No Prescription Refills. °No disability issues will be discussed. °No medication changes will be discussed. ° °Instructions: °Oral Intake: Do not eat or drink anything for at least 6 hours prior to your procedure. (Exception: Blood Pressure Medication. See below.) °Transportation: Unless otherwise stated by your physician, you may drive yourself after the procedure. °Blood Pressure Medicine: Do not forget to take your blood pressure medicine with a sip of water the morning of the procedure. If your Diastolic (lower reading)is above 100 mmHg, elective cases will be cancelled/rescheduled. °Blood thinners: These will need to be stopped for procedures. Notify our staff if you are taking any blood thinners. Depending on which one you take, there will be specific instructions on how and when to stop it. °Diabetics on insulin: Notify the staff so that you can be scheduled 1st case in the morning. If your diabetes requires high dose insulin, take only ½ of your normal insulin dose the morning of the procedure and notify the staff that you have done so. °Preventing infections: Shower with an antibacterial soap the morning of your procedure.  °Build-up your immune system: Take 1000 mg of Vitamin C with every meal (3 times a day) the day prior to your procedure. °Antibiotics: Inform the staff if you have a condition or reason that requires you to take antibiotics before dental procedures. °Pregnancy: If you are pregnant, call and cancel the procedure. °Sickness: If you have a cold, fever, or any active infections, call and cancel the procedure. °Arrival: You must be in the facility at least 30 minutes prior to your scheduled procedure. °Children: Do not bring any children with you. °Dress appropriately: Bring dark clothing that you would not mind  if they get stained. °Valuables: Do not bring any jewelry or valuables. ° °Reasons to call and reschedule or cancel your procedure: (Following these recommendations will minimize the risk of a serious complication.) °Surgeries: Avoid having procedures within 2 weeks of any surgery. (Avoid for 2 weeks before or after any surgery). °Flu Shots: Avoid having procedures within 2 weeks of a flu shots or . (Avoid for 2 weeks before or after immunizations). °Barium: Avoid having a procedure within 7-10 days after having had a radiological study involving the use of radiological contrast. (Myelograms, Barium swallow or enema study). °Heart attacks: Avoid any elective procedures or surgeries for the initial 6 months after a "Myocardial Infarction" (Heart Attack). °Blood thinners: It is imperative that you stop these medications before procedures. Let us know if you if you take any blood thinner.  °Infection: Avoid procedures during or within two weeks of an infection (including chest colds or gastrointestinal problems). Symptoms associated with infections include: Localized redness, fever, chills, night sweats or profuse sweating, burning sensation when voiding, cough, congestion, stuffiness, runny nose, sore throat, diarrhea, nausea, vomiting, cold or Flu symptoms, recent or current infections. It is specially important if the infection is over the area that we intend to treat. °Heart and lung problems: Symptoms that may suggest an active cardiopulmonary problem include: cough, chest pain, breathing difficulties or shortness of breath, dizziness, ankle swelling, uncontrolled high or unusually low blood pressure, and/or palpitations. If you are experiencing any of these symptoms, cancel your procedure and contact your primary care physician for an evaluation. ° °Remember:  °Regular Business hours are:  °Monday to Thursday 8:00 AM to 4:00 PM ° °Provider's Schedule: °Francisco Naveira, MD:  °Procedure days: Tuesday and Thursday    7:30 AM to 4:00 PM  Gillis Santa, MD:  Procedure days: Monday and Wednesday 7:30 AM to 4:00 PM  Stop Eliquis 3 days prior to procedure. ______________________________________________________________________

## 2021-09-15 ENCOUNTER — Telehealth: Payer: Self-pay | Admitting: Anesthesiology

## 2021-09-15 MED ORDER — XTAMPZA ER 36 MG PO C12A: 2.0000 | EXTENDED_RELEASE_CAPSULE | Freq: Two times a day (BID) | ORAL | 0 refills | Status: DC

## 2021-09-15 NOTE — Telephone Encounter (Signed)
Patient notified that script has been sent to pharmacy

## 2021-09-15 NOTE — Addendum Note (Signed)
Addended by: Molli Barrows on: 09/15/2021 02:17 PM   Modules accepted: Orders

## 2021-09-15 NOTE — Telephone Encounter (Signed)
The date to be filled is incorrect. Will discuss with Dr. Andree Elk later today.Patient notified.

## 2021-10-02 ENCOUNTER — Ambulatory Visit (HOSPITAL_BASED_OUTPATIENT_CLINIC_OR_DEPARTMENT_OTHER): Payer: Medicare Other | Admitting: Anesthesiology

## 2021-10-02 ENCOUNTER — Encounter: Payer: Self-pay | Admitting: Anesthesiology

## 2021-10-02 ENCOUNTER — Other Ambulatory Visit: Payer: Self-pay | Admitting: Anesthesiology

## 2021-10-02 ENCOUNTER — Other Ambulatory Visit: Payer: Self-pay

## 2021-10-02 ENCOUNTER — Ambulatory Visit
Admission: RE | Admit: 2021-10-02 | Discharge: 2021-10-02 | Disposition: A | Discharge status: Home / Self Care | Payer: Medicare Other | Source: Ambulatory Visit | Attending: Anesthesiology | Admitting: Anesthesiology

## 2021-10-02 VITALS — BP 148/95 | HR 61 | Temp 97.2°F | Resp 12 | Ht 70.0 in | Wt 210.0 lb

## 2021-10-02 DIAGNOSIS — M5387 Other specified dorsopathies, lumbosacral region: Secondary | ICD-10-CM

## 2021-10-02 DIAGNOSIS — M4716 Other spondylosis with myelopathy, lumbar region: Secondary | ICD-10-CM | POA: Diagnosis present

## 2021-10-02 DIAGNOSIS — R52 Pain, unspecified: Secondary | ICD-10-CM | POA: Diagnosis present

## 2021-10-02 DIAGNOSIS — M7918 Myalgia, other site: Secondary | ICD-10-CM

## 2021-10-02 DIAGNOSIS — M5442 Lumbago with sciatica, left side: Secondary | ICD-10-CM

## 2021-10-02 DIAGNOSIS — G8929 Other chronic pain: Secondary | ICD-10-CM | POA: Insufficient documentation

## 2021-10-02 DIAGNOSIS — M5136 Other intervertebral disc degeneration, lumbar region: Secondary | ICD-10-CM

## 2021-10-02 DIAGNOSIS — G894 Chronic pain syndrome: Secondary | ICD-10-CM | POA: Diagnosis present

## 2021-10-02 DIAGNOSIS — Z79891 Long term (current) use of opiate analgesic: Secondary | ICD-10-CM | POA: Diagnosis present

## 2021-10-02 MED ORDER — LIDOCAINE HCL (PF) 1 % IJ SOLN
5.0000 mL | Freq: Once | INTRAMUSCULAR | Status: AC
  Administered 2021-10-02: 13:00:00 5 mL via SUBCUTANEOUS

## 2021-10-02 MED ORDER — XTAMPZA ER 36 MG PO C12A: 2.0000 | EXTENDED_RELEASE_CAPSULE | Freq: Two times a day (BID) | ORAL | 0 refills | Status: DC

## 2021-10-02 MED ORDER — LIDOCAINE HCL (PF) 1 % IJ SOLN
INTRAMUSCULAR | Status: AC
  Filled 2021-10-02: qty 10

## 2021-10-02 MED ORDER — TRIAMCINOLONE ACETONIDE 40 MG/ML IJ SUSP
INTRAMUSCULAR | Status: AC
  Filled 2021-10-02: qty 1

## 2021-10-02 MED ORDER — XTAMPZA ER 36 MG PO C12A: 2.0000 | EXTENDED_RELEASE_CAPSULE | Freq: Two times a day (BID) | ORAL | 0 refills | Status: AC

## 2021-10-02 MED ORDER — ROPIVACAINE HCL 2 MG/ML IJ SOLN
10.0000 mL | Freq: Once | INTRAMUSCULAR | Status: AC
  Administered 2021-10-02: 13:00:00 10 mL via EPIDURAL

## 2021-10-02 MED ORDER — TRIAMCINOLONE ACETONIDE 40 MG/ML IJ SUSP
40.0000 mg | Freq: Once | INTRAMUSCULAR | Status: AC
  Administered 2021-10-02: 13:00:00 40 mg

## 2021-10-02 MED ORDER — IOHEXOL 180 MG/ML  SOLN
INTRAMUSCULAR | Status: AC
  Filled 2021-10-02: qty 20

## 2021-10-02 MED ORDER — SODIUM CHLORIDE (PF) 0.9 % IJ SOLN
INTRAMUSCULAR | Status: AC
  Filled 2021-10-02: qty 10

## 2021-10-02 MED ORDER — SODIUM CHLORIDE 0.9% FLUSH
10.0000 mL | Freq: Once | INTRAVENOUS | Status: AC
  Administered 2021-10-02: 13:00:00 10 mL

## 2021-10-02 MED ORDER — IOHEXOL 180 MG/ML  SOLN
10.0000 mL | Freq: Once | INTRAMUSCULAR | Status: AC | PRN
  Administered 2021-10-02: 13:00:00 10 mL via EPIDURAL

## 2021-10-02 MED ORDER — ROPIVACAINE HCL 2 MG/ML IJ SOLN
INTRAMUSCULAR | Status: AC
  Filled 2021-10-02: qty 20

## 2021-10-02 NOTE — Patient Instructions (Signed)

## 2021-10-02 NOTE — Progress Notes (Signed)
Subjective:  Patient ID: Peter Lamb, male    DOB: 11-01-1955  Age: 65 y.o. MRN: [id number]  CC: Back Pain (Left, lower)   Procedure: L5-S1 epidural steroid under fluoroscopic guidance with no sedation  HPI Peter Lamb presents for reevaluation.  He is continue to have problems with his sciatica flaring up.  This generally affects his left posterior lateral leg and left hip.  This is comparable to what he is experienced in the past and he has had epidural steroids for this.  His last one was back in April and November of last year.  He generally gets 75 to 100% relief of his sciatica symptoms and some improvement in his low back pain rated about 50%.  He has chronic low back pain and some knee pain for which he takes Xtampza 2 tablets twice a day and this continues to work well for him.  No side effects are reported.  The quality of these pains are stable without change.  He is trying to stay active and continue with stretching strengthening exercises but unfortunately the sciatica has persisted.  No other change in lower extremity strength or function is noted.  Outpatient Medications Prior to Visit  Medication Sig Dispense Refill   aspirin 81 MG tablet Take 162 mg by mouth daily.     atenolol (TENORMIN) 100 MG tablet Take 50 mg by mouth 2 (two) times daily.     glucosamine-chondroitin 500-400 MG tablet Take 1 tablet by mouth daily.     lisinopril (ZESTRIL) 10 MG tablet Take 10 mg by mouth 2 (two) times daily.     Melatonin 10 MG TABS Take by mouth at bedtime as needed.     metFORMIN (GLUCOPHAGE) 500 MG tablet Take 1,000 mg by mouth 2 (two) times daily with a meal. Reported on 02/10/2016     Multiple Vitamins-Minerals (VITAMINS TO GO MEN PO) Take 1 tablet by mouth daily.      nitroGLYCERIN (NITROSTAT) 0.4 MG SL tablet      Omega-3 Fatty Acids (FISH OIL) 1000 MG CAPS Take by mouth daily.     rosuvastatin (CRESTOR) 10 MG tablet Take 10 mg by mouth daily.     tadalafil (CIALIS) 20 MG  tablet Take 20 mg by mouth daily as needed.     TraZODone HCl 150 MG TB24 Take by mouth at bedtime.     oxyCODONE ER (XTAMPZA ER) 36 MG C12A Take 2 capsules (72 mg total) by mouth 2 (two) times daily. 120 capsule 0   apixaban (ELIQUIS) 5 MG TABS tablet TAKE ONE TABLET BY MOUTH TWICE A DAY (CAUTION: BLOOD THINNER) (Patient not taking: Reported on 06/16/2021)     diltiazem (CARDIZEM CD) 240 MG 24 hr capsule Take 240 mg by mouth daily. (Patient not taking: Reported on 05/26/2021)     glipiZIDE (GLUCOTROL) 10 MG tablet Take 10 mg 2 (two) times daily by mouth. (Patient not taking: Reported on 05/26/2021)  3   tiZANidine (ZANAFLEX) 4 MG tablet Take 1 tablet (4 mg total) by mouth 2 (two) times daily. 60 tablet 3   VITAMIN E PO Take 90 Units by mouth daily. (Patient not taking: Reported on 05/26/2021)     No facility-administered medications prior to visit.    Review of Systems CNS: No confusion or sedation Cardiac: No angina or palpitations GI: No abdominal pain or constipation Constitutional: No nausea vomiting fevers or chills  Objective:  BP (!) 148/95    Pulse 61 Comment: NSR  Temp (!) 97.2 F (36.2 C) (Temporal)    Resp 12    Ht '5\' 10"'$  (1.778 m)    Wt 210 lb (95.3 kg)    SpO2 97%    BMI 30.13 kg/m    BP Readings from Last 3 Encounters:  10/02/21 (!) 148/95  07/07/21 123/82  06/16/21 (!) 161/94     Wt Readings from Last 3 Encounters:  10/02/21 210 lb (95.3 kg)  07/07/21 213 lb (96.6 kg)  06/16/21 210 lb (95.3 kg)     Physical Exam Pt is alert and oriented PERRL EOMI HEART IS RRR no murmur or rub LCTA no wheezing or rales MUSCULOSKELETAL reveals some paraspinous muscle tenderness in the lumbar region but no overt trigger points.  He has a positive straight leg raise on the left side and equivocal on the right.  His muscle tone and bulk is at baseline strength appears to be well preserved.  Labs  Lab Results  Component Value Date   HGBA1C 9.0 03/04/2010   Lab Results   Component Value Date   CREATININE 0.92 08/21/2016    -------------------------------------------------------------------------------------------------------------------- Lab Results  Component Value Date   WBC 9.2 08/21/2016   HGB 15.3 08/21/2016   HCT 43.6 08/21/2016   PLT 219 08/21/2016   GLUCOSE 132 (H) 08/21/2016   ALT 31 03/04/2010   AST 23 03/04/2010   NA 135 08/21/2016   K 3.7 08/21/2016   CL 100 (L) 08/21/2016   CREATININE 0.92 08/21/2016   BUN 11 08/21/2016   CO2 26 08/21/2016   PSA 0.26 03/04/2010   HGBA1C 9.0 03/04/2010    --------------------------------------------------------------------------------------------------------------------- DG PAIN CLINIC C-ARM 1-60 MIN NO REPORT  Result Date: 10/02/2021 Fluoro was used, but no Radiologist interpretation will be provided. Please refer to "NOTES" tab for provider progress note.    Assessment & Plan:   Tristin was seen today for back pain.  Diagnoses and all orders for this visit:  Chronic pain syndrome  Sciatica of left side associated with disorder of lumbosacral spine -     Lumbar Epidural Injection  DDD (degenerative disc disease), lumbar -     Lumbar Epidural Injection  Lumbar spondylosis with myelopathy -     Lumbar Epidural Injection  Long term current use of opiate analgesic  Musculoskeletal pain  Chronic left-sided low back pain with left-sided sciatica  Other orders -     triamcinolone acetonide (KENALOG-40) injection 40 mg -     sodium chloride flush (NS) 0.9 % injection 10 mL -     ropivacaine (PF) 2 mg/mL (0.2%) (NAROPIN) injection 10 mL -     lidocaine (PF) (XYLOCAINE) 1 % injection 5 mL -     iohexol (OMNIPAQUE) 180 MG/ML injection 10 mL -     oxyCODONE ER (XTAMPZA ER) 36 MG C12A; Take 2 capsules (72 mg total) by mouth 2 (two) times daily. -     oxyCODONE ER (XTAMPZA ER) 36 MG C12A; Take 2 capsules (72 mg total) by mouth 2 (two) times  daily.        ----------------------------------------------------------------------------------------------------------------------  Problem List Items Addressed This Visit       Unprioritized   Chronic low back pain (Chronic)   Relevant Medications   oxyCODONE ER (XTAMPZA ER) 36 MG C12A (Start on 10/15/2021)   oxyCODONE ER (XTAMPZA ER) 36 MG C12A (Start on 11/14/2021)   Chronic pain - Primary (Chronic)   Relevant Medications   oxyCODONE ER (XTAMPZA ER) 36 MG C12A (Start on 10/15/2021)  oxyCODONE ER (XTAMPZA ER) 36 MG C12A (Start on 11/14/2021)   Long term current use of opiate analgesic (Chronic)   Musculoskeletal pain (Chronic)   DDD (degenerative disc disease), lumbar   Relevant Medications   oxyCODONE ER (XTAMPZA ER) 36 MG C12A (Start on 10/15/2021)   oxyCODONE ER (XTAMPZA ER) 56 MG C12A (Start on 11/14/2021)   Sciatica of left side associated with disorder of lumbosacral spine   Other Visit Diagnoses     Lumbar spondylosis with myelopathy       Relevant Medications   triamcinolone acetonide (KENALOG-40) injection 40 mg (Completed)   oxyCODONE ER (XTAMPZA ER) 36 MG C12A (Start on 10/15/2021)   oxyCODONE ER (XTAMPZA ER) 36 MG C12A (Start on 11/14/2021)         ----------------------------------------------------------------------------------------------------------------------  1. Sciatica of left side associated with disorder of lumbosacral spine We will proceed with a repeat epidural today.  We gone over the risks and benefits of the procedure for him.  He seems to get good relief from these generally lasting about 2 to 3 months before he has recurrence of the gradual nature.  I want her to continue efforts at weight loss stretching strengthening. - Lumbar Epidural Injection  2. DDD (degenerative disc disease), lumbar As above - Lumbar Epidural Injection  3. Lumbar spondylosis with myelopathy As above - Lumbar Epidural Injection  4. Chronic pain syndrome I have  reviewed the [facility] practitioner database information and I think is appropriate for refills dated from March 22 and April 21.  No other changes in his pharmacologic regimen will be initiated today.  Want him to continue follow-up with his primary care physicians for his baseline medical care with return to clinic scheduled in 2 months.  5. Long term current use of opiate analgesic   6. Musculoskeletal pain   7. Chronic left-sided low back pain with left-sided sciatica As above    ----------------------------------------------------------------------------------------------------------------------  I am having Khayree N. Normoyle start on [place] [facility]. I am also having him maintain his aspirin, metFORMIN, atenolol, diltiazem, Multiple Vitamins-Minerals (VITAMINS TO GO MEN PO), rosuvastatin, TraZODone HCl, glipiZIDE, nitroGLYCERIN, Fish Oil, VITAMIN E PO, Melatonin, glucosamine-chondroitin, tadalafil, apixaban, lisinopril, tiZANidine, and Xtampza ER. We administered triamcinolone acetonide, sodium chloride flush, ropivacaine (PF) 2 mg/mL (0.2%), lidocaine (PF), and iohexol.   Meds ordered this encounter  Medications   triamcinolone acetonide (KENALOG-40) injection 40 mg   sodium chloride flush (NS) 0.9 % injection 10 mL   ropivacaine (PF) 2 mg/mL (0.2%) (NAROPIN) injection 10 mL   lidocaine (PF) (XYLOCAINE) 1 % injection 5 mL   iohexol (OMNIPAQUE) 180 MG/ML injection 10 mL   oxyCODONE ER (XTAMPZA ER) 36 MG C12A    Sig: Take 2 capsules (72 mg total) by mouth 2 (two) times daily.    Dispense:  120 capsule    Refill:  0   oxyCODONE ER (XTAMPZA ER) 36 MG C12A    Sig: Take 2 capsules (72 mg total) by mouth 2 (two) times daily.    Dispense:  120 capsule    Refill:  0   Patient's Medications  New Prescriptions   OXYCODONE ER (XTAMPZA ER) 36 MG C12A    Take 2 capsules (72 mg total) by mouth 2 (two) times daily.  Previous Medications   APIXABAN (ELIQUIS) 5 MG TABS TABLET    TAKE ONE  TABLET BY MOUTH TWICE A DAY (CAUTION: BLOOD THINNER)   ASPIRIN 81 MG TABLET    Take 162 mg by mouth daily.  ATENOLOL (TENORMIN) 100 MG TABLET    Take 50 mg by mouth 2 (two) times daily.   DILTIAZEM (CARDIZEM CD) 240 MG 24 HR CAPSULE    Take 240 mg by mouth daily.   GLIPIZIDE (GLUCOTROL) 10 MG TABLET    Take 10 mg 2 (two) times daily by mouth.   GLUCOSAMINE-CHONDROITIN 500-400 MG TABLET    Take 1 tablet by mouth daily.   LISINOPRIL (ZESTRIL) 10 MG TABLET    Take 10 mg by mouth 2 (two) times daily.   MELATONIN 10 MG TABS    Take by mouth at bedtime as needed.   METFORMIN (GLUCOPHAGE) 500 MG TABLET    Take 1,000 mg by mouth 2 (two) times daily with a meal. Reported on 02/10/2016   MULTIPLE VITAMINS-MINERALS (VITAMINS TO GO MEN PO)    Take 1 tablet by mouth daily.    NITROGLYCERIN (NITROSTAT) 0.4 MG SL TABLET       OMEGA-3 FATTY ACIDS (FISH OIL) 1000 MG CAPS    Take by mouth daily.   ROSUVASTATIN (CRESTOR) 10 MG TABLET    Take 10 mg by mouth daily.   TADALAFIL (CIALIS) 20 MG TABLET    Take 20 mg by mouth daily as needed.   TIZANIDINE (ZANAFLEX) 4 MG TABLET    Take 1 tablet (4 mg total) by mouth 2 (two) times daily.   TRAZODONE HCL 150 MG TB24    Take by mouth at bedtime.   VITAMIN E PO    Take 90 Units by mouth daily.  Modified Medications   Modified Medication Previous Medication   OXYCODONE ER (XTAMPZA ER) 36 MG C12A oxyCODONE ER (XTAMPZA ER) 36 MG C12A      Take 2 capsules (72 mg total) by mouth 2 (two) times daily.    Take 2 capsules (72 mg total) by mouth 2 (two) times daily.  Discontinued Medications   No medications on file   ----------------------------------------------------------------------------------------------------------------------  Follow-up: Return in about 2 months (around 12/02/2021) for evaluation, med refill.   Procedure: L5-S1 LESI with fluoroscopic guidance and no moderate sedation  NOTE: The risks, benefits, and expectations of the procedure have been discussed and  explained to the patient who was understanding and in agreement with suggested treatment plan. No guarantees were made.  DESCRIPTION OF PROCEDURE: Lumbar epidural steroid injection with no IV Versed, EKG, blood pressure, pulse, and pulse oximetry monitoring. The procedure was performed with the patient in the prone position under fluoroscopic guidance.  Sterile prep x3 was initiated and I then injected subcutaneous lidocaine to the overlying L5-S1 site after its fluoroscopic identifictation.  Using strict aseptic technique, I then advanced an 18-gauge Tuohy epidural needle in the midline using interlaminar approach via loss-of-resistance to saline technique. There was negative aspiration for heme or  CSF.  I then confirmed position with both AP and Lateral fluoroscan.  2 cc of contrast dye were injected and a  total of 5 mL of Preservative-Free normal saline mixed with 40 mg of Kenalog and 1cc Ropicaine 0.2 percent were injected incrementally via the  epidurally placed needle. The needle was removed. The patient tolerated the injection well and was convalesced and discharged to home in stable condition. Should the patient have any post procedure difficulty they have been instructed on how to contact us for assistance.    Molli Barrows, MD

## 2021-10-02 NOTE — Progress Notes (Signed)
NNursing Pain Medication Assessment:  ?Safety precautions to be maintained throughout the outpatient stay will include: orient to surroundings, keep bed in low position, maintain call bell within reach at all times, provide assistance with transfer out of bed and ambulation.  ?Medication Inspection Compliance: Pill count conducted under aseptic conditions, in front of the patient. Neither the pills nor the bottle was removed from the patient's sight at any time. Once count was completed pills were immediately returned to the patient in their original bottle. ? ?Medication:  Xtampza ?Pill/Patch Count:  50 of 120 pills remain ?Pill/Patch Appearance: Markings consistent with prescribed medication ?Bottle Appearance: Standard pharmacy container. Clearly labeled. ?Filled Date: 02 / 20 / 2023 ?Last Medication intake:  Today ?

## 2021-10-03 ENCOUNTER — Telehealth: Payer: Self-pay

## 2021-10-03 NOTE — Telephone Encounter (Signed)
Post procedure follow up phone cal..  LM ?

## 2021-10-08 ENCOUNTER — Ambulatory Visit: Payer: Medicare Other | Admitting: Dermatology

## 2021-10-08 ENCOUNTER — Other Ambulatory Visit: Payer: Self-pay | Admitting: Dermatology

## 2021-10-08 ENCOUNTER — Other Ambulatory Visit: Payer: Self-pay

## 2021-10-08 DIAGNOSIS — L821 Other seborrheic keratosis: Secondary | ICD-10-CM | POA: Diagnosis not present

## 2021-10-08 DIAGNOSIS — L570 Actinic keratosis: Secondary | ICD-10-CM | POA: Diagnosis not present

## 2021-10-08 DIAGNOSIS — D239 Other benign neoplasm of skin, unspecified: Secondary | ICD-10-CM

## 2021-10-08 DIAGNOSIS — D1800 Hemangioma unspecified site: Secondary | ICD-10-CM

## 2021-10-08 DIAGNOSIS — D492 Neoplasm of unspecified behavior of bone, soft tissue, and skin: Secondary | ICD-10-CM

## 2021-10-08 DIAGNOSIS — D225 Melanocytic nevi of trunk: Secondary | ICD-10-CM | POA: Diagnosis not present

## 2021-10-08 DIAGNOSIS — L814 Other melanin hyperpigmentation: Secondary | ICD-10-CM | POA: Diagnosis not present

## 2021-10-08 DIAGNOSIS — L578 Other skin changes due to chronic exposure to nonionizing radiation: Secondary | ICD-10-CM

## 2021-10-08 DIAGNOSIS — L820 Inflamed seborrheic keratosis: Secondary | ICD-10-CM | POA: Diagnosis not present

## 2021-10-08 DIAGNOSIS — D229 Melanocytic nevi, unspecified: Secondary | ICD-10-CM | POA: Diagnosis not present

## 2021-10-08 DIAGNOSIS — Z1283 Encounter for screening for malignant neoplasm of skin: Secondary | ICD-10-CM

## 2021-10-08 HISTORY — DX: Other benign neoplasm of skin, unspecified: D23.9

## 2021-10-08 NOTE — Patient Instructions (Addendum)
Cryotherapy Aftercare  Wash gently with soap and water everyday.   Apply Vaseline and Band-Aid daily until healed.    Wound Care Instructions  Cleanse wound gently with soap and water once a day then pat dry with clean gauze. Apply a thing coat of Petrolatum (petroleum jelly, "Vaseline") over the wound (unless you have an allergy to this). We recommend that you use a new, sterile tube of Vaseline. Do not pick or remove scabs. Do not remove the yellow or white "healing tissue" from the base of the wound.  Cover the wound with fresh, clean, nonstick gauze and secure with paper tape. You may use Band-Aids in place of gauze and tape if the would is small enough, but would recommend trimming much of the tape off as there is often too much. Sometimes Band-Aids can irritate the skin.  You should call the office for your biopsy report after 1 week if you have not already been contacted.  If you experience any problems, such as abnormal amounts of bleeding, swelling, significant bruising, significant pain, or evidence of infection, please call the office immediately.  FOR ADULT SURGERY PATIENTS: If you need something for pain relief you may take 1 extra strength Tylenol (acetaminophen) AND 2 Ibuprofen (200mg each) together every 4 hours as needed for pain. (do not take these if you are allergic to them or if you have a reason you should not take them.) Typically, you may only need pain medication for 1 to 3 days.        If You Need Anything After Your Visit  If you have any questions or concerns for your doctor, please call our main line at [phone] and press option 4 to reach your doctor's medical assistant. If no one answers, please leave a voicemail as directed and we will return your call as soon as possible. Messages left after 4 pm will be answered the following business day.   You may also send us a message via MyChart. We typically respond to MyChart messages within 1-2 business  days.  For prescription refills, please ask your pharmacy to contact our office. Our fax number is [phone].  If you have an urgent issue when the clinic is closed that cannot wait until the next business day, you can page your doctor at the number below.    Please note that while we do our best to be available for urgent issues outside of office hours, we are not available 24/7.   If you have an urgent issue and are unable to reach us, you may choose to seek medical care at your doctor's office, retail clinic, urgent care center, or emergency room.  If you have a medical emergency, please immediately call 911 or go to the emergency department.  Pager Numbers  - Dr. Kowalski: [phone]  - Dr. Moye: [phone]  - Dr. Stewart: [phone]  In the event of inclement weather, please call our main line at [phone] for an update on the status of any delays or closures.  Dermatology Medication Tips: Please keep the boxes that topical medications come in in order to help keep track of the instructions about where and how to use these. Pharmacies typically print the medication instructions only on the boxes and not directly on the medication tubes.   If your medication is too expensive, please contact our office at [phone] option 4 or send us a message through MyChart.   We are unable to tell what your co-pay for medications will be   in advance as this is different depending on your insurance coverage. However, we may be able to find a substitute medication at lower cost or fill out paperwork to get insurance to cover a needed medication.   If a prior authorization is required to get your medication covered by your insurance company, please allow us 1-2 business days to complete this process.  Drug prices often vary depending on where the prescription is filled and some pharmacies may offer cheaper prices.  The website www.goodrx.com contains coupons for medications through  different pharmacies. The prices here do not account for what the cost may be with help from insurance (it may be cheaper with your insurance), but the website can give you the price if you did not use any insurance.  - You can print the associated coupon and take it with your prescription to the pharmacy.  - You may also stop by our office during regular business hours and pick up a GoodRx coupon card.  - If you need your prescription sent electronically to a different pharmacy, notify our office through [facility] MyChart or by phone at [phone] option 4.     Si Usted Necesita Algo Despus de Su Visita  Tambin puede enviarnos un mensaje a travs de MyChart. Por lo general respondemos a los mensajes de MyChart en el transcurso de 1 a 2 das hbiles.  Para renovar recetas, por favor pida a su farmacia que se ponga en contacto con nuestra oficina. Nuestro nmero de fax es el [phone].  Si tiene un asunto urgente cuando la clnica est cerrada y que no puede esperar hasta el siguiente da hbil, puede llamar/localizar a su doctor(a) al nmero que aparece a continuacin.   Por favor, tenga en cuenta que aunque hacemos todo lo posible para estar disponibles para asuntos urgentes fuera del horario de oficina, no estamos disponibles las 24 horas del da, los 7 das de la semana.   Si tiene un problema urgente y no puede comunicarse con nosotros, puede optar por buscar atencin mdica  en el consultorio de su doctor(a), en una clnica privada, en un centro de atencin urgente o en una sala de emergencias.  Si tiene una emergencia mdica, por favor llame inmediatamente al 911 o vaya a la sala de emergencias.  Nmeros de bper  - Dr. Kowalski: [phone]  - Dra. Moye: [phone]  - Dra. Stewart: [phone]  En caso de inclemencias del tiempo, por favor llame a nuestra lnea principal al [phone] para una actualizacin sobre el estado de cualquier retraso o cierre.  Consejos  para la medicacin en dermatologa: Por favor, guarde las cajas en las que vienen los medicamentos de uso tpico para ayudarle a seguir las instrucciones sobre dnde y cmo usarlos. Las farmacias generalmente imprimen las instrucciones del medicamento slo en las cajas y no directamente en los tubos del medicamento.   Si su medicamento es muy caro, por favor, pngase en contacto con nuestra oficina llamando al [phone] y presione la opcin 4 o envenos un mensaje a travs de MyChart.   No podemos decirle cul ser su copago por los medicamentos por adelantado ya que esto es diferente dependiendo de la cobertura de su seguro. Sin embargo, es posible que podamos encontrar un medicamento sustituto a menor costo o llenar un formulario para que el seguro cubra el medicamento que se considera necesario.   Si se requiere una autorizacin previa para que su compaa de seguros cubra su medicamento, por favor permtanos de 1 a   2 das hbiles para completar este proceso.  Los precios de los medicamentos varan con frecuencia dependiendo del lugar de dnde se surte la receta y alguna farmacias pueden ofrecer precios ms baratos.  El sitio web www.goodrx.com tiene cupones para medicamentos de diferentes farmacias. Los precios aqu no tienen en cuenta lo que podra costar con la ayuda del seguro (puede ser ms barato con su seguro), pero el sitio web puede darle el precio si no utiliz ningn seguro.  - Puede imprimir el cupn correspondiente y llevarlo con su receta a la farmacia.  - Tambin puede pasar por nuestra oficina durante el horario de atencin regular y recoger una tarjeta de cupones de GoodRx.  - Si necesita que su receta se enve electrnicamente a una farmacia diferente, informe a nuestra oficina a travs de MyChart de [facility] o por telfono llamando al [phone] y presione la opcin 4.  

## 2021-10-08 NOTE — Progress Notes (Deleted)
? ?Follow-Up Visit ?  ?Subjective  ?Peter Lamb is a 66 y.o. male who presents for the following: Annual Exam (Mole check ). ?The patient presents for Total-Body Skin Exam (TBSE) for skin cancer screening and mole check.  The patient has spots, moles and lesions to be evaluated, some may be new or changing and the patient has concerns that these could be cancer.  ? ? ?The following portions of the chart were reviewed this encounter and updated as appropriate:  ?  ?  ? ?Review of Systems:  No other skin or systemic complaints except as noted in HPI or Assessment and Plan. ? ?Objective  ?Well appearing patient in no apparent distress; mood and affect are within normal limits. ? ?A full examination was performed including scalp, head, eyes, ears, nose, lips, neck, chest, axillae, abdomen, back, buttocks, bilateral upper extremities, bilateral lower extremities, hands, feet, fingers, toes, fingernails, and toenails. All findings within normal limits unless otherwise noted below. ? ?right cheek x 1 ?Erythematous thin papules/macules with gritty scale.  ? ?right cheek x 1 ?Stuck-on, waxy, tan-brown papule  ? ?superior medial buttock ?0.7 cm medium brown irregular macule  ? ? ? ? ? ? ? ? ?Assessment & Plan  ?AK (actinic keratosis) ?right cheek x 1 ? ?Actinic keratoses are precancerous spots that appear secondary to cumulative UV radiation exposure/sun exposure over time. They are chronic with expected duration over 1 year. A portion of actinic keratoses will progress to squamous cell carcinoma of the skin. It is not possible to reliably predict which spots will progress to skin cancer and so treatment is recommended to prevent development of skin cancer. ? ?Recommend daily broad spectrum sunscreen SPF 30+ to sun-exposed areas, reapply every 2 hours as needed.  ?Recommend staying in the shade or wearing long sleeves, sun glasses (UVA+UVB protection) and wide brim hats (4-inch brim around the entire circumference of the  hat). ?Call for new or changing lesions.  ? ?Destruction of lesion - right cheek x 1 ?Complexity: simple   ?Destruction method: cryotherapy   ?Informed consent: discussed and consent obtained   ?Timeout:  patient name, date of birth, surgical site, and procedure verified ?Lesion destroyed using liquid nitrogen: Yes   ?Region frozen until ice ball extended beyond lesion: Yes   ?Outcome: patient tolerated procedure well with no complications   ?Post-procedure details: wound care instructions given   ? ?Inflamed seborrheic keratosis ?right cheek x 1 ? ?Reassured benign age-related growth.  Recommend observation.  Discussed cryotherapy if spot(s) become irritated or inflamed.  ? ?Destruction of lesion - right cheek x 1 ?Complexity: simple   ?Destruction method: cryotherapy   ?Informed consent: discussed and consent obtained   ?Timeout:  patient name, date of birth, surgical site, and procedure verified ?Lesion destroyed using liquid nitrogen: Yes   ?Region frozen until ice ball extended beyond lesion: Yes   ?Outcome: patient tolerated procedure well with no complications   ?Post-procedure details: wound care instructions given   ? ?Neoplasm of skin ?superior medial buttock ? ?Epidermal / dermal shaving ? ?Lesion diameter (cm):  0.7 ?Informed consent: discussed and consent obtained   ?Timeout: patient name, date of birth, surgical site, and procedure verified   ?Patient was prepped and draped in usual sterile fashion: area prepped with alcohol. ?Anesthesia: the lesion was anesthetized in a standard fashion   ?Anesthetic:  1% lidocaine w/ epinephrine 1-100,000 local infiltration ?Instrument used: flexible razor blade   ?Hemostasis achieved with: pressure, aluminum chloride and electrodesiccation   ?  Outcome: patient tolerated procedure well   ?Post-procedure details: wound care instructions given   ?Post-procedure details comment:  Ointment and a small bandage applied ? ?Specimen 1 - Surgical pathology ?Differential  Diagnosis: R/O Dysplastic nevus  ? ?Check Margins: No ? ? ?Lentigines ?- Scattered tan macules ?- Due to sun exposure ?- Benign-appearing, observe ?- Recommend daily broad spectrum sunscreen SPF 30+ to sun-exposed areas, reapply every 2 hours as needed. ?- Call for any changes ? ?Seborrheic Keratoses ?- Stuck-on, waxy, tan-brown papules and/or plaques  ?- Benign-appearing ?- Discussed benign etiology and prognosis. ?- Observe ?- Call for any changes ? ?Melanocytic Nevi ?- Tan-brown and/or pink-flesh-colored symmetric macules and papules ?- Benign appearing on exam today ?- Observation ?- Call clinic for new or changing moles ?- Recommend daily use of broad spectrum spf 30+ sunscreen to sun-exposed areas.  ? ?Hemangiomas ?- Red papules ?- Discussed benign nature ?- Observe ?- Call for any changes ? ?Actinic Damage ?- Chronic condition, secondary to cumulative UV/sun exposure ?- diffuse scaly erythematous macules with underlying dyspigmentation ?- Recommend daily broad spectrum sunscreen SPF 30+ to sun-exposed areas, reapply every 2 hours as needed.  ?- Staying in the shade or wearing long sleeves, sun glasses (UVA+UVB protection) and wide brim hats (4-inch brim around the entire circumference of the hat) are also recommended for sun protection.  ?- Call for new or changing lesions. ? ?Skin cancer screening performed today.  ? ?Return in about 1 year (around 10/09/2022) for TBSE . ? ?I, Marye Round, CMA, am acting as scribe for Sarina Ser, MD .  ?

## 2021-10-08 NOTE — Progress Notes (Signed)
? ?Follow-Up Visit ?  ?Subjective  ?Peter Lamb is a 66 y.o. male who presents for the following: Annual Exam (Mole check ). Yearly mole check hx of Aks. ?The patient presents for Total-Body Skin Exam (TBSE) for skin cancer screening and mole check.  The patient has spots, moles and lesions to be evaluated, some may be new or changing and the patient has concerns that these could be cancer.  ? ?The following portions of the chart were reviewed this encounter and updated as appropriate:  ? Tobacco  Allergies  Meds  Problems  Med Hx  Surg Hx  Fam Hx   ?  ?Review of Systems:  No other skin or systemic complaints except as noted in HPI or Assessment and Plan. ? ?Objective  ?Well appearing patient in no apparent distress; mood and affect are within normal limits. ? ?A full examination was performed including scalp, head, eyes, ears, nose, lips, neck, chest, axillae, abdomen, back, buttocks, bilateral upper extremities, bilateral lower extremities, hands, feet, fingers, toes, fingernails, and toenails. All findings within normal limits unless otherwise noted below. ? ?right cheek x 1 ?Erythematous thin papules/macules with gritty scale.  ? ?right cheek x 1 ?Stuck-on, waxy, tan-brown papule  ? ?superior medial buttock ?0.7 cm medium brown irregular macule  ? ? ? ? ? ? ? ?Assessment & Plan  ?AK (actinic keratosis) ?right cheek x 1 ? ?Actinic keratoses are precancerous spots that appear secondary to cumulative UV radiation exposure/sun exposure over time. They are chronic with expected duration over 1 year. A portion of actinic keratoses will progress to squamous cell carcinoma of the skin. It is not possible to reliably predict which spots will progress to skin cancer and so treatment is recommended to prevent development of skin cancer. ? ?Recommend daily broad spectrum sunscreen SPF 30+ to sun-exposed areas, reapply every 2 hours as needed.  ?Recommend staying in the shade or wearing long sleeves, sun glasses  (UVA+UVB protection) and wide brim hats (4-inch brim around the entire circumference of the hat). ?Call for new or changing lesions.  ? ?Destruction of lesion - right cheek x 1 ?Complexity: simple   ?Destruction method: cryotherapy   ?Informed consent: discussed and consent obtained   ?Timeout:  patient name, date of birth, surgical site, and procedure verified ?Lesion destroyed using liquid nitrogen: Yes   ?Region frozen until ice ball extended beyond lesion: Yes   ?Outcome: patient tolerated procedure well with no complications   ?Post-procedure details: wound care instructions given   ? ?Inflamed seborrheic keratosis ?right cheek x 1 ? ?Reassured benign age-related growth.  Recommend observation.  Discussed cryotherapy if spot(s) become irritated or inflamed.  ? ?Destruction of lesion - right cheek x 1 ?Complexity: simple   ?Destruction method: cryotherapy   ?Informed consent: discussed and consent obtained   ?Timeout:  patient name, date of birth, surgical site, and procedure verified ?Lesion destroyed using liquid nitrogen: Yes   ?Region frozen until ice ball extended beyond lesion: Yes   ?Outcome: patient tolerated procedure well with no complications   ?Post-procedure details: wound care instructions given   ? ?Neoplasm of skin ?superior medial buttock ? ?Epidermal / dermal shaving ? ?Lesion diameter (cm):  0.7 ?Informed consent: discussed and consent obtained   ?Timeout: patient name, date of birth, surgical site, and procedure verified   ?Patient was prepped and draped in usual sterile fashion: area prepped with alcohol. ?Anesthesia: the lesion was anesthetized in a standard fashion   ?Anesthetic:  1% lidocaine w/  epinephrine 1-100,000 local infiltration ?Instrument used: flexible razor blade   ?Hemostasis achieved with: pressure, aluminum chloride and electrodesiccation   ?Outcome: patient tolerated procedure well   ?Post-procedure details: wound care instructions given   ?Post-procedure details comment:   Ointment and a small bandage applied ? ?Specimen 1 - Surgical pathology ?Differential Diagnosis: R/O Dysplastic nevus  ? ?Check Margins: No ? ?Skin cancer screening ? ?Lentigines ?- Scattered tan macules ?- Due to sun exposure ?- Benign-appearing, observe ?- Recommend daily broad spectrum sunscreen SPF 30+ to sun-exposed areas, reapply every 2 hours as needed. ?- Call for any changes ? ?Seborrheic Keratoses ?- Stuck-on, waxy, tan-brown papules and/or plaques  ?- Benign-appearing ?- Discussed benign etiology and prognosis. ?- Observe ?- Call for any changes ? ?Melanocytic Nevi ?- Tan-brown and/or pink-flesh-colored symmetric macules and papules ?- Benign appearing on exam today ?- Observation ?- Call clinic for new or changing moles ?- Recommend daily use of broad spectrum spf 30+ sunscreen to sun-exposed areas.  ? ?Hemangiomas ?- Red papules ?- Discussed benign nature ?- Observe ?- Call for any changes ? ?Actinic Damage ?- Chronic condition, secondary to cumulative UV/sun exposure ?- diffuse scaly erythematous macules with underlying dyspigmentation ?- Recommend daily broad spectrum sunscreen SPF 30+ to sun-exposed areas, reapply every 2 hours as needed.  ?- Staying in the shade or wearing long sleeves, sun glasses (UVA+UVB protection) and wide brim hats (4-inch brim around the entire circumference of the hat) are also recommended for sun protection.  ?- Call for new or changing lesions. ? ?Skin cancer screening performed today.  ? ?Return in about 1 year (around 10/09/2022) for TBSE . ? ?I, Marye Round, CMA, am acting as scribe for Sarina Ser, MD .  ?Documentation: I have reviewed the above documentation for accuracy and completeness, and I agree with the above. ? ?Sarina Ser, MD ? ?

## 2021-10-13 ENCOUNTER — Telehealth: Payer: Self-pay

## 2021-10-13 ENCOUNTER — Encounter: Payer: Self-pay | Admitting: Dermatology

## 2021-10-13 NOTE — Telephone Encounter (Signed)
-----   Message from Ralene Bathe, MD sent at 10/13/2021  3:26 PM EDT ----- ?Diagnosis ?Skin , superior medial buttock ?DYSPLASTIC COMPOUND NEVUS WITH MODERATE ATYPIA, LIMITED MARGINS FREE ? ?Moderate dysplastic ?Recheck next visit ?

## 2021-10-13 NOTE — Telephone Encounter (Signed)
Patient informed of pathology results 

## 2021-11-02 ENCOUNTER — Other Ambulatory Visit: Payer: Self-pay | Admitting: Anesthesiology

## 2021-12-03 ENCOUNTER — Ambulatory Visit: Payer: Medicare Other | Attending: Anesthesiology | Admitting: Anesthesiology

## 2021-12-03 ENCOUNTER — Encounter: Payer: Self-pay | Admitting: Anesthesiology

## 2021-12-03 DIAGNOSIS — M5387 Other specified dorsopathies, lumbosacral region: Secondary | ICD-10-CM | POA: Diagnosis not present

## 2021-12-03 DIAGNOSIS — M5136 Other intervertebral disc degeneration, lumbar region: Secondary | ICD-10-CM | POA: Diagnosis not present

## 2021-12-03 DIAGNOSIS — M4716 Other spondylosis with myelopathy, lumbar region: Secondary | ICD-10-CM

## 2021-12-03 DIAGNOSIS — G894 Chronic pain syndrome: Secondary | ICD-10-CM | POA: Diagnosis not present

## 2021-12-03 DIAGNOSIS — Z79891 Long term (current) use of opiate analgesic: Secondary | ICD-10-CM

## 2021-12-03 DIAGNOSIS — M7918 Myalgia, other site: Secondary | ICD-10-CM

## 2021-12-03 MED ORDER — XTAMPZA ER 36 MG PO C12A: 2.0000 | EXTENDED_RELEASE_CAPSULE | Freq: Two times a day (BID) | ORAL | 0 refills | Status: DC

## 2021-12-03 MED ORDER — XTAMPZA ER 36 MG PO C12A: 2.0000 | EXTENDED_RELEASE_CAPSULE | Freq: Two times a day (BID) | ORAL | 0 refills | Status: AC

## 2021-12-03 NOTE — Progress Notes (Signed)
Virtual Visit via Telephone Note ? ?I connected with Peter Lamb on 12/03/21 at  3:00 PM EDT by telephone and verified that I am speaking with the correct person using two identifiers. ? ?Location: ?Patient: Home ?Provider: [facility] control center ?  ?I discussed the limitations, risks, security and privacy concerns of performing an evaluation and management service by telephone and the availability of in person appointments. I also discussed with the patient that there may be a patient responsible charge related to this service. The patient expressed understanding and agreed to proceed. ? ? ?History of Present Illness: ?I spoke with Peter Lamb today via telephone.  We were unable link for the video portion of the conference.  Peter Lamb last saw [place] about 2 months ago and had an epidural steroid injection at that time.  He maintains that he is doing well and that his left leg sciatica has improved and has not recurred.  His low back pain persist and he continues to take his Xtampza 2 tablets twice a day and this continues to work well for his chronic pain.  At this point he denies side effects with the medication and continues to get good relief.  No change in lower extremity strength function or bowel bladder function is noted.  He is trying to stay active doing his physical therapy regimen at home. ? ?Review of systems: ?General: No fevers or chills ?Pulmonary: No shortness of breath or dyspnea ?Cardiac: No angina or palpitations or lightheadedness ?GI: No abdominal pain or constipation ?Psych: No depression  ?  ?Observations/Objective: ? ?Current Outpatient Medications:  ?  [START ON 01/13/2022] oxyCODONE ER (XTAMPZA ER) 36 MG C12A, Take 2 capsules (72 mg total) by mouth 2 (two) times daily., Disp: 120 capsule, Rfl: 0 ?  apixaban (ELIQUIS) 5 MG TABS tablet, TAKE ONE TABLET BY MOUTH TWICE A DAY (CAUTION: BLOOD THINNER) (Patient not taking: Reported on 06/16/2021), Disp: , Rfl:  ?  aspirin 81 MG tablet, Take 162 mg by mouth  daily., Disp: , Rfl:  ?  atenolol (TENORMIN) 100 MG tablet, Take 50 mg by mouth 2 (two) times daily., Disp: , Rfl:  ?  diltiazem (CARDIZEM CD) 240 MG 24 hr capsule, Take 240 mg by mouth daily. (Patient not taking: Reported on 05/26/2021), Disp: , Rfl:  ?  glipiZIDE (GLUCOTROL) 10 MG tablet, Take 10 mg 2 (two) times daily by mouth. (Patient not taking: Reported on 05/26/2021), Disp: , Rfl: 3 ?  glucosamine-chondroitin 500-400 MG tablet, Take 1 tablet by mouth daily., Disp: , Rfl:  ?  lisinopril (ZESTRIL) 10 MG tablet, Take 10 mg by mouth 2 (two) times daily., Disp: , Rfl:  ?  Melatonin 10 MG TABS, Take by mouth at bedtime as needed., Disp: , Rfl:  ?  metFORMIN (GLUCOPHAGE) 500 MG tablet, Take 1,000 mg by mouth 2 (two) times daily with a meal. Reported on 02/10/2016, Disp: , Rfl:  ?  Multiple Vitamins-Minerals (VITAMINS TO GO MEN PO), Take 1 tablet by mouth daily. , Disp: , Rfl:  ?  nitroGLYCERIN (NITROSTAT) 0.4 MG SL tablet, , Disp: , Rfl:  ?  Omega-3 Fatty Acids (FISH OIL) 1000 MG CAPS, Take by mouth daily., Disp: , Rfl:  ?  [START ON 12/14/2021] oxyCODONE ER (XTAMPZA ER) 36 MG C12A, Take 2 capsules (72 mg total) by mouth 2 (two) times daily., Disp: 120 capsule, Rfl: 0 ?  rosuvastatin (CRESTOR) 10 MG tablet, Take 10 mg by mouth daily., Disp: , Rfl:  ?  tadalafil (CIALIS) 20  MG tablet, Take 20 mg by mouth daily as needed., Disp: , Rfl:  ?  tiZANidine (ZANAFLEX) 4 MG tablet, Take 1 tablet (4 mg total) by mouth 2 (two) times daily., Disp: 60 tablet, Rfl: 3 ?  TraZODone HCl 150 MG TB24, Take by mouth at bedtime., Disp: , Rfl:  ?  VITAMIN E PO, Take 90 Units by mouth daily. (Patient not taking: Reported on 05/26/2021), Disp: , Rfl:   ? ?Past Medical History:  ?Diagnosis Date  ? Arthritis   ? back, neck  ? Carpal boss of right wrist   ? CHF (congestive heart failure) ([place])   ? Depression   ? Diabetes mellitus without complication ([place])   ? Dysplastic nevus 10/08/2021  ? superior med buttocks - Moderate  ? GERD  (gastroesophageal reflux disease)   ? H/O tooth extraction   ? Hyperlipidemia   ? Hypertension   ?  ? ?Assessment and Plan: ? ?1. DDD (degenerative disc disease), lumbar   ?2. Lumbar spondylosis with myelopathy   ?3. 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 Chronic pain syndrome   ?5. Long term current use of opiate analgesic   ?6. Musculoskeletal pain   ?Based on her discussion today I think it is appropriate to continue his current medication regimen with no changes necessary.  Refills will be written for his Xtampza from May 21 and June 20.  Want him to continue with his exercises including the stretching strengthening as tolerated.  We will schedule him for 54-monthreturn to clinic and defer on any repeat injections at this time.  Continue follow-up with his primary care physicians for baseline medical care. ?Follow Up Instructions: ? ?  ?I discussed the assessment and treatment plan with the patient. The patient was provided an opportunity to ask questions and all were answered. The patient agreed with the plan and demonstrated an understanding of the instructions. ?  ?The patient was advised to call back or seek an in-person evaluation if the symptoms worsen or if the condition fails to improve as anticipated. ? ?I provided 30 minutes of non-face-to-face time during this encounter. ? ? ?JMolli Barrows MD  ?

## 2022-02-05 ENCOUNTER — Encounter: Payer: Self-pay | Admitting: Anesthesiology

## 2022-02-05 ENCOUNTER — Ambulatory Visit: Payer: Medicare Other | Attending: Anesthesiology | Admitting: Anesthesiology

## 2022-02-05 DIAGNOSIS — M5387 Other specified dorsopathies, lumbosacral region: Secondary | ICD-10-CM | POA: Diagnosis not present

## 2022-02-05 DIAGNOSIS — G894 Chronic pain syndrome: Secondary | ICD-10-CM | POA: Diagnosis not present

## 2022-02-05 DIAGNOSIS — G8929 Other chronic pain: Secondary | ICD-10-CM

## 2022-02-05 DIAGNOSIS — M4716 Other spondylosis with myelopathy, lumbar region: Secondary | ICD-10-CM

## 2022-02-05 DIAGNOSIS — Z79891 Long term (current) use of opiate analgesic: Secondary | ICD-10-CM

## 2022-02-05 DIAGNOSIS — M5136 Other intervertebral disc degeneration, lumbar region: Secondary | ICD-10-CM

## 2022-02-05 DIAGNOSIS — M5442 Lumbago with sciatica, left side: Secondary | ICD-10-CM

## 2022-02-05 DIAGNOSIS — M7918 Myalgia, other site: Secondary | ICD-10-CM

## 2022-02-05 MED ORDER — XTAMPZA ER 36 MG PO C12A: 2.0000 | EXTENDED_RELEASE_CAPSULE | Freq: Two times a day (BID) | ORAL | 0 refills | Status: DC

## 2022-02-05 MED ORDER — XTAMPZA ER 36 MG PO C12A: 2.0000 | EXTENDED_RELEASE_CAPSULE | Freq: Two times a day (BID) | ORAL | 0 refills | Status: AC

## 2022-02-05 NOTE — Progress Notes (Signed)
Virtual Visit via Telephone Note  I connected with Peter Lamb on 02/05/22 at  3:15 PM EDT by telephone and verified that I am speaking with the correct person using two identifiers.  Location: Patient: Home Provider: Pain control center   I discussed the limitations, risks, security and privacy concerns of performing an evaluation and management service by telephone and the availability of in person appointments. I also discussed with the patient that there may be a patient responsible charge related to this service. The patient expressed understanding and agreed to proceed.   History of Present Illness: I spoke with Peter Lamb via telephone as we are unable link for the video portion of the conference he states that he is doing well with his current regimen.  The quality of his low back pain is stable with no recent changes.  He did well following his March epidural and continues to have good relief from this.  He takes his medications as prescribed and these continue to work well for him.  No side effects are reported.  The quality characteristic and distribution of his low back pain and leg pain otherwise are unchanged.  He is trying to stay active doing physical therapy exercises and is otherwise doing well  Review of systems: General: No fevers or chills Pulmonary: No shortness of breath or dyspnea Cardiac: No angina or palpitations or lightheadedness GI: No abdominal pain or constipation Psych: No depression    Observations/Objective:  Current Outpatient Medications:    [START ON 03/14/2022] oxyCODONE ER (XTAMPZA ER) 36 MG C12A, Take 2 capsules (72 mg total) by mouth 2 (two) times daily., Disp: 120 capsule, Rfl: 0   apixaban (ELIQUIS) 5 MG TABS tablet, TAKE ONE TABLET BY MOUTH TWICE A DAY (CAUTION: BLOOD THINNER) (Patient not taking: Reported on 06/16/2021), Disp: , Rfl:    aspirin 81 MG tablet, Take 162 mg by mouth daily., Disp: , Rfl:    atenolol (TENORMIN) 100 MG tablet,  Take 50 mg by mouth 2 (two) times daily., Disp: , Rfl:    diltiazem (CARDIZEM CD) 240 MG 24 hr capsule, Take 240 mg by mouth daily. (Patient not taking: Reported on 05/26/2021), Disp: , Rfl:    glipiZIDE (GLUCOTROL) 10 MG tablet, Take 10 mg 2 (two) times daily by mouth. (Patient not taking: Reported on 05/26/2021), Disp: , Rfl: 3   glucosamine-chondroitin 500-400 MG tablet, Take 1 tablet by mouth daily., Disp: , Rfl:    lisinopril (ZESTRIL) 10 MG tablet, Take 10 mg by mouth 2 (two) times daily., Disp: , Rfl:    Melatonin 10 MG TABS, Take by mouth at bedtime as needed., Disp: , Rfl:    metFORMIN (GLUCOPHAGE) 500 MG tablet, Take 1,000 mg by mouth 2 (two) times daily with a meal. Reported on 02/10/2016, Disp: , Rfl:    Multiple Vitamins-Minerals (VITAMINS TO GO MEN PO), Take 1 tablet by mouth daily. , Disp: , Rfl:    nitroGLYCERIN (NITROSTAT) 0.4 MG SL tablet, , Disp: , Rfl:    Omega-3 Fatty Acids (FISH OIL) 1000 MG CAPS, Take by mouth daily., Disp: , Rfl:    [START ON 02/12/2022] oxyCODONE ER (XTAMPZA ER) 36 MG C12A, Take 2 capsules (72 mg total) by mouth 2 (two) times daily., Disp: 120 capsule, Rfl: 0   rosuvastatin (CRESTOR) 10 MG tablet, Take 10 mg by mouth daily., Disp: , Rfl:    tadalafil (CIALIS) 20 MG tablet, Take 20 mg by mouth daily as needed., Disp: , Rfl:  tiZANidine (ZANAFLEX) 4 MG tablet, Take 1 tablet (4 mg total) by mouth 2 (two) times daily., Disp: 60 tablet, Rfl: 3   TraZODone HCl 150 MG TB24, Take by mouth at bedtime., Disp: , Rfl:    VITAMIN E PO, Take 90 Units by mouth daily. (Patient not taking: Reported on 05/26/2021), Disp: , Rfl:    Past Medical History:  Diagnosis Date   Arthritis    back, neck   Carpal boss of right wrist    CHF (congestive heart failure) ([place])    Depression    Diabetes mellitus without complication ([facility])    Dysplastic nevus 10/08/2021   superior med buttocks - Moderate   GERD (gastroesophageal reflux disease)    H/O tooth extraction     Hyperlipidemia    Hypertension      Assessment and Plan: 1. DDD (degenerative disc disease), lumbar   2. Lumbar spondylosis with myelopathy   3. 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 Chronic pain syndrome   5. Long term current use of opiate analgesic   6. Musculoskeletal pain   7. Chronic left-sided low back pain with left-sided sciatica   Based on our conversation is appropriate to refill his medicines for the next 2 months dated for July 20 and August 19.  No other changes in his pharmacologic regimen will be initiated.  I have encouraged him to continue efforts at stretching strengthening and core strengthening.  We will defer any repeat injections at this time and should he have this needed he is instructed to contact us at the pain control center.  Continue follow-up with his primary care physicians for baseline medical care.  Follow Up Instructions:    I discussed the assessment and treatment plan with the patient. The patient was provided an opportunity to ask questions and all were answered. The patient agreed with the plan and demonstrated an understanding of the instructions.   The patient was advised to call back or seek an in-person evaluation if the symptoms worsen or if the condition fails to improve as anticipated.  I provided 30 minutes of non-face-to-face time during this encounter.   Molli Barrows, MD

## 2022-03-23 ENCOUNTER — Other Ambulatory Visit: Payer: Self-pay | Admitting: Anesthesiology

## 2022-04-02 ENCOUNTER — Ambulatory Visit: Payer: Medicare Other | Attending: Anesthesiology | Admitting: Anesthesiology

## 2022-04-02 ENCOUNTER — Encounter: Payer: Self-pay | Admitting: Anesthesiology

## 2022-04-02 DIAGNOSIS — G894 Chronic pain syndrome: Secondary | ICD-10-CM

## 2022-04-02 DIAGNOSIS — M5442 Lumbago with sciatica, left side: Secondary | ICD-10-CM

## 2022-04-02 DIAGNOSIS — M47816 Spondylosis without myelopathy or radiculopathy, lumbar region: Secondary | ICD-10-CM

## 2022-04-02 DIAGNOSIS — M5136 Other intervertebral disc degeneration, lumbar region: Secondary | ICD-10-CM | POA: Diagnosis not present

## 2022-04-02 DIAGNOSIS — M4716 Other spondylosis with myelopathy, lumbar region: Secondary | ICD-10-CM | POA: Diagnosis not present

## 2022-04-02 DIAGNOSIS — Z79891 Long term (current) use of opiate analgesic: Secondary | ICD-10-CM

## 2022-04-02 DIAGNOSIS — M5387 Other specified dorsopathies, lumbosacral region: Secondary | ICD-10-CM

## 2022-04-02 DIAGNOSIS — G8929 Other chronic pain: Secondary | ICD-10-CM

## 2022-04-02 MED ORDER — XTAMPZA ER 36 MG PO C12A: 2.0000 | EXTENDED_RELEASE_CAPSULE | Freq: Two times a day (BID) | ORAL | 0 refills | Status: DC

## 2022-04-02 MED ORDER — XTAMPZA ER 36 MG PO C12A: 2.0000 | EXTENDED_RELEASE_CAPSULE | Freq: Two times a day (BID) | ORAL | 0 refills | Status: AC

## 2022-04-02 NOTE — Progress Notes (Signed)
Virtual Visit via Video Note  I connected with Peter Lamb on 04/02/22 at  3:30 PM EDT by a video enabled telemedicine application and verified that I am speaking with the correct person using two identifiers.  Location: Patient: Home Provider: Pain control center   I discussed the limitations of evaluation and management by telemedicine and the availability of in person appointments. The patient expressed understanding and agreed to proceed.  History of Present Illness: I spoke with Peter Lamb today via telephone as we are unable like for the video portion of the conference.  He reports that he is getting some recurrence of that left lower extremity sciatica.  He last had an epidural back in March and that was very effective for him.  He generally gets nearly 100% relief of his sciatica and lasting for about 3 to 4 months before he gets recurrence of a similar pain.  The pain in the low back is persistent and he does get some relief from the epidurals but generally requires the [facility] for generalized pain control.  He reports no significant change is noted in his back pain and he continues to respond favorably to the Xtampza without side effects.  No change in lower extremity strength function or bowel bladder function is noted.  He is trying to do his activities and physical therapy with some limited success.  He has failed more conservative therapy.  Review of systems: General: No fevers or chills Pulmonary: No shortness of breath or dyspnea Cardiac: No angina or palpitations or lightheadedness GI: No abdominal pain or constipation Psych: No depression    Observations/Objective:  Current Outpatient Medications:    [START ON 05/13/2022] oxyCODONE ER (XTAMPZA ER) 36 MG C12A, Take 2 capsules (72 mg total) by mouth 2 (two) times daily., Disp: 120 capsule, Rfl: 0   apixaban (ELIQUIS) 5 MG TABS tablet, TAKE ONE TABLET BY MOUTH TWICE A DAY (CAUTION: BLOOD THINNER) (Patient not taking: Reported on  06/16/2021), Disp: , Rfl:    aspirin 81 MG tablet, Take 162 mg by mouth daily., Disp: , Rfl:    atenolol (TENORMIN) 100 MG tablet, Take 50 mg by mouth 2 (two) times daily., Disp: , Rfl:    diltiazem (CARDIZEM CD) 240 MG 24 hr capsule, Take 240 mg by mouth daily. (Patient not taking: Reported on 05/26/2021), Disp: , Rfl:    glipiZIDE (GLUCOTROL) 10 MG tablet, Take 10 mg 2 (two) times daily by mouth. (Patient not taking: Reported on 05/26/2021), Disp: , Rfl: 3   glucosamine-chondroitin 500-400 MG tablet, Take 1 tablet by mouth daily., Disp: , Rfl:    lisinopril (ZESTRIL) 10 MG tablet, Take 10 mg by mouth 2 (two) times daily., Disp: , Rfl:    Melatonin 10 MG TABS, Take by mouth at bedtime as needed., Disp: , Rfl:    metFORMIN (GLUCOPHAGE) 500 MG tablet, Take 1,000 mg by mouth 2 (two) times daily with a meal. Reported on 02/10/2016, Disp: , Rfl:    Multiple Vitamins-Minerals (VITAMINS TO GO MEN PO), Take 1 tablet by mouth daily. , Disp: , Rfl:    nitroGLYCERIN (NITROSTAT) 0.4 MG SL tablet, , Disp: , Rfl:    Omega-3 Fatty Acids (FISH OIL) 1000 MG CAPS, Take by mouth daily., Disp: , Rfl:    [START ON 04/13/2022] oxyCODONE ER (XTAMPZA ER) 36 MG C12A, Take 2 capsules (72 mg total) by mouth 2 (two) times daily., Disp: 120 capsule, Rfl: 0   rosuvastatin (CRESTOR) 10 MG tablet, Take 10 mg by mouth  daily., Disp: , Rfl:    tadalafil (CIALIS) 20 MG tablet, Take 20 mg by mouth daily as needed., Disp: , Rfl:    tiZANidine (ZANAFLEX) 4 MG tablet, TAKE 1 TABLET BY MOUTH 2 TIMES DAILY., Disp: 180 tablet, Rfl: 1   TraZODone HCl 150 MG TB24, Take by mouth at bedtime., Disp: , Rfl:    VITAMIN E PO, Take 90 Units by mouth daily. (Patient not taking: Reported on 05/26/2021), Disp: , Rfl:    Past Medical History:  Diagnosis Date   Arthritis    back, neck   Carpal boss of right wrist    CHF (congestive heart failure) ([place])    Depression    Diabetes mellitus without complication (HCC)    Dysplastic nevus 10/08/2021    superior med buttocks - Moderate   GERD (gastroesophageal reflux disease)    H/O tooth extraction    Hyperlipidemia    Hypertension      Assessment and Plan: 1. DDD (degenerative disc disease), lumbar   2. Lumbar spondylosis with myelopathy   3. 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 Chronic pain syndrome   5. Long term current use of opiate analgesic   6. Chronic left-sided low back pain with left-sided sciatica   7. Facet arthritis of lumbar region   Based on discussion today I think it is appropriate to schedule him for an epidural in approximately 3 to 4 weeks.  He continues to have sciatica symptoms that are recurrent.  He is a nonsurgical candidate.  He gets good relief from the epidurals rated about 75 to 100% lasting at least 3 to 4 months.  We will do this at his next appointment.  In the meantime I am going to refill his medicines for Xtampza without change in his pharmacologic regimen.  I have encouraged him to continue with physical therapy exercises and continue follow-up with his primary care physician for baseline medical care.  Follow Up Instructions:    I discussed the assessment and treatment plan with the patient. The patient was provided an opportunity to ask questions and all were answered. The patient agreed with the plan and demonstrated an understanding of the instructions.   The patient was advised to call back or seek an in-person evaluation if the symptoms worsen or if the condition fails to improve as anticipated.  I provided 30 minutes of non-face-to-face time during this encounter.   Molli Barrows, MD

## 2022-04-27 ENCOUNTER — Encounter: Payer: Self-pay | Admitting: Anesthesiology

## 2022-04-27 ENCOUNTER — Ambulatory Visit (HOSPITAL_BASED_OUTPATIENT_CLINIC_OR_DEPARTMENT_OTHER): Payer: Medicare Other | Admitting: Anesthesiology

## 2022-04-27 ENCOUNTER — Other Ambulatory Visit: Payer: Self-pay | Admitting: Anesthesiology

## 2022-04-27 ENCOUNTER — Ambulatory Visit
Admission: RE | Admit: 2022-04-27 | Discharge: 2022-04-27 | Disposition: A | Discharge status: Home / Self Care | Payer: Medicare Other | Source: Ambulatory Visit | Attending: Anesthesiology | Admitting: Anesthesiology

## 2022-04-27 VITALS — BP 151/100 | HR 62 | Temp 97.9°F | Resp 15 | Ht 70.0 in | Wt 210.0 lb

## 2022-04-27 DIAGNOSIS — M5387 Other specified dorsopathies, lumbosacral region: Secondary | ICD-10-CM | POA: Insufficient documentation

## 2022-04-27 DIAGNOSIS — M5442 Lumbago with sciatica, left side: Secondary | ICD-10-CM | POA: Diagnosis not present

## 2022-04-27 DIAGNOSIS — M4716 Other spondylosis with myelopathy, lumbar region: Secondary | ICD-10-CM

## 2022-04-27 DIAGNOSIS — G8929 Other chronic pain: Secondary | ICD-10-CM | POA: Insufficient documentation

## 2022-04-27 DIAGNOSIS — Z79891 Long term (current) use of opiate analgesic: Secondary | ICD-10-CM | POA: Diagnosis not present

## 2022-04-27 DIAGNOSIS — M47816 Spondylosis without myelopathy or radiculopathy, lumbar region: Secondary | ICD-10-CM | POA: Diagnosis not present

## 2022-04-27 DIAGNOSIS — M5136 Other intervertebral disc degeneration, lumbar region: Secondary | ICD-10-CM

## 2022-04-27 DIAGNOSIS — R52 Pain, unspecified: Secondary | ICD-10-CM

## 2022-04-27 DIAGNOSIS — G894 Chronic pain syndrome: Secondary | ICD-10-CM | POA: Insufficient documentation

## 2022-04-27 MED ORDER — LIDOCAINE HCL (PF) 1 % IJ SOLN
INTRAMUSCULAR | Status: AC
  Filled 2022-04-27: qty 10

## 2022-04-27 MED ORDER — IOHEXOL 180 MG/ML  SOLN
10.0000 mL | Freq: Once | INTRAMUSCULAR | Status: AC | PRN
  Administered 2022-04-27: 10 mL via EPIDURAL

## 2022-04-27 MED ORDER — ROPIVACAINE HCL 2 MG/ML IJ SOLN
INTRAMUSCULAR | Status: AC
  Filled 2022-04-27: qty 20

## 2022-04-27 MED ORDER — XTAMPZA ER 36 MG PO C12A: 2.0000 | EXTENDED_RELEASE_CAPSULE | Freq: Two times a day (BID) | ORAL | 0 refills | Status: AC

## 2022-04-27 MED ORDER — TRIAMCINOLONE ACETONIDE 40 MG/ML IJ SUSP
INTRAMUSCULAR | Status: AC
  Filled 2022-04-27: qty 1

## 2022-04-27 MED ORDER — TIZANIDINE HCL 4 MG PO TABS: 4.0000 mg | ORAL_TABLET | Freq: Two times a day (BID) | ORAL | 1 refills | Status: DC

## 2022-04-27 MED ORDER — TRIAMCINOLONE ACETONIDE 40 MG/ML IJ SUSP
40.0000 mg | Freq: Once | INTRAMUSCULAR | Status: AC
  Administered 2022-04-27: 40 mg

## 2022-04-27 MED ORDER — ROPIVACAINE HCL 2 MG/ML IJ SOLN
10.0000 mL | Freq: Once | INTRAMUSCULAR | Status: AC
  Administered 2022-04-27: 10 mL via EPIDURAL

## 2022-04-27 MED ORDER — LIDOCAINE HCL (PF) 1 % IJ SOLN
5.0000 mL | Freq: Once | INTRAMUSCULAR | Status: AC
  Administered 2022-04-27: 5 mL via SUBCUTANEOUS

## 2022-04-27 MED ORDER — IOHEXOL 180 MG/ML  SOLN
INTRAMUSCULAR | Status: AC
  Filled 2022-04-27: qty 20

## 2022-04-27 MED ORDER — SODIUM CHLORIDE (PF) 0.9 % IJ SOLN
INTRAMUSCULAR | Status: AC
  Filled 2022-04-27: qty 10

## 2022-04-27 MED ORDER — SODIUM CHLORIDE 0.9% FLUSH
10.0000 mL | Freq: Once | INTRAVENOUS | Status: AC
  Administered 2022-04-27: 10 mL

## 2022-04-27 NOTE — Patient Instructions (Signed)

## 2022-04-27 NOTE — Progress Notes (Signed)
Nursing Pain Medication Assessment:  Safety precautions to be maintained throughout the outpatient stay will include: orient to surroundings, keep bed in low position, maintain call bell within reach at all times, provide assistance with transfer out of bed and ambulation. Nursing Pain Medication Assessment:  Safety precautions to be maintained throughout the outpatient stay will include: orient to surroundings, keep bed in low position, maintain call bell within reach at all times, provide assistance with transfer out of bed and ambulation.  Medication Inspection Compliance: Pill count conducted under aseptic conditions, in front of the patient. Neither the pills nor the bottle was removed from the patient's sight at any time. Once count was completed pills were immediately returned to the patient in their original bottle.  Medication:  Xtampza Pill/Patch Count:  62 of 120 pills remain Pill/Patch Appearance: Markings consistent with prescribed medication Bottle Appearance: Standard pharmacy container. Clearly labeled. Filled Date: 09 / 18 / 2023 Last Medication intake:  Today

## 2022-04-28 NOTE — Progress Notes (Signed)
Subjective:  Patient ID: Peter Lamb, male    DOB: 1955-09-28  Age: 66 y.o. MRN: [id number]  CC: Back Pain (lower)   Procedure: L5-S1 epidural steroid under fluoroscopic guidance with no sedation  HPI Peter Lamb presents for reevaluation.  Peter Lamb continues to have left side sciatica.  He has epidurals approximately every 3 to 4 months to keep this under control where he has failed more conservative therapy.  He continues to do his exercises including stretching strengthening and core physical therapy without significant improvement but this does help his mobility and strength.  He takes his Xtampza 2 tablets twice a day additionally to help keep his pain under control.  This combination works well for him.  No side effects are noted with his medication and he gets good relief from this.  He generally gets about 75 to 80% relief of his sciatica symptoms following epidural injection for about 2 months before he has gradual recurrence of the same pain.  Low back pain is chronic and generally unremitting though it is remedied with the epidurals by about 50% but still requiring chronic opioid management.  Outpatient Medications Prior to Visit  Medication Sig Dispense Refill   aspirin 81 MG tablet Take 162 mg by mouth daily.     atenolol (TENORMIN) 100 MG tablet Take 50 mg by mouth 2 (two) times daily.     glucosamine-chondroitin 500-400 MG tablet Take 1 tablet by mouth daily.     lisinopril (ZESTRIL) 10 MG tablet Take 10 mg by mouth 2 (two) times daily.     Melatonin 10 MG TABS Take by mouth at bedtime as needed.     metFORMIN (GLUCOPHAGE) 500 MG tablet Take 1,000 mg by mouth 2 (two) times daily with a meal. Reported on 02/10/2016     Multiple Vitamins-Minerals (VITAMINS TO GO MEN PO) Take 1 tablet by mouth daily.      nitroGLYCERIN (NITROSTAT) 0.4 MG SL tablet      Omega-3 Fatty Acids (FISH OIL) 1000 MG CAPS Take by mouth daily.     [START ON 05/13/2022] oxyCODONE ER (XTAMPZA ER) 36 MG  C12A Take 2 capsules (72 mg total) by mouth 2 (two) times daily. 120 capsule 0   rosuvastatin (CRESTOR) 10 MG tablet Take 10 mg by mouth daily.     tadalafil (CIALIS) 20 MG tablet Take 20 mg by mouth daily as needed.     TraZODone HCl 150 MG TB24 Take by mouth at bedtime.     oxyCODONE ER (XTAMPZA ER) 36 MG C12A Take 2 capsules (72 mg total) by mouth 2 (two) times daily. 120 capsule 0   tiZANidine (ZANAFLEX) 4 MG tablet TAKE 1 TABLET BY MOUTH 2 TIMES DAILY. 180 tablet 1   apixaban (ELIQUIS) 5 MG TABS tablet TAKE ONE TABLET BY MOUTH TWICE A DAY (CAUTION: BLOOD THINNER) (Patient not taking: Reported on 06/16/2021)     diltiazem (CARDIZEM CD) 240 MG 24 hr capsule Take 240 mg by mouth daily. (Patient not taking: Reported on 05/26/2021)     glipiZIDE (GLUCOTROL) 10 MG tablet Take 10 mg 2 (two) times daily by mouth. (Patient not taking: Reported on 05/26/2021)  3   VITAMIN E PO Take 90 Units by mouth daily. (Patient not taking: Reported on 05/26/2021)     No facility-administered medications prior to visit.    Review of Systems CNS: No confusion or sedation Cardiac: No angina or palpitations GI: No abdominal pain or constipation Constitutional: No nausea vomiting fevers or  chills  Objective:  BP (!) 151/100   Pulse 62   Temp 97.9 F (36.6 C) (Temporal)   Resp 15   Ht '5\' 10"'$  (1.778 m)   Wt 210 lb (95.3 kg)   SpO2 97%   BMI 30.13 kg/m    BP Readings from Last 3 Encounters:  04/27/22 (!) 151/100  10/02/21 (!) 148/95  07/07/21 123/82     Wt Readings from Last 3 Encounters:  04/27/22 210 lb (95.3 kg)  10/02/21 210 lb (95.3 kg)  07/07/21 213 lb (96.6 kg)     Physical Exam Pt is alert and oriented PERRL EOMI HEART IS RRR no murmur or rub LCTA no wheezing or rales MUSCULOSKELETAL reveals some paraspinous muscle tenderness but no overt trigger points are noted.  He walks with an antalgic gait.  He has a positive straight leg raise on the left side negative on the right.  Muscle  tone and bulk is at baseline.  Labs  Lab Results  Component Value Date   HGBA1C 9.0 03/04/2010   Lab Results  Component Value Date   CREATININE 0.92 08/21/2016    -------------------------------------------------------------------------------------------------------------------- Lab Results  Component Value Date   WBC 9.2 08/21/2016   HGB 15.3 08/21/2016   HCT 43.6 08/21/2016   PLT 219 08/21/2016   GLUCOSE 132 (H) 08/21/2016   ALT 31 03/04/2010   AST 23 03/04/2010   NA 135 08/21/2016   K 3.7 08/21/2016   CL 100 (L) 08/21/2016   CREATININE 0.92 08/21/2016   BUN 11 08/21/2016   CO2 26 08/21/2016   PSA 0.26 03/04/2010   HGBA1C 9.0 03/04/2010    --------------------------------------------------------------------------------------------------------------------- DG PAIN CLINIC C-ARM 1-60 MIN NO REPORT  Result Date: 04/27/2022 Fluoro was used, but no Radiologist interpretation will be provided. Please refer to "NOTES" tab for provider progress note.    Assessment & Plan:   Peter Lamb was seen today for back pain.  Diagnoses and all orders for this visit:  Chronic left-sided low back pain with left-sided sciatica  DDD (degenerative disc disease), lumbar -     Lumbar Epidural Injection -     triamcinolone acetonide (KENALOG-40) injection 40 mg -     sodium chloride flush (NS) 0.9 % injection 10 mL -     ropivacaine (PF) 2 mg/mL (0.2%) (NAROPIN) injection 10 mL -     lidocaine (PF) (XYLOCAINE) 1 % injection 5 mL -     iohexol (OMNIPAQUE) 180 MG/ML injection 10 mL  Lumbar spondylosis with myelopathy -     Lumbar Epidural Injection  Sciatica of left side associated with disorder of lumbosacral spine -     Lumbar Epidural Injection -     triamcinolone acetonide (KENALOG-40) injection 40 mg -     sodium chloride flush (NS) 0.9 % injection 10 mL -     ropivacaine (PF) 2 mg/mL (0.2%) (NAROPIN) injection 10 mL -     lidocaine (PF) (XYLOCAINE) 1 % injection 5 mL -      iohexol (OMNIPAQUE) 180 MG/ML injection 10 mL  Facet arthritis of lumbar region  Spondylosis of lumbar region without myelopathy or radiculopathy  Chronic pain syndrome -     ToxASSURE Select 13 (MW), Urine  Long term current use of opiate analgesic -     ToxASSURE Select 13 (MW), Urine  Other orders -     oxyCODONE ER (XTAMPZA ER) 36 MG C12A; Take 2 capsules (72 mg total) by mouth 2 (two) times daily. -     tiZANidine (  ZANAFLEX) 4 MG tablet; Take 1 tablet (4 mg total) by mouth 2 (two) times daily.        ----------------------------------------------------------------------------------------------------------------------  Problem List Items Addressed This Visit       Unprioritized   Chronic low back pain - Primary (Chronic)   Relevant Medications   oxyCODONE ER (XTAMPZA ER) 36 MG C12A (Start on 06/12/2022)   tiZANidine (ZANAFLEX) 4 MG tablet (Start on 06/15/2022)   Chronic pain (Chronic)   Relevant Medications   oxyCODONE ER (XTAMPZA ER) 36 MG C12A (Start on 06/12/2022)   tiZANidine (ZANAFLEX) 4 MG tablet (Start on 06/15/2022)   Other Relevant Orders   ToxASSURE Select 13 (MW), Urine   Long term current use of opiate analgesic (Chronic)   Relevant Orders   ToxASSURE Select 13 (MW), Urine   DDD (degenerative disc disease), lumbar   Relevant Medications   oxyCODONE ER (XTAMPZA ER) 36 MG C12A (Start on 06/12/2022)   tiZANidine (ZANAFLEX) 4 MG tablet (Start on 06/15/2022)   Sciatica of left side associated with disorder of lumbosacral spine   Relevant Medications   tiZANidine (ZANAFLEX) 4 MG tablet (Start on 06/15/2022)   Spondylosis of lumbar region without myelopathy or radiculopathy   Relevant Medications   oxyCODONE ER (XTAMPZA ER) 36 MG C12A (Start on 06/12/2022)   tiZANidine (ZANAFLEX) 4 MG tablet (Start on 06/15/2022)   Other Visit Diagnoses     Lumbar spondylosis with myelopathy       Relevant Medications   triamcinolone acetonide (KENALOG-40) injection 40  mg (Completed)   oxyCODONE ER (XTAMPZA ER) 36 MG C12A (Start on 06/12/2022)   tiZANidine (ZANAFLEX) 4 MG tablet (Start on 06/15/2022)   Facet arthritis of lumbar region       Relevant Medications   triamcinolone acetonide (KENALOG-40) injection 40 mg (Completed)   oxyCODONE ER (XTAMPZA ER) 36 MG C12A (Start on 06/12/2022)   tiZANidine (ZANAFLEX) 4 MG tablet (Start on 06/15/2022)         ----------------------------------------------------------------------------------------------------------------------  1. DDD (degenerative disc disease), lumbar Continue with epidural steroid injections approximately over 3 to 4 months to help with pain control.  This is working well for him.  Continue with his current medications management.  We gone over the risks and benefits of the procedure with him in full detail and he understands the procedure with no questions. - Lumbar Epidural Injection - triamcinolone acetonide (KENALOG-40) injection 40 mg - sodium chloride flush (NS) 0.9 % injection 10 mL - ropivacaine (PF) 2 mg/mL (0.2%) (NAROPIN) injection 10 mL - lidocaine (PF) (XYLOCAINE) 1 % injection 5 mL - iohexol (OMNIPAQUE) 180 MG/ML injection 10 mL  2. Lumbar spondylosis with myelopathy As above - Lumbar Epidural Injection  3. Sciatica of left side associated with disorder of lumbosacral spine As above - Lumbar Epidural Injection - triamcinolone acetonide (KENALOG-40) injection 40 mg - sodium chloride flush (NS) 0.9 % injection 10 mL - ropivacaine (PF) 2 mg/mL (0.2%) (NAROPIN) injection 10 mL - lidocaine (PF) (XYLOCAINE) 1 % injection 5 mL - iohexol (OMNIPAQUE) 180 MG/ML injection 10 mL  4. Chronic left-sided low back pain with left-sided sciatica As above  5. Facet arthritis of lumbar region Continue core strengthening exercises  6. Spondylosis of lumbar region without myelopathy or radiculopathy   7. Chronic pain syndrome I have reviewed the [facility] practitioner  database information is appropriate for refill. - ToxASSURE Select 13 (MW), Urine  8. Long term current use of opiate analgesic He is due for a UDS screen.  This  is for routine monitoring - ToxASSURE Select 13 (MW), Urine    ----------------------------------------------------------------------------------------------------------------------  I have changed Peter Lamb's tiZANidine. I am also having him maintain his aspirin, metFORMIN, atenolol, diltiazem, Multiple Vitamins-Minerals (VITAMINS TO GO MEN PO), rosuvastatin, TraZODone HCl, glipiZIDE, nitroGLYCERIN, Fish Oil, VITAMIN E PO, Melatonin, glucosamine-chondroitin, tadalafil, apixaban, lisinopril, Xtampza ER, and Xtampza ER. We administered triamcinolone acetonide, sodium chloride flush, ropivacaine (PF) 2 mg/mL (0.2%), lidocaine (PF), and iohexol.   Meds ordered this encounter  Medications   triamcinolone acetonide (KENALOG-40) injection 40 mg   sodium chloride flush (NS) 0.9 % injection 10 mL   ropivacaine (PF) 2 mg/mL (0.2%) (NAROPIN) injection 10 mL   lidocaine (PF) (XYLOCAINE) 1 % injection 5 mL   iohexol (OMNIPAQUE) 180 MG/ML injection 10 mL   oxyCODONE ER (XTAMPZA ER) 36 MG C12A    Sig: Take 2 capsules (72 mg total) by mouth 2 (two) times daily.    Dispense:  120 capsule    Refill:  0   tiZANidine (ZANAFLEX) 4 MG tablet    Sig: Take 1 tablet (4 mg total) by mouth 2 (two) times daily.    Dispense:  180 tablet    Refill:  1    NEW RX   Patient's Medications  New Prescriptions   No medications on file  Previous Medications   APIXABAN (ELIQUIS) 5 MG TABS TABLET    TAKE ONE TABLET BY MOUTH TWICE A DAY (CAUTION: BLOOD THINNER)   ASPIRIN 81 MG TABLET    Take 162 mg by mouth daily.   ATENOLOL (TENORMIN) 100 MG TABLET    Take 50 mg by mouth 2 (two) times daily.   DILTIAZEM (CARDIZEM CD) 240 MG 24 HR CAPSULE    Take 240 mg by mouth daily.   GLIPIZIDE (GLUCOTROL) 10 MG TABLET    Take 10 mg 2 (two) times daily by mouth.    GLUCOSAMINE-CHONDROITIN 500-400 MG TABLET    Take 1 tablet by mouth daily.   LISINOPRIL (ZESTRIL) 10 MG TABLET    Take 10 mg by mouth 2 (two) times daily.   MELATONIN 10 MG TABS    Take by mouth at bedtime as needed.   METFORMIN (GLUCOPHAGE) 500 MG TABLET    Take 1,000 mg by mouth 2 (two) times daily with a meal. Reported on 02/10/2016   MULTIPLE VITAMINS-MINERALS (VITAMINS TO GO MEN PO)    Take 1 tablet by mouth daily.    NITROGLYCERIN (NITROSTAT) 0.4 MG SL TABLET       OMEGA-3 FATTY ACIDS (FISH OIL) 1000 MG CAPS    Take by mouth daily.   OXYCODONE ER (XTAMPZA ER) 36 MG C12A    Take 2 capsules (72 mg total) by mouth 2 (two) times daily.   ROSUVASTATIN (CRESTOR) 10 MG TABLET    Take 10 mg by mouth daily.   TADALAFIL (CIALIS) 20 MG TABLET    Take 20 mg by mouth daily as needed.   TRAZODONE HCL 150 MG TB24    Take by mouth at bedtime.   VITAMIN E PO    Take 90 Units by mouth daily.  Modified Medications   Modified Medication Previous Medication   OXYCODONE ER (XTAMPZA ER) 36 MG C12A oxyCODONE ER (XTAMPZA ER) 36 MG C12A      Take 2 capsules (72 mg total) by mouth 2 (two) times daily.    Take 2 capsules (72 mg total) by mouth 2 (two) times daily.   TIZANIDINE (ZANAFLEX) 4 MG TABLET tiZANidine (ZANAFLEX) 4  MG tablet      Take 1 tablet (4 mg total) by mouth 2 (two) times daily.    TAKE 1 TABLET BY MOUTH 2 TIMES DAILY.  Discontinued Medications   No medications on file   ----------------------------------------------------------------------------------------------------------------------  Follow-up: Return in about 2 months (around 06/27/2022).   Procedure: L5-S1 LESI with fluoroscopic guidance and no moderate sedation  NOTE: The risks, benefits, and expectations of the procedure have been discussed and explained to the patient who was understanding and in agreement with suggested treatment plan. No guarantees were made.  DESCRIPTION OF PROCEDURE: Lumbar epidural steroid injection with no IV  Versed, EKG, blood pressure, pulse, and pulse oximetry monitoring. The procedure was performed with the patient in the prone position under fluoroscopic guidance.  Sterile prep x3 was initiated and I then injected subcutaneous lidocaine to the overlying L5-S1 site after its fluoroscopic identifictation.  Using strict aseptic technique, I then advanced an 18-gauge Tuohy epidural needle in the midline using interlaminar approach via loss-of-resistance to saline technique. There was negative aspiration for heme or  CSF.  I then confirmed position with both AP and Lateral fluoroscan.  2 cc of contrast dye were injected and a  total of 5 mL of Preservative-Free normal saline mixed with 40 mg of Kenalog and 1cc Ropicaine 0.2 percent were injected incrementally via the  epidurally placed needle. The needle was removed. The patient tolerated the injection well and was convalesced and discharged to home in stable condition. Should the patient have any post procedure difficulty they have been instructed on how to contact us for assistance.    Molli Barrows, MD

## 2022-04-30 LAB — TOXASSURE SELECT 13 (MW), URINE

## 2022-07-13 ENCOUNTER — Ambulatory Visit: Payer: Medicare Other | Attending: Anesthesiology | Admitting: Anesthesiology

## 2022-07-13 ENCOUNTER — Telehealth: Payer: Self-pay

## 2022-07-13 DIAGNOSIS — M5442 Lumbago with sciatica, left side: Secondary | ICD-10-CM

## 2022-07-13 DIAGNOSIS — M4716 Other spondylosis with myelopathy, lumbar region: Secondary | ICD-10-CM

## 2022-07-13 DIAGNOSIS — M47816 Spondylosis without myelopathy or radiculopathy, lumbar region: Secondary | ICD-10-CM

## 2022-07-13 DIAGNOSIS — M5387 Other specified dorsopathies, lumbosacral region: Secondary | ICD-10-CM

## 2022-07-13 DIAGNOSIS — M7918 Myalgia, other site: Secondary | ICD-10-CM

## 2022-07-13 DIAGNOSIS — M792 Neuralgia and neuritis, unspecified: Secondary | ICD-10-CM

## 2022-07-13 DIAGNOSIS — M5136 Other intervertebral disc degeneration, lumbar region: Secondary | ICD-10-CM

## 2022-07-13 DIAGNOSIS — G894 Chronic pain syndrome: Secondary | ICD-10-CM

## 2022-07-13 DIAGNOSIS — Z79891 Long term (current) use of opiate analgesic: Secondary | ICD-10-CM

## 2022-07-13 DIAGNOSIS — G8929 Other chronic pain: Secondary | ICD-10-CM

## 2022-07-13 MED ORDER — TIZANIDINE HCL 4 MG PO TABS: 4.0000 mg | ORAL_TABLET | Freq: Two times a day (BID) | ORAL | 5 refills | Status: DC

## 2022-07-13 MED ORDER — XTAMPZA ER 36 MG PO C12A: 2.0000 | EXTENDED_RELEASE_CAPSULE | Freq: Two times a day (BID) | ORAL | 0 refills | Status: DC

## 2022-07-13 MED ORDER — XTAMPZA ER 36 MG PO C12A: 2.0000 | EXTENDED_RELEASE_CAPSULE | Freq: Two times a day (BID) | ORAL | 0 refills | Status: AC

## 2022-07-13 NOTE — Progress Notes (Signed)
Virtual Visit via Telephone Note  I connected with Peter Lamb on 07/13/22 at 11:00 AM EST by telephone and verified that I am speaking with the correct person using two identifiers.  Location: Patient: Home Provider: [facility] control center   I discussed the limitations, risks, security and privacy concerns of performing an evaluation and management service by telephone and the availability of in person appointments. I also discussed with the patient that there may be a patient responsible charge related to this service. The patient expressed understanding and agreed to proceed.   History of Present Illness: I spoke with Peter Lamb via telephone as we were unable like for the video portion of This.  He reports that he has been doing reasonably well in regards to his low back pain but is having recurrent sciatica on the left side.  This is the same basic pain that recurs about every 3 to 4 months following his epidural.  His last injections were in March and September 2023.  He generally goes about 8 to 10 weeks before he gets a gradual slow recurrence of the similar pain.  He has requested setting up a return appointment for February and repeat epidural which is consistent with his baseline.  He reports about 60 to 70% improvement in his low back and 90% improvement in his left lower leg pain following the injection generally lasting 8 to 10 weeks before he gets a gradual recurrence of similar pain.  This recurs despite efforts at home stretching strengthening physical therapy like exercises.  Unfortunately the only thing that has worked for him is medication management and epidurals.  He is a nonsurgical candidate.  No change in lower extremity strength function bowel or bladder function.  He is tolerating his medications well and generally gets about 75% improvement with his low back pain with the Xtampza.  No side effects reported.  Review of systems: General: No fevers or chills Pulmonary: No  shortness of breath or dyspnea Cardiac: No angina or palpitations or lightheadedness GI: No abdominal pain or constipation Psych: No depression    Observations/Objective:  Current Outpatient Medications:    oxyCODONE ER (XTAMPZA ER) 36 MG C12A, Take 2 capsules (72 mg total) by mouth 2 (two) times daily., Disp: 120 capsule, Rfl: 0   [START ON 08/12/2022] oxyCODONE ER (XTAMPZA ER) 36 MG C12A, Take 2 capsules (72 mg total) by mouth 2 (two) times daily., Disp: 120 capsule, Rfl: 0   apixaban (ELIQUIS) 5 MG TABS tablet, TAKE ONE TABLET BY MOUTH TWICE A DAY (CAUTION: BLOOD THINNER) (Patient not taking: Reported on 06/16/2021), Disp: , Rfl:    aspirin 81 MG tablet, Take 162 mg by mouth daily., Disp: , Rfl:    atenolol (TENORMIN) 100 MG tablet, Take 50 mg by mouth 2 (two) times daily., Disp: , Rfl:    diltiazem (CARDIZEM CD) 240 MG 24 hr capsule, Take 240 mg by mouth daily. (Patient not taking: Reported on 05/26/2021), Disp: , Rfl:    glipiZIDE (GLUCOTROL) 10 MG tablet, Take 10 mg 2 (two) times daily by mouth. (Patient not taking: Reported on 05/26/2021), Disp: , Rfl: 3   glucosamine-chondroitin 500-400 MG tablet, Take 1 tablet by mouth daily., Disp: , Rfl:    lisinopril (ZESTRIL) 10 MG tablet, Take 10 mg by mouth 2 (two) times daily., Disp: , Rfl:    Melatonin 10 MG TABS, Take by mouth at bedtime as needed., Disp: , Rfl:    metFORMIN (GLUCOPHAGE) 500 MG tablet, Take 1,000 mg by  mouth 2 (two) times daily with a meal. Reported on 02/10/2016, Disp: , Rfl:    Multiple Vitamins-Minerals (VITAMINS TO GO MEN PO), Take 1 tablet by mouth daily. , Disp: , Rfl:    nitroGLYCERIN (NITROSTAT) 0.4 MG SL tablet, , Disp: , Rfl:    Omega-3 Fatty Acids (FISH OIL) 1000 MG CAPS, Take by mouth daily., Disp: , Rfl:    rosuvastatin (CRESTOR) 10 MG tablet, Take 10 mg by mouth daily., Disp: , Rfl:    tadalafil (CIALIS) 20 MG tablet, Take 20 mg by mouth daily as needed., Disp: , Rfl:    tiZANidine (ZANAFLEX) 4 MG tablet, Take 1  tablet (4 mg total) by mouth 2 (two) times daily., Disp: 180 tablet, Rfl: 5   TraZODone HCl 150 MG TB24, Take by mouth at bedtime., Disp: , Rfl:    VITAMIN E PO, Take 90 Units by mouth daily. (Patient not taking: Reported on 05/26/2021), Disp: , Rfl:    Past Medical History:  Diagnosis Date   Arthritis    back, neck   Carpal boss of right wrist    CHF (congestive heart failure) ([place])    Depression    Diabetes mellitus without complication (HCC)    Dysplastic nevus 10/08/2021   superior med buttocks - Moderate   GERD (gastroesophageal reflux disease)    H/O tooth extraction    Hyperlipidemia    Hypertension      Assessment and Plan: 1. DDD (degenerative disc disease), lumbar   2. Lumbar spondylosis with myelopathy   3. 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 Chronic left-sided low back pain with left-sided sciatica   5. Facet arthritis of lumbar region   6. Spondylosis of lumbar region without myelopathy or radiculopathy   7. Chronic pain syndrome   8. Long term current use of opiate analgesic   9. Musculoskeletal pain   10. Neurogenic pain   Based on our discussion today and upon review of the [facility] practitioner database information it is appropriate to refill his medicines for the next 2 months.  Refill will be dated for today and 1 month from today.  He recently had a urine screen which was appropriate as well.  Based on his current symptom complex I think it is appropriate to reschedule him for 11-monthreturn and an epidural at that time.  In the meantime continue with stretching strengthening exercises.  Continue follow-up with his primary care physicians for baseline medical care.  Follow Up Instructions:    I discussed the assessment and treatment plan with the patient. The patient was provided an opportunity to ask questions and all were answered. The patient agreed with the plan and demonstrated an understanding of the  instructions.   The patient was advised to call back or seek an in-person evaluation if the symptoms worsen or if the condition fails to improve as anticipated.  I provided 30 minutes of non-face-to-face time during this encounter.   JMolli Barrows MD

## 2022-07-13 NOTE — Telephone Encounter (Signed)
Called Dr. Andree Elk and explained the situation, he agreed to do VV today. Patient notified.

## 2022-07-13 NOTE — Telephone Encounter (Signed)
The patient states he should have another script at the pharmacy but he does not, he wants you to call him please

## 2022-07-13 NOTE — Telephone Encounter (Signed)
There was no script written for 06/2022. Was appt on 04/2022 instructed patient to return in 2 months.Marland Kitchen

## 2022-09-02 ENCOUNTER — Ambulatory Visit (HOSPITAL_BASED_OUTPATIENT_CLINIC_OR_DEPARTMENT_OTHER): Payer: Medicare Other | Admitting: Anesthesiology

## 2022-09-02 ENCOUNTER — Encounter: Payer: Self-pay | Admitting: Anesthesiology

## 2022-09-02 ENCOUNTER — Other Ambulatory Visit: Payer: Self-pay | Admitting: Anesthesiology

## 2022-09-02 ENCOUNTER — Ambulatory Visit
Admission: RE | Admit: 2022-09-02 | Discharge: 2022-09-02 | Disposition: A | Discharge status: Home / Self Care | Payer: Medicare Other | Source: Ambulatory Visit | Attending: Anesthesiology | Admitting: Anesthesiology

## 2022-09-02 VITALS — BP 118/85 | HR 61 | Temp 98.4°F | Resp 16 | Ht 70.0 in | Wt 210.0 lb

## 2022-09-02 DIAGNOSIS — M5442 Lumbago with sciatica, left side: Secondary | ICD-10-CM | POA: Insufficient documentation

## 2022-09-02 DIAGNOSIS — G894 Chronic pain syndrome: Secondary | ICD-10-CM

## 2022-09-02 DIAGNOSIS — M5387 Other specified dorsopathies, lumbosacral region: Secondary | ICD-10-CM | POA: Insufficient documentation

## 2022-09-02 DIAGNOSIS — G8929 Other chronic pain: Secondary | ICD-10-CM | POA: Diagnosis present

## 2022-09-02 DIAGNOSIS — M5136 Other intervertebral disc degeneration, lumbar region: Secondary | ICD-10-CM

## 2022-09-02 DIAGNOSIS — M47816 Spondylosis without myelopathy or radiculopathy, lumbar region: Secondary | ICD-10-CM

## 2022-09-02 DIAGNOSIS — M7918 Myalgia, other site: Secondary | ICD-10-CM

## 2022-09-02 DIAGNOSIS — M4716 Other spondylosis with myelopathy, lumbar region: Secondary | ICD-10-CM | POA: Insufficient documentation

## 2022-09-02 DIAGNOSIS — M51369 Other intervertebral disc degeneration, lumbar region without mention of lumbar back pain or lower extremity pain: Secondary | ICD-10-CM

## 2022-09-02 MED ORDER — XTAMPZA ER 36 MG PO C12A: 2.0000 | EXTENDED_RELEASE_CAPSULE | Freq: Two times a day (BID) | ORAL | 0 refills | Status: DC

## 2022-09-02 MED ORDER — LIDOCAINE HCL (PF) 1 % IJ SOLN
INTRAMUSCULAR | Status: AC
  Filled 2022-09-02: qty 10

## 2022-09-02 MED ORDER — XTAMPZA ER 36 MG PO C12A: 2.0000 | EXTENDED_RELEASE_CAPSULE | Freq: Two times a day (BID) | ORAL | 0 refills | Status: AC

## 2022-09-02 MED ORDER — ROPIVACAINE HCL 2 MG/ML IJ SOLN
10.0000 mL | Freq: Once | INTRAMUSCULAR | Status: AC
  Administered 2022-09-02: 1 mL via EPIDURAL

## 2022-09-02 MED ORDER — ROPIVACAINE HCL 2 MG/ML IJ SOLN: 10.0000 mL | Freq: Once | INTRAMUSCULAR | Status: DC

## 2022-09-02 MED ORDER — ROPIVACAINE HCL 2 MG/ML IJ SOLN
INTRAMUSCULAR | Status: AC
  Filled 2022-09-02: qty 20

## 2022-09-02 MED ORDER — IOHEXOL 180 MG/ML  SOLN
10.0000 mL | Freq: Once | INTRAMUSCULAR | Status: AC | PRN
  Administered 2022-09-02: 10 mL via EPIDURAL

## 2022-09-02 MED ORDER — LIDOCAINE HCL (PF) 1 % IJ SOLN
5.0000 mL | Freq: Once | INTRAMUSCULAR | Status: AC
  Administered 2022-09-02: 5 mL via SUBCUTANEOUS

## 2022-09-02 MED ORDER — IOHEXOL 180 MG/ML  SOLN
10.0000 mL | Freq: Once | INTRAMUSCULAR | Status: DC | PRN
  Filled 2022-09-02: qty 20

## 2022-09-02 MED ORDER — LIDOCAINE HCL (PF) 1 % IJ SOLN: 5.0000 mL | Freq: Once | INTRAMUSCULAR | Status: DC

## 2022-09-02 MED ORDER — SODIUM CHLORIDE 0.9% FLUSH
10.0000 mL | Freq: Once | INTRAVENOUS | Status: AC
  Administered 2022-09-02: 10 mL

## 2022-09-02 MED ORDER — TRIAMCINOLONE ACETONIDE 40 MG/ML IJ SUSP
INTRAMUSCULAR | Status: AC
  Filled 2022-09-02: qty 1

## 2022-09-02 MED ORDER — TRIAMCINOLONE ACETONIDE 40 MG/ML IJ SUSP: 40.0000 mg | Freq: Once | INTRAMUSCULAR | Status: DC

## 2022-09-02 MED ORDER — SODIUM CHLORIDE 0.9% FLUSH: 10.0000 mL | Freq: Once | INTRAVENOUS | Status: DC

## 2022-09-02 MED ORDER — SODIUM CHLORIDE (PF) 0.9 % IJ SOLN
INTRAMUSCULAR | Status: AC
  Filled 2022-09-02: qty 10

## 2022-09-02 MED ORDER — TRIAMCINOLONE ACETONIDE 40 MG/ML IJ SUSP
40.0000 mg | Freq: Once | INTRAMUSCULAR | Status: AC
  Administered 2022-09-02: 40 mg

## 2022-09-02 NOTE — Patient Instructions (Signed)
Pain Management Discharge Instructions  General Discharge Instructions :  If you need to reach your doctor call: Monday-Friday 8:00 am - 4:00 pm at [phone] or toll free [phone].  After clinic hours [phone] to have operator reach doctor.  Bring all of your medication bottles to all your appointments in the pain clinic.  To cancel or reschedule your appointment with Pain Management please remember to call 24 hours in advance to avoid a fee.  Refer to the educational materials which you have been given on: General Risks, I had my Procedure. Discharge Instructions, Post Sedation.  Post Procedure Instructions:  The drugs you were given will stay in your system until tomorrow, so for the next 24 hours you should not drive, make any legal decisions or drink any alcoholic beverages.  You may eat anything you prefer, but it is better to start with liquids then soups and crackers, and gradually work up to solid foods.  Please notify your doctor immediately if you have any unusual bleeding, trouble breathing or pain that is not related to your normal pain.  Depending on the type of procedure that was done, some parts of your body may feel week and/or numb.  This usually clears up by tonight or the next day.  Walk with the use of an assistive device or accompanied by an adult for the 24 hours.  You may use ice on the affected area for the first 24 hours.  Put ice in a Ziploc bag and cover with a towel and place against area 15 minutes on 15 minutes off.  You may switch to heat after 24 hours.Epidural Steroid Injection Patient Information  Description: The epidural space surrounds the nerves as they exit the spinal cord.  In some patients, the nerves can be compressed and inflamed by a bulging disc or a tight spinal canal (spinal stenosis).  By injecting steroids into the epidural space, we can bring irritated nerves into direct contact with a potentially helpful medication.  These  steroids act directly on the irritated nerves and can reduce swelling and inflammation which often leads to decreased pain.  Epidural steroids may be injected anywhere along the spine and from the neck to the low back depending upon the location of your pain.   After numbing the skin with local anesthetic (like Novocaine), a small needle is passed into the epidural space slowly.  You may experience a sensation of pressure while this is being done.  The entire block usually last less than 10 minutes.  Conditions which may be treated by epidural steroids:  Low back and leg pain Neck and arm pain Spinal stenosis Post-laminectomy syndrome Herpes zoster (shingles) pain Pain from compression fractures  Preparation for the injection:  Do not eat any solid food or dairy products within 8 hours of your appointment.  You may drink clear liquids up to 3 hours before appointment.  Clear liquids include water, black coffee, juice or soda.  No milk or cream please. You may take your regular medication, including pain medications, with a sip of water before your appointment  Diabetics should hold regular insulin (if taken separately) and take 1/2 normal NPH dos the morning of the procedure.  Carry some sugar containing items with you to your appointment. A driver must accompany you and be prepared to drive you home after your procedure.  Bring all your current medications with your. An IV may be inserted and sedation may be given at the discretion of the physician.  A blood pressure cuff, EKG and other monitors will often be applied during the procedure.  Some patients may need to have extra oxygen administered for a short period. You will be asked to provide medical information, including your allergies, prior to the procedure.  We must know immediately if you are taking blood thinners (like Coumadin/Warfarin)  Or if you are allergic to IV iodine contrast (dye). We must know if you could possible be  pregnant.  Possible side-effects: Bleeding from needle site Infection (rare, may require surgery) Nerve injury (rare) Numbness & tingling (temporary) Difficulty urinating (rare, temporary) Spinal headache ( a headache worse with upright posture) Light -headedness (temporary) Pain at injection site (several days) Decreased blood pressure (temporary) Weakness in arm/leg (temporary) Pressure sensation in back/neck (temporary)  Call if you experience: Fever/chills associated with headache or increased back/neck pain. Headache worsened by an upright position. New onset weakness or numbness of an extremity below the injection site Hives or difficulty breathing (go to the emergency room) Inflammation or drainage at the infection site Severe back/neck pain Any new symptoms which are concerning to you  Please note:  Although the local anesthetic injected can often make your back or neck feel good for several hours after the injection, the pain will likely return.  It takes 3-7 days for steroids to work in the epidural space.  You may not notice any pain relief for at least that one week.  If effective, we will often do a series of three injections spaced 3-6 weeks apart to maximally decrease your pain.  After the initial series, we generally will wait several months before considering a repeat injection of the same type.  If you have any questions, please call [phone] [facility]

## 2022-09-02 NOTE — Progress Notes (Signed)
Nursing Pain Medication Assessment:  Safety precautions to be maintained throughout the outpatient stay will include: orient to surroundings, keep bed in low position, maintain call bell within reach at all times, provide assistance with transfer out of bed and ambulation. Nursing Pain Medication Assessment:  Safety precautions to be maintained throughout the outpatient stay will include: orient to surroundings, keep bed in low position, maintain call bell within reach at all times, provide assistance with transfer out of bed and ambulation.  Medication Inspection Compliance: Pill count conducted under aseptic conditions, in front of the patient. Neither the pills nor the bottle was removed from the patient's sight at any time. Once count was completed pills were immediately returned to the patient in their original bottle.  Medication:  Xtampza Pill/Patch Count:  34 of 120 pills remain Pill/Patch Appearance: Markings consistent with prescribed medication Bottle Appearance: Standard pharmacy container. Clearly labeled. Filled Date: 01 / 17 / 2023 Last Medication intake:  Today

## 2022-09-03 ENCOUNTER — Telehealth: Payer: Self-pay

## 2022-09-03 NOTE — Telephone Encounter (Signed)
Post procedure follow up. lm

## 2022-09-04 NOTE — Progress Notes (Signed)
Subjective:  Patient ID: Peter Lamb, male    DOB: [birth year]  Age: 67 y.o. MRN: [id number]  CC: Back Pain (lower)   Procedure: L5-S1 epidural steroid and fluoroscopic guidance without sedation  HPI KENARD BOEDER presents for reevaluation.  He is continue to have severe left side sciatica that has been intensified over the past month.  His last epidural was in September of last year.  At that time he reported a nearly 100% improvement in his low back pain and 100% improvement in his sciatica symptoms lasting 2 months tolerated gradual recurrence in late November to early December although not as severe.  He reports that over the past 4 weeks the pain has intensified to the point where he is seeking a repeat epidural.  He has done well with these in the past.  He continues to get his medicines as prescribed and these continue to help with the generalized low back pain hip pain and buttocks pain.  Otherwise she is in his usual state of health.  No side effects with the medications are reported.  No change in lower extremity strength or function or bowel or bladder function is noted.  Outpatient Medications Prior to Visit  Medication Sig Dispense Refill   aspirin 81 MG tablet Take 162 mg by mouth daily.     atenolol (TENORMIN) 100 MG tablet Take 50 mg by mouth 2 (two) times daily.     glucosamine-chondroitin 500-400 MG tablet Take 1 tablet by mouth daily.     lisinopril (ZESTRIL) 10 MG tablet Take 10 mg by mouth 2 (two) times daily.     Melatonin 10 MG TABS Take by mouth at bedtime as needed.     metFORMIN (GLUCOPHAGE) 500 MG tablet Take 1,000 mg by mouth 2 (two) times daily with a meal. Reported on 02/10/2016     Multiple Vitamins-Minerals (VITAMINS TO GO MEN PO) Take 1 tablet by mouth daily.      nitroGLYCERIN (NITROSTAT) 0.4 MG SL tablet      Omega-3 Fatty Acids (FISH OIL) 1000 MG CAPS Take by mouth daily.     rosuvastatin (CRESTOR) 10 MG tablet Take 10 mg by mouth daily.     tadalafil  (CIALIS) 20 MG tablet Take 20 mg by mouth daily as needed.     tiZANidine (ZANAFLEX) 4 MG tablet Take 1 tablet (4 mg total) by mouth 2 (two) times daily. 180 tablet 5   TraZODone HCl 150 MG TB24 Take by mouth at bedtime.     VITAMIN E PO Take 90 Units by mouth daily.     oxyCODONE ER (XTAMPZA ER) 36 MG C12A Take 2 capsules (72 mg total) by mouth 2 (two) times daily. 120 capsule 0   apixaban (ELIQUIS) 5 MG TABS tablet TAKE ONE TABLET BY MOUTH TWICE A DAY (CAUTION: BLOOD THINNER) (Patient not taking: Reported on 06/16/2021)     diltiazem (CARDIZEM CD) 240 MG 24 hr capsule Take 240 mg by mouth daily. (Patient not taking: Reported on 05/26/2021)     glipiZIDE (GLUCOTROL) 10 MG tablet Take 10 mg 2 (two) times daily by mouth. (Patient not taking: Reported on 05/26/2021)  3   No facility-administered medications prior to visit.    Review of Systems CNS: No confusion or sedation Cardiac: No angina or palpitations GI: No abdominal pain or constipation Constitutional: No nausea vomiting fevers or chills  Objective:  BP 118/85   Pulse 61   Temp 98.4 F (36.9 C) (Temporal)   Resp  16   Ht 5' 10"$  (1.778 m)   Wt 210 lb (95.3 kg)   SpO2 95%   BMI 30.13 kg/m    BP Readings from Last 3 Encounters:  09/02/22 118/85  04/27/22 (!) 151/100  10/02/21 (!) 148/95     Wt Readings from Last 3 Encounters:  09/02/22 210 lb (95.3 kg)  04/27/22 210 lb (95.3 kg)  10/02/21 210 lb (95.3 kg)     Physical Exam Pt is alert and oriented PERRL EOMI HEART IS RRR no murmur or rub LCTA no wheezing or rales MUSCULOSKELETAL reveals some paraspinous muscle tenderness but no overt trigger points.  He walks with an antalgic gait.  He has a positive straight leg raise on the left side negative on the right.  Tone and bulk is at baseline.  Labs  Lab Results  Component Value Date   HGBA1C 9.0 03/04/2010   Lab Results  Component Value Date   CREATININE 0.92 08/21/2016     -------------------------------------------------------------------------------------------------------------------- Lab Results  Component Value Date   WBC 9.2 08/21/2016   HGB 15.3 08/21/2016   HCT 43.6 08/21/2016   PLT 219 08/21/2016   GLUCOSE 132 (H) 08/21/2016   ALT 31 03/04/2010   AST 23 03/04/2010   NA 135 08/21/2016   K 3.7 08/21/2016   CL 100 (L) 08/21/2016   CREATININE 0.92 08/21/2016   BUN 11 08/21/2016   CO2 26 08/21/2016   PSA 0.26 03/04/2010   HGBA1C 9.0 03/04/2010    --------------------------------------------------------------------------------------------------------------------- DG PAIN CLINIC C-ARM 1-60 MIN NO REPORT  Result Date: 09/02/2022 Fluoro was used, but no Radiologist interpretation will be provided. Please refer to "NOTES" tab for provider progress note.    Assessment & Plan:   Kaid was seen today for back pain.  Diagnoses and all orders for this visit:  Facet arthritis of lumbar region  DDD (degenerative disc disease), lumbar -     Lumbar Epidural Injection  Lumbar spondylosis with myelopathy -     Lumbar Epidural Injection  Sciatica of left side associated with disorder of lumbosacral spine -     Lumbar Epidural Injection  Chronic left-sided low back pain with left-sided sciatica -     Lumbar Epidural Injection  Spondylosis of lumbar region without myelopathy or radiculopathy  Chronic pain syndrome  Musculoskeletal pain  Other orders -     triamcinolone acetonide (KENALOG-40) injection 40 mg -     sodium chloride flush (NS) 0.9 % injection 10 mL -     ropivacaine (PF) 2 mg/mL (0.2%) (NAROPIN) injection 10 mL -     lidocaine (PF) (XYLOCAINE) 1 % injection 5 mL -     iohexol (OMNIPAQUE) 180 MG/ML injection 10 mL -     triamcinolone acetonide (KENALOG-40) injection 40 mg -     sodium chloride flush (NS) 0.9 % injection 10 mL -     ropivacaine (PF) 2 mg/mL (0.2%) (NAROPIN) injection 10 mL -     lidocaine (PF) (XYLOCAINE)  1 % injection 5 mL -     iohexol (OMNIPAQUE) 180 MG/ML injection 10 mL -     oxyCODONE ER (XTAMPZA ER) 36 MG C12A; Take 2 capsules (72 mg total) by mouth 2 (two) times daily. -     oxyCODONE ER (XTAMPZA ER) 36 MG C12A; Take 2 capsules (72 mg total) by mouth 2 (two) times daily.        ----------------------------------------------------------------------------------------------------------------------  Problem List Items Addressed This Visit       Unprioritized  Chronic low back pain (Chronic)   Relevant Medications   lidocaine (PF) (XYLOCAINE) 1 % injection 5 mL   triamcinolone acetonide (KENALOG-40) injection 40 mg   ropivacaine (PF) 2 mg/mL (0.2%) (NAROPIN) injection 10 mL   oxyCODONE ER (XTAMPZA ER) 36 MG C12A (Start on 09/11/2022)   oxyCODONE ER (XTAMPZA ER) 36 MG C12A (Start on 10/11/2022)   Chronic pain (Chronic)   Relevant Medications   lidocaine (PF) (XYLOCAINE) 1 % injection 5 mL   triamcinolone acetonide (KENALOG-40) injection 40 mg   ropivacaine (PF) 2 mg/mL (0.2%) (NAROPIN) injection 10 mL   oxyCODONE ER (XTAMPZA ER) 36 MG C12A (Start on 09/11/2022)   oxyCODONE ER (XTAMPZA ER) 36 MG C12A (Start on 10/11/2022)   Musculoskeletal pain (Chronic)   DDD (degenerative disc disease), lumbar   Relevant Medications   triamcinolone acetonide (KENALOG-40) injection 40 mg   oxyCODONE ER (XTAMPZA ER) 36 MG C12A (Start on 09/11/2022)   oxyCODONE ER (XTAMPZA ER) 36 MG C12A (Start on 10/11/2022)   Sciatica of left side associated with disorder of lumbosacral spine   Spondylosis of lumbar region without myelopathy or radiculopathy   Relevant Medications   triamcinolone acetonide (KENALOG-40) injection 40 mg   oxyCODONE ER (XTAMPZA ER) 36 MG C12A (Start on 09/11/2022)   oxyCODONE ER (XTAMPZA ER) 36 MG C12A (Start on 10/11/2022)   Other Visit Diagnoses     Facet arthritis of lumbar region    -  Primary   Relevant Medications   triamcinolone acetonide (KENALOG-40) injection 40 mg  (Completed)   triamcinolone acetonide (KENALOG-40) injection 40 mg   oxyCODONE ER (XTAMPZA ER) 36 MG C12A (Start on 09/11/2022)   oxyCODONE ER (XTAMPZA ER) 36 MG C12A (Start on 10/11/2022)   Lumbar spondylosis with myelopathy       Relevant Medications   triamcinolone acetonide (KENALOG-40) injection 40 mg (Completed)   triamcinolone acetonide (KENALOG-40) injection 40 mg   oxyCODONE ER (XTAMPZA ER) 36 MG C12A (Start on 09/11/2022)   oxyCODONE ER (XTAMPZA ER) 36 MG C12A (Start on 10/11/2022)         ----------------------------------------------------------------------------------------------------------------------  1. DDD (degenerative disc disease), lumbar We will proceed with a repeat epidural today.  He has done well with these in the past and these are enabling him to continue to be active and have a normal daily lifestyle as reported today.  Furthermore he is taking his medications as prescribed and these are working well for him as well.  No side effects reported. - Lumbar Epidural Injection  2. Lumbar spondylosis with myelopathy Continue core stretching strengthening exercises and efforts at weight loss. - Lumbar Epidural Injection  3. Sciatica of left side associated with disorder of lumbosacral spine As above - Lumbar Epidural Injection  4. Chronic left-sided low back pain with left-sided sciatica As above - Lumbar Epidural Injection  5. Facet arthritis of lumbar region   6. Spondylosis of lumbar region without myelopathy or radiculopathy   7. Chronic pain syndrome I have reviewed the [facility] practitioner database information is appropriate for refills which be dated for February 16 and March 17.  He is scheduled for return to clinic in 2 months.  Continue follow-up with his primary care physician for baseline medical care.  8. Musculoskeletal pain As  above    ----------------------------------------------------------------------------------------------------------------------  I am having Edwardo N. Ferrufino start on [facility]. I am also having him maintain his aspirin, metFORMIN, atenolol, diltiazem, Multiple Vitamins-Minerals (VITAMINS TO GO MEN PO), rosuvastatin, TraZODone HCl, glipiZIDE, nitroGLYCERIN, Fish  Oil, VITAMIN E PO, Melatonin, glucosamine-chondroitin, tadalafil, apixaban, lisinopril, tiZANidine, and Xtampza ER. We administered triamcinolone acetonide, sodium chloride flush, ropivacaine (PF) 2 mg/mL (0.2%), lidocaine (PF), and iohexol.   Meds ordered this encounter  Medications   triamcinolone acetonide (KENALOG-40) injection 40 mg   sodium chloride flush (NS) 0.9 % injection 10 mL   ropivacaine (PF) 2 mg/mL (0.2%) (NAROPIN) injection 10 mL   lidocaine (PF) (XYLOCAINE) 1 % injection 5 mL   iohexol (OMNIPAQUE) 180 MG/ML injection 10 mL   triamcinolone acetonide (KENALOG-40) injection 40 mg   sodium chloride flush (NS) 0.9 % injection 10 mL   ropivacaine (PF) 2 mg/mL (0.2%) (NAROPIN) injection 10 mL   lidocaine (PF) (XYLOCAINE) 1 % injection 5 mL   iohexol (OMNIPAQUE) 180 MG/ML injection 10 mL   oxyCODONE ER (XTAMPZA ER) 36 MG C12A    Sig: Take 2 capsules (72 mg total) by mouth 2 (two) times daily.    Dispense:  120 capsule    Refill:  0   oxyCODONE ER (XTAMPZA ER) 36 MG C12A    Sig: Take 2 capsules (72 mg total) by mouth 2 (two) times daily.    Dispense:  120 capsule    Refill:  0   Patient's Medications  New Prescriptions   OXYCODONE ER (XTAMPZA ER) 36 MG C12A    Take 2 capsules (72 mg total) by mouth 2 (two) times daily.  Previous Medications   APIXABAN (ELIQUIS) 5 MG TABS TABLET    TAKE ONE TABLET BY MOUTH TWICE A DAY (CAUTION: BLOOD THINNER)   ASPIRIN 81 MG TABLET    Take 162 mg by mouth daily.   ATENOLOL (TENORMIN) 100 MG TABLET    Take 50 mg by mouth 2 (two) times daily.   DILTIAZEM (CARDIZEM CD) 240 MG 24 HR  CAPSULE    Take 240 mg by mouth daily.   GLIPIZIDE (GLUCOTROL) 10 MG TABLET    Take 10 mg 2 (two) times daily by mouth.   GLUCOSAMINE-CHONDROITIN 500-400 MG TABLET    Take 1 tablet by mouth daily.   LISINOPRIL (ZESTRIL) 10 MG TABLET    Take 10 mg by mouth 2 (two) times daily.   MELATONIN 10 MG TABS    Take by mouth at bedtime as needed.   METFORMIN (GLUCOPHAGE) 500 MG TABLET    Take 1,000 mg by mouth 2 (two) times daily with a meal. Reported on 02/10/2016   MULTIPLE VITAMINS-MINERALS (VITAMINS TO GO MEN PO)    Take 1 tablet by mouth daily.    NITROGLYCERIN (NITROSTAT) 0.4 MG SL TABLET       OMEGA-3 FATTY ACIDS (FISH OIL) 1000 MG CAPS    Take by mouth daily.   ROSUVASTATIN (CRESTOR) 10 MG TABLET    Take 10 mg by mouth daily.   TADALAFIL (CIALIS) 20 MG TABLET    Take 20 mg by mouth daily as needed.   TIZANIDINE (ZANAFLEX) 4 MG TABLET    Take 1 tablet (4 mg total) by mouth 2 (two) times daily.   TRAZODONE HCL 150 MG TB24    Take by mouth at bedtime.   VITAMIN E PO    Take 90 Units by mouth daily.  Modified Medications   Modified Medication Previous Medication   OXYCODONE ER (XTAMPZA ER) 36 MG C12A oxyCODONE ER (XTAMPZA ER) 36 MG C12A      Take 2 capsules (72 mg total) by mouth 2 (two) times daily.    Take 2 capsules (72 mg total) by  mouth 2 (two) times daily.  Discontinued Medications   No medications on file   ----------------------------------------------------------------------------------------------------------------------  Follow-up: Return in about 2 months (around 11/01/2022) for evaluation, med refill.   Procedure: 5 S1 LESI with fluoroscopic guidance and without moderate sedation  NOTE: The risks, benefits, and expectations of the procedure have been discussed and explained to the patient who was understanding and in agreement with suggested treatment plan. No guarantees were made.  DESCRIPTION OF PROCEDURE: Lumbar epidural steroid injection with no IV Versed, EKG, blood pressure,  pulse, and pulse oximetry monitoring. The procedure was performed with the patient in the prone position under fluoroscopic guidance.  Sterile prep x3 was initiated and I then injected subcutaneous lidocaine to the overlying L 5 S1 site after its fluoroscopic identifictation.  Using strict aseptic technique, I then advanced an 18-gauge Tuohy epidural needle in the midline using interlaminar approach via loss-of-resistance to saline technique. There was negative aspiration for heme or  CSF.  I then confirmed position with both AP and Lateral fluoroscan.  2 cc of contrast dye were injected and a  total of 5 mL of Preservative-Free normal saline mixed with 40 mg of Kenalog and 1cc Ropicaine 0.2 percent were injected incrementally via the  epidurally placed needle. The needle was removed. The patient tolerated the injection well and was convalesced and discharged to home in stable condition. Should the patient have any post procedure difficulty they have been instructed on how to contact us for assistance.   Molli Barrows, MD

## 2022-10-12 ENCOUNTER — Ambulatory Visit: Payer: Medicare Other | Admitting: Dermatology

## 2022-10-28 ENCOUNTER — Ambulatory Visit: Payer: Medicare Other | Admitting: Dermatology

## 2022-11-06 ENCOUNTER — Telehealth: Payer: Self-pay | Admitting: Anesthesiology

## 2022-11-06 NOTE — Telephone Encounter (Signed)
Called patient and stated that he needed an appointment for a med refill with Dr Pernell Dupre. [place] to call and schedule an appt.

## 2022-11-06 NOTE — Telephone Encounter (Signed)
PT stated that he is suppose to have a refill on his Xtampza. However patient was told by pharmacy that he didn't have a refill for this medication. Please give patient a call. TY

## 2022-11-09 ENCOUNTER — Ambulatory Visit: Payer: Medicare Other | Attending: Anesthesiology | Admitting: Anesthesiology

## 2022-11-09 ENCOUNTER — Encounter: Payer: Self-pay | Admitting: Anesthesiology

## 2022-11-09 DIAGNOSIS — M4716 Other spondylosis with myelopathy, lumbar region: Secondary | ICD-10-CM | POA: Diagnosis not present

## 2022-11-09 DIAGNOSIS — M5136 Other intervertebral disc degeneration, lumbar region: Secondary | ICD-10-CM | POA: Diagnosis not present

## 2022-11-09 DIAGNOSIS — M47816 Spondylosis without myelopathy or radiculopathy, lumbar region: Secondary | ICD-10-CM

## 2022-11-09 DIAGNOSIS — M5442 Lumbago with sciatica, left side: Secondary | ICD-10-CM | POA: Diagnosis not present

## 2022-11-09 DIAGNOSIS — M5387 Other specified dorsopathies, lumbosacral region: Secondary | ICD-10-CM

## 2022-11-09 DIAGNOSIS — M7918 Myalgia, other site: Secondary | ICD-10-CM

## 2022-11-09 DIAGNOSIS — G8929 Other chronic pain: Secondary | ICD-10-CM

## 2022-11-09 DIAGNOSIS — G894 Chronic pain syndrome: Secondary | ICD-10-CM

## 2022-11-09 MED ORDER — XTAMPZA ER 36 MG PO C12A: 2.0000 | EXTENDED_RELEASE_CAPSULE | Freq: Two times a day (BID) | ORAL | 0 refills | Status: AC

## 2022-11-09 NOTE — Progress Notes (Signed)
Virtual Visit via Telephone Note  I connected with [name] on 11/09/22 at  2:45 PM EDT by telephone and verified that I am speaking with the correct person using two identifiers.  Location: Patient: Home Provider: [facility]   I discussed the limitations, risks, security and privacy concerns of performing an evaluation and management service by telephone and the availability of in person appointments. I also discussed with the patient that there may be a patient responsible charge related to this service. The patient expressed understanding and agreed to proceed.   History of Present Illness:  I was able to speak with Peter Lamb via telephone as we are unable lengthen the video portion conference.  In an epidural about a month and a half ago and did quite well with that.  He reports 70% improvement in his low back pain and nearly complete relief of his sciatica symptoms.  He is starting to feel some mild twinges in the back and occasional recurrence of the sciatic symptoms though not nearly of the intensity as before.  He still taking his Xtampza as prescribed 2 tablets twice a day and this continues to keep him functional active and his pain under control.  He is doing stretching strengthening exercises.  He denies any change in lower extremity strength function bowel or bladder function.  No side effects with the medication are noted.  Review of systems: General: No fevers or chills Pulmonary: No shortness of breath or dyspnea Cardiac: No angina or palpitations or lightheadedness GI: No abdominal pain or constipation Psych: No depression  Observations/Objective:  Current Outpatient Medications:    apixaban (ELIQUIS) 5 MG TABS tablet, TAKE ONE TABLET BY MOUTH TWICE A DAY (CAUTION: BLOOD THINNER) (Patient not taking: Reported on 06/16/2021), Disp: , Rfl:    aspirin 81 MG tablet, Take 162 mg by mouth daily., Disp: , Rfl:    atenolol (TENORMIN) 100 MG tablet, Take 50 mg by  mouth 2 (two) times daily., Disp: , Rfl:    diltiazem (CARDIZEM CD) 240 MG 24 hr capsule, Take 240 mg by mouth daily. (Patient not taking: Reported on 05/26/2021), Disp: , Rfl:    glipiZIDE (GLUCOTROL) 10 MG tablet, Take 10 mg 2 (two) times daily by mouth. (Patient not taking: Reported on 05/26/2021), Disp: , Rfl: 3   glucosamine-chondroitin 500-400 MG tablet, Take 1 tablet by mouth daily., Disp: , Rfl:    lisinopril (ZESTRIL) 10 MG tablet, Take 10 mg by mouth 2 (two) times daily., Disp: , Rfl:    Melatonin 10 MG TABS, Take by mouth at bedtime as needed., Disp: , Rfl:    metFORMIN (GLUCOPHAGE) 500 MG tablet, Take 1,000 mg by mouth 2 (two) times daily with a meal. Reported on 02/10/2016, Disp: , Rfl:    Multiple Vitamins-Minerals (VITAMINS TO GO MEN PO), Take 1 tablet by mouth daily. , Disp: , Rfl:    nitroGLYCERIN (NITROSTAT) 0.4 MG SL tablet, , Disp: , Rfl:    Omega-3 Fatty Acids (FISH OIL) 1000 MG CAPS, Take by mouth daily., Disp: , Rfl:    [START ON 11/10/2022] oxyCODONE ER (XTAMPZA ER) 36 MG C12A, Take 2 capsules (72 mg total) by mouth 2 (two) times daily., Disp: 120 capsule, Rfl: 0   [START ON 12/10/2022] oxyCODONE ER (XTAMPZA ER) 36 MG C12A, Take 2 capsules (72 mg total) by mouth 2 (two) times daily., Disp: 120 capsule, Rfl: 0   rosuvastatin (CRESTOR) 10 MG tablet, Take 10 mg by mouth daily., Disp: , Rfl:  tadalafil (CIALIS) 20 MG tablet, Take 20 mg by mouth daily as needed., Disp: , Rfl:    tiZANidine (ZANAFLEX) 4 MG tablet, Take 1 tablet (4 mg total) by mouth 2 (two) times daily., Disp: 180 tablet, Rfl: 5   TraZODone HCl 150 MG TB24, Take by mouth at bedtime., Disp: , Rfl:    VITAMIN E PO, Take 90 Units by mouth daily., Disp: , Rfl:    Past Medical History:  Diagnosis Date   Arthritis    back, neck   Carpal boss of right wrist    CHF (congestive heart failure)    Depression    Diabetes mellitus without complication    Dysplastic nevus 10/08/2021   superior med buttocks - Moderate    GERD (gastroesophageal reflux disease)    H/O tooth extraction    Hyperlipidemia    Hypertension      Assessment and Plan: 1. DDD (degenerative disc disease), lumbar   2. Lumbar spondylosis with myelopathy   3. 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 Chronic left-sided low back pain with left-sided sciatica   5. Facet arthritis of lumbar region   6. Spondylosis of lumbar region without myelopathy or radiculopathy   7. Chronic pain syndrome   8. Musculoskeletal pain   Based on our conversation it is appropriate to refill his medicines for April 16 and May 16.  Will schedule him for a 63-month return to clinic for an epidural at that time.  This is routine for him and he continues to get good relief with epidural injections.  He generally gets approximately 2 injections/year.  Contingent on his status in June we will decide whether to proceed with that epidural injection at that time.  No other changes in his pharmacologic regimen regimen are initiated at this time.  Continue follow-up with his primary care physician for baseline medical care with return as mentioned.  Follow Up Instructions:    I discussed the assessment and treatment plan with the patient. The patient was provided an opportunity to ask questions and all were answered. The patient agreed with the plan and demonstrated an understanding of the instructions.   The patient was advised to call back or seek an in-person evaluation if the symptoms worsen or if the condition fails to improve as anticipated.  I provided 30 minutes of non-face-to-face time during this encounter.   [name], MD

## 2022-11-09 NOTE — Patient Instructions (Signed)

## 2022-12-22 ENCOUNTER — Telehealth: Payer: Self-pay | Admitting: Anesthesiology

## 2022-12-22 NOTE — Telephone Encounter (Signed)
error 

## 2023-01-09 ENCOUNTER — Emergency Department: Payer: Medicare Other

## 2023-01-09 ENCOUNTER — Emergency Department
Admission: EM | Admit: 2023-01-09 | Discharge: 2023-01-09 | Disposition: A | Discharge status: Home / Self Care | Payer: Medicare Other | Attending: Emergency Medicine | Admitting: Emergency Medicine

## 2023-01-09 ENCOUNTER — Other Ambulatory Visit: Payer: Self-pay

## 2023-01-09 ENCOUNTER — Encounter: Payer: Self-pay | Admitting: Intensive Care

## 2023-01-09 DIAGNOSIS — I509 Heart failure, unspecified: Secondary | ICD-10-CM | POA: Diagnosis not present

## 2023-01-09 DIAGNOSIS — I110 Hypertensive heart disease with heart failure: Secondary | ICD-10-CM | POA: Insufficient documentation

## 2023-01-09 DIAGNOSIS — R079 Chest pain, unspecified: Secondary | ICD-10-CM | POA: Diagnosis not present

## 2023-01-09 DIAGNOSIS — E119 Type 2 diabetes mellitus without complications: Secondary | ICD-10-CM | POA: Insufficient documentation

## 2023-01-09 DIAGNOSIS — I2510 Atherosclerotic heart disease of native coronary artery without angina pectoris: Secondary | ICD-10-CM | POA: Insufficient documentation

## 2023-01-09 LAB — CBC
HCT: 44.7 % (ref 39.0–52.0)
Hemoglobin: 15.1 g/dL (ref 13.0–17.0)
MCH: 30.3 pg (ref 26.0–34.0)
MCHC: 33.8 g/dL (ref 30.0–36.0)
MCV: 89.8 fL (ref 80.0–100.0)
Platelets: 152 10*3/uL (ref 150–400)
RBC: 4.98 MIL/uL (ref 4.22–5.81)
RDW: 12.5 % (ref 11.5–15.5)
WBC: 5.1 10*3/uL (ref 4.0–10.5)
nRBC: 0 % (ref 0.0–0.2)

## 2023-01-09 LAB — BASIC METABOLIC PANEL
Anion gap: 14 (ref 5–15)
BUN: 14 mg/dL (ref 8–23)
CO2: 24 mmol/L (ref 22–32)
Calcium: 9.1 mg/dL (ref 8.9–10.3)
Chloride: 99 mmol/L (ref 98–111)
Creatinine, Ser: 1 mg/dL (ref 0.61–1.24)
GFR, Estimated: >60 mL/min (ref >60)
Glucose, Bld: 164 mg/dL — ABNORMAL HIGH (ref 70–99)
Potassium: 4.1 mmol/L (ref 3.5–5.1)
Sodium: 137 mmol/L (ref 135–145)

## 2023-01-09 LAB — TROPONIN I (HIGH SENSITIVITY): Troponin I (High Sensitivity): 3 ng/L (ref <18)

## 2023-01-09 MED ORDER — XTAMPZA ER 36 MG PO C12A: 72.0000 mg | EXTENDED_RELEASE_CAPSULE | Freq: Two times a day (BID) | ORAL | 0 refills | Status: DC

## 2023-01-09 MED ORDER — OXYCODONE HCL ER 10 MG PO T12A
80.0000 mg | EXTENDED_RELEASE_TABLET | Freq: Once | ORAL | Status: DC
  Filled 2023-01-09: qty 8

## 2023-01-09 NOTE — ED Provider Notes (Signed)
[facility] Provider Note    Event Date/Time   First MD Initiated Contact with Patient 01/09/23 1246     (approximate)   History   Chest Pain   HPI  Peter Lamb is a 67 y.o. male past medical history of diabetes, chronic pain low back pain on Xtampza extended release oxycodone 72 mg twice daily who presents because of chest pressure.  Patient tells me he has been out of the [place] starting today.  He is having worsening of his low back pain as a result and he says that when he has back pain he will get some tension in the left side of his chest and that that is what he is experiencing currently.  This is very similar to episodes he has had in the past when his pain flares up.  It is not exertional and does not radiate.  He has no associated dyspnea diaphoresis or nausea.  He is denying chest pain currently.  This chest pressure started last night.  He also endorses over the last 10 days or so he has had 3 episodes where he gets a shooting tingling down his left arm.  He will feel a sharp pain and then tingling down to his hand.  Last for seconds.  After it happens he feels somewhat fatigued.  Denies any new neck pain.  No focal weakness.  No new numbness or weakness in his legs.  Patient is requesting to have his Xtampza refilled until he can follow-up with his pain doctor.  Per the patient he spoke to the pain clinic and they told him that it was refilled but when he went to the pharmacy they said they did not have it.    Past Medical History:  Diagnosis Date   Arthritis    back, neck   Carpal boss of right wrist    CHF (congestive heart failure) (HCC)    Depression    Diabetes mellitus without complication (HCC)    Dysplastic nevus 10/08/2021   superior med buttocks - Moderate   GERD (gastroesophageal reflux disease)    H/O tooth extraction    Hyperlipidemia    Hypertension     Patient Active Problem List   Diagnosis Date Noted   Sciatica of left  side associated with disorder of lumbosacral spine 01/10/2019   Spondylosis of lumbar region without myelopathy or radiculopathy 01/10/2019   Dupuytren's disease of palm of both hands 01/14/2018   Right carpal tunnel syndrome 01/14/2018   DDD (degenerative disc disease), lumbar 12/16/2016   Chronic pain 12/26/2015   Long term current use of opiate analgesic 12/26/2015   Long term prescription opiate use 12/26/2015   Opiate use 12/26/2015   Encounter for therapeutic drug level monitoring 12/26/2015   Chronic low back pain 12/26/2015   Chronic pain of lower extremity 12/26/2015   Musculoskeletal pain 12/26/2015   Neurogenic pain 12/26/2015   Arteriosclerosis of coronary artery 05/13/2011   Type 2 diabetes mellitus (HCC) 05/13/2011   HLD (hyperlipidemia) 05/13/2011   BP (high blood pressure) 05/13/2011   DM 03/26/2010   GOUT 03/26/2010   DEPRESSION 03/26/2010   CAD 03/26/2010     Physical Exam  Triage Vital Signs: ED Triage Vitals  Enc Vitals Group     BP 01/09/23 1025 (!) 174/92     Pulse Rate 01/09/23 1032 (!) 55     Resp 01/09/23 1025 18     Temp 01/09/23 1025 98 F (36.7 C)  Temp Source 01/09/23 1025 Oral     SpO2 01/09/23 1025 96 %     Weight 01/09/23 1026 210 lb (95.3 kg)     Height 01/09/23 1026 5\' 10"  (1.778 m)     Head Circumference --      Peak Flow --      Pain Score 01/09/23 1033 6     Pain Loc --      Pain Edu? --      Excl. in GC? --     Most recent vital signs: Vitals:   01/09/23 1025 01/09/23 1032  BP: (!) 174/92   Pulse:  (!) 55  Resp: 18   Temp: 98 F (36.7 C)   SpO2: 96%      General: Awake, no distress.  CV:  Good peripheral perfusion.  Resp:  Normal effort.  Lung sounds are clear Abd:  No distention.  Neuro:             Awake, Alert, Oriented x 3  Other:  5 out of 5 strength with grip, elbow extension and flexion bilateral upper extremities, sensation to light touch grossly intact over upper and lower arms 5-5 strength in bilateral  lower extremities with hip flexion Patient is able to ambulate   ED Results / Procedures / Treatments  Labs (all labs ordered are listed, but only abnormal results are displayed) Labs Reviewed  BASIC METABOLIC PANEL - Abnormal; Notable for the following components:      Result Value   Glucose, Bld 164 (*)    All other components within normal limits  CBC  TROPONIN I (HIGH SENSITIVITY)     EKG  I reviewed interpreted patient's EKG which shows sinus bradycardia with normal axis and intervals no acute ischemic changes   RADIOLOGY I reviewed and interpreted the CXR which does not show any acute cardiopulmonary process    PROCEDURES:  Critical Care performed: No  Procedures     MEDICATIONS ORDERED IN ED: Medications  oxyCODONE (OXYCONTIN) 12 hr tablet 80 mg (has no administration in time range)     IMPRESSION / MDM / ASSESSMENT AND PLAN / ED COURSE  I reviewed the triage vital signs and the nursing notes.                              Patient's presentation is most consistent with acute complicated illness / injury requiring diagnostic workup.  Differential diagnosis includes, but is not limited to, exacerbation of chronic pain, opiate withdrawal, cervical radiculopathy, musculoskeletal pain, low suspicion for ACS, aortic dissection, PE  Patient is a 67 year old male who presents with triage complaint of chest pain but it seems the main issues that he is run out of his extended release oxycodone which he takes for chronic back pain.  He takes 72 mg of Xtampza ER twice daily.  He ran out of this last night and has not had it today.  He tells me when he does not take the medication back pain acts up and this then causes strain on his chest which he is experiencing currently.  Says that the chest tightness started last night it is intermittent nonexertional.  Says it feels similar to many episodes he has had before when his back pain acts up.  Denies dyspnea nausea or  diaphoresis.  He has had prior stents no history of MI.  Patient also endorses 3 total episodes of what sounds like a cervical radiculopathy.  He  will get a shooting tingling down his left arm that is painful.  This last for seconds at a time and has just been 3 total episodes and he not experiencing this currently and has a nonfocal neurologic exam.  Patient's EKG shows sinus bradycardia with no ischemic changes.  Troponin is 3 and given atypical nature of symptoms and the fact that it is has been going on since last night I feel that this rules out ACS. Did consider more serious diagnosis such as aortic dissection or PE but feel this is less likely given this feels similar to episodes that he has had with his back pain before.  Patient does have his prescription bottle with him which was filled 5/15.  Did confirm patient's dose is milligrams twice daily.  Plan to give patient equivalent dose of OxyContin in the ER.  Encouraged him to follow-up with pain clinic to see what the issue is with his prescription.  Will write him 2 days worth of medication to bridge until Monday.        FINAL CLINICAL IMPRESSION(S) / ED DIAGNOSES   Final diagnoses:  Chest pain, unspecified type     Rx / DC Orders   ED Discharge Orders     None        Note:  This document was prepared using Dragon voice recognition software and may include unintentional dictation errors.   Georga Hacking, MD 01/09/23 [id number]

## 2023-01-09 NOTE — ED Triage Notes (Signed)
Patient presents with constant, left sided chest pressure this AM.

## 2023-01-11 ENCOUNTER — Encounter: Payer: Self-pay | Admitting: Anesthesiology

## 2023-01-11 ENCOUNTER — Ambulatory Visit
Admission: RE | Admit: 2023-01-11 | Discharge: 2023-01-11 | Disposition: A | Discharge status: Home / Self Care | Payer: Medicare Other | Source: Ambulatory Visit | Attending: Anesthesiology | Admitting: Anesthesiology

## 2023-01-11 ENCOUNTER — Other Ambulatory Visit: Payer: Self-pay | Admitting: Anesthesiology

## 2023-01-11 ENCOUNTER — Ambulatory Visit (HOSPITAL_BASED_OUTPATIENT_CLINIC_OR_DEPARTMENT_OTHER): Payer: Medicare Other | Admitting: Anesthesiology

## 2023-01-11 VITALS — BP 155/79 | HR 68 | Temp 97.9°F | Resp 13 | Ht 70.0 in | Wt 210.0 lb

## 2023-01-11 DIAGNOSIS — G8929 Other chronic pain: Secondary | ICD-10-CM | POA: Insufficient documentation

## 2023-01-11 DIAGNOSIS — Z79891 Long term (current) use of opiate analgesic: Secondary | ICD-10-CM

## 2023-01-11 DIAGNOSIS — M47816 Spondylosis without myelopathy or radiculopathy, lumbar region: Secondary | ICD-10-CM

## 2023-01-11 DIAGNOSIS — M4716 Other spondylosis with myelopathy, lumbar region: Secondary | ICD-10-CM | POA: Diagnosis present

## 2023-01-11 DIAGNOSIS — G894 Chronic pain syndrome: Secondary | ICD-10-CM

## 2023-01-11 DIAGNOSIS — M5136 Other intervertebral disc degeneration, lumbar region: Secondary | ICD-10-CM

## 2023-01-11 DIAGNOSIS — M5387 Other specified dorsopathies, lumbosacral region: Secondary | ICD-10-CM

## 2023-01-11 DIAGNOSIS — M7918 Myalgia, other site: Secondary | ICD-10-CM

## 2023-01-11 DIAGNOSIS — M5442 Lumbago with sciatica, left side: Secondary | ICD-10-CM | POA: Insufficient documentation

## 2023-01-11 MED ORDER — XTAMPZA ER 36 MG PO C12A: 2.0000 | EXTENDED_RELEASE_CAPSULE | Freq: Two times a day (BID) | ORAL | 0 refills | Status: DC

## 2023-01-11 MED ORDER — XTAMPZA ER 36 MG PO C12A: 72.0000 mg | EXTENDED_RELEASE_CAPSULE | Freq: Two times a day (BID) | ORAL | 0 refills | Status: AC

## 2023-01-11 MED ORDER — LIDOCAINE HCL (PF) 1 % IJ SOLN
5.0000 mL | Freq: Once | INTRAMUSCULAR | Status: AC
  Administered 2023-01-11: 5 mL via SUBCUTANEOUS

## 2023-01-11 MED ORDER — ROPIVACAINE HCL 2 MG/ML IJ SOLN
10.0000 mL | Freq: Once | INTRAMUSCULAR | Status: AC
  Administered 2023-01-11: 2 mL via EPIDURAL

## 2023-01-11 MED ORDER — TRIAMCINOLONE ACETONIDE 40 MG/ML IJ SUSP
40.0000 mg | Freq: Once | INTRAMUSCULAR | Status: AC
  Administered 2023-01-11: 40 mg

## 2023-01-11 MED ORDER — TRIAMCINOLONE ACETONIDE 40 MG/ML IJ SUSP
INTRAMUSCULAR | Status: AC
  Filled 2023-01-11: qty 1

## 2023-01-11 MED ORDER — LIDOCAINE HCL (PF) 1 % IJ SOLN
INTRAMUSCULAR | Status: AC
  Filled 2023-01-11: qty 10

## 2023-01-11 MED ORDER — IOHEXOL 180 MG/ML  SOLN
10.0000 mL | Freq: Once | INTRAMUSCULAR | Status: AC | PRN
  Administered 2023-01-11: 10 mL via EPIDURAL

## 2023-01-11 MED ORDER — SODIUM CHLORIDE (PF) 0.9 % IJ SOLN
INTRAMUSCULAR | Status: AC
  Filled 2023-01-11: qty 10

## 2023-01-11 MED ORDER — ROPIVACAINE HCL 2 MG/ML IJ SOLN
INTRAMUSCULAR | Status: AC
  Filled 2023-01-11: qty 20

## 2023-01-11 MED ORDER — SODIUM CHLORIDE 0.9% FLUSH
10.0000 mL | Freq: Once | INTRAVENOUS | Status: AC
  Administered 2023-01-11: 10 mL

## 2023-01-11 MED ORDER — IOHEXOL 180 MG/ML  SOLN
INTRAMUSCULAR | Status: AC
  Filled 2023-01-11: qty 20

## 2023-01-11 NOTE — Progress Notes (Unsigned)
Nursing Pain Medication Assessment:  Safety precautions to be maintained throughout the outpatient stay will include: orient to surroundings, keep bed in low position, maintain call bell within reach at all times, provide assistance with transfer out of bed and ambulation.  Medication Inspection Compliance: Pill count conducted under aseptic conditions, in front of the patient. Neither the pills nor the bottle was removed from the patient's sight at any time. Once count was completed pills were immediately returned to the patient in their original bottle.  Medication: xtampsza Pill/Patch Count:  0 of 122 pills remain Pill/Patch Appearance: Markings consistent with prescribed medication Bottle Appearance: Standard pharmacy container. Clearly labeled. Filled Date: 05 / 16 / 2024 Last Medication intake:  Today

## 2023-01-12 ENCOUNTER — Telehealth: Payer: Self-pay

## 2023-01-12 NOTE — Progress Notes (Signed)
Subjective:  Patient ID: Peter Lamb, male    DOB: [birth year]  Age: 67 y.o. MRN: [id number]  CC: Back Pain (Lower left)   Procedure: L5-S1 epidural steroid and fluoroscopic guidance with no sedation  HPI Peter Lamb presents for reevaluation.  Peter Lamb continues to get intermittent left sciatica pain unresponsive to conservative therapy with medication or physical therapy activity.  He gets occasional epidural steroid injections for this and is very responsive to them.  His last epidural injection was back in February and he had nearly 100% relief of his left leg sciatica for about 2 months before a gradual slow recurrence of a similar pain and nearly 70% relief of his low back pain.  He still takes his Xtampza as indicated and this continues to help with generalized osteoarthritis pain.  It enables him to function and stay active and sleep better at night.  No change in lower extremity strength function bowel or bladder function is noted but his sciatica has intensified to the point where he is requesting a repeat epidural today.  His last injection was back in February.  Outpatient Medications Prior to Visit  Medication Sig Dispense Refill   aspirin 81 MG tablet Take 162 mg by mouth daily.     atenolol (TENORMIN) 100 MG tablet Take 50 mg by mouth 2 (two) times daily.     glucosamine-chondroitin 500-400 MG tablet Take 1 tablet by mouth daily.     lisinopril (ZESTRIL) 10 MG tablet Take 10 mg by mouth 2 (two) times daily.     Melatonin 10 MG TABS Take by mouth at bedtime as needed.     metFORMIN (GLUCOPHAGE) 500 MG tablet Take 1,000 mg by mouth 2 (two) times daily with a meal. Reported on 02/10/2016     Multiple Vitamins-Minerals (VITAMINS TO GO MEN PO) Take 1 tablet by mouth daily.      nitroGLYCERIN (NITROSTAT) 0.4 MG SL tablet      Omega-3 Fatty Acids (FISH OIL) 1000 MG CAPS Take by mouth daily.     rosuvastatin (CRESTOR) 10 MG tablet Take 10 mg by mouth daily.     tadalafil (CIALIS)  20 MG tablet Take 20 mg by mouth daily as needed.     tiZANidine (ZANAFLEX) 4 MG tablet Take 1 tablet (4 mg total) by mouth 2 (two) times daily. 180 tablet 5   TraZODone HCl 150 MG TB24 Take by mouth at bedtime.     VITAMIN E PO Take 90 Units by mouth daily.     oxyCODONE ER (XTAMPZA ER) 36 MG C12A Take 2 capsules (72 mg total) by mouth 2 (two) times daily for 2 days. 8 capsule 0   apixaban (ELIQUIS) 5 MG TABS tablet TAKE ONE TABLET BY MOUTH TWICE A DAY (CAUTION: BLOOD THINNER) (Patient not taking: Reported on 06/16/2021)     diltiazem (CARDIZEM CD) 240 MG 24 hr capsule Take 240 mg by mouth daily. (Patient not taking: Reported on 05/26/2021)     glipiZIDE (GLUCOTROL) 10 MG tablet Take 10 mg 2 (two) times daily by mouth. (Patient not taking: Reported on 05/26/2021)  3   No facility-administered medications prior to visit.    Review of Systems CNS: No confusion or sedation Cardiac: No angina or palpitations GI: No abdominal pain or constipation Constitutional: No nausea vomiting fevers or chills  Objective:  BP (!) 155/79   Pulse 68   Temp 97.9 F (36.6 C)   Resp 13   Ht 5\' 10"  (1.778 m)  Wt 210 lb (95.3 kg)   SpO2 98%   BMI 30.13 kg/m    BP Readings from Last 3 Encounters:  01/11/23 (!) 155/79  01/09/23 (!) 174/92  09/02/22 118/85     Wt Readings from Last 3 Encounters:  01/11/23 210 lb (95.3 kg)  01/09/23 210 lb (95.3 kg)  09/02/22 210 lb (95.3 kg)     Physical Exam Pt is alert and oriented PERRL EOMI HEART IS RRR no murmur or rub LCTA no wheezing or rales MUSCULOSKELETAL reveals some paraspinous muscle tenderness but no overt trigger points.  He does have a positive straight leg raise on the left side negative on the right muscle tone and bulk is at baseline and he is walking with an antalgic gait slightly worse than at baseline.  Labs  Lab Results  Component Value Date   HGBA1C 9.0 03/04/2010   Lab Results  Component Value Date   CREATININE 1.00  01/09/2023    -------------------------------------------------------------------------------------------------------------------- Lab Results  Component Value Date   WBC 5.1 01/09/2023   HGB 15.1 01/09/2023   HCT 44.7 01/09/2023   PLT 152 01/09/2023   GLUCOSE 164 (H) 01/09/2023   ALT 31 03/04/2010   AST 23 03/04/2010   NA 137 01/09/2023   K 4.1 01/09/2023   CL 99 01/09/2023   CREATININE 1.00 01/09/2023   BUN 14 01/09/2023   CO2 24 01/09/2023   PSA 0.26 03/04/2010   HGBA1C 9.0 03/04/2010    --------------------------------------------------------------------------------------------------------------------- DG PAIN CLINIC C-ARM 1-60 MIN NO REPORT  Result Date: 01/11/2023 Fluoro was used, but no Radiologist interpretation will be provided. Please refer to "NOTES" tab for provider progress note.    Assessment & Plan:   Peter Lamb was seen today for back pain.  Diagnoses and all orders for this visit:  Facet arthritis of lumbar region  DDD (degenerative disc disease), lumbar -     Lumbar Epidural Injection -     triamcinolone acetonide (KENALOG-40) injection 40 mg -     sodium chloride flush (NS) 0.9 % injection 10 mL -     ropivacaine (PF) 2 mg/mL (0.2%) (NAROPIN) injection 10 mL -     lidocaine (PF) (XYLOCAINE) 1 % injection 5 mL -     iohexol (OMNIPAQUE) 180 MG/ML injection 10 mL  Lumbar spondylosis with myelopathy -     Lumbar Epidural Injection  Sciatica of left side associated with disorder of lumbosacral spine -     Lumbar Epidural Injection -     triamcinolone acetonide (KENALOG-40) injection 40 mg -     sodium chloride flush (NS) 0.9 % injection 10 mL -     ropivacaine (PF) 2 mg/mL (0.2%) (NAROPIN) injection 10 mL -     lidocaine (PF) (XYLOCAINE) 1 % injection 5 mL -     iohexol (OMNIPAQUE) 180 MG/ML injection 10 mL  Chronic left-sided low back pain with left-sided sciatica -     Lumbar Epidural Injection  Spondylosis of lumbar region without myelopathy or  radiculopathy  Chronic pain syndrome  Musculoskeletal pain  Long term current use of opiate analgesic  Other orders -     oxyCODONE ER (XTAMPZA ER) 36 MG C12A; Take 2 capsules (72 mg total) by mouth 2 (two) times daily for 2 days. -     oxyCODONE ER (XTAMPZA ER) 36 MG C12A; Take 2 capsules (72 mg total) by mouth 2 (two) times daily.        ----------------------------------------------------------------------------------------------------------------------  Problem List Items Addressed This Visit  Unprioritized   Chronic low back pain (Chronic)   Relevant Medications   oxyCODONE ER (XTAMPZA ER) 36 MG C12A   oxyCODONE ER (XTAMPZA ER) 36 MG C12A (Start on 02/10/2023)   Chronic pain (Chronic)   Relevant Medications   oxyCODONE ER (XTAMPZA ER) 36 MG C12A   oxyCODONE ER (XTAMPZA ER) 36 MG C12A (Start on 02/10/2023)   Long term current use of opiate analgesic (Chronic)   Musculoskeletal pain (Chronic)   DDD (degenerative disc disease), lumbar   Relevant Medications   oxyCODONE ER (XTAMPZA ER) 36 MG C12A   oxyCODONE ER (XTAMPZA ER) 36 MG C12A (Start on 02/10/2023)   Sciatica of left side associated with disorder of lumbosacral spine   Spondylosis of lumbar region without myelopathy or radiculopathy   Relevant Medications   oxyCODONE ER (XTAMPZA ER) 36 MG C12A   oxyCODONE ER (XTAMPZA ER) 36 MG C12A (Start on 02/10/2023)   Other Visit Diagnoses     Facet arthritis of lumbar region    -  Primary   Relevant Medications   oxyCODONE ER (XTAMPZA ER) 36 MG C12A   oxyCODONE ER (XTAMPZA ER) 36 MG C12A (Start on 02/10/2023)   triamcinolone acetonide (KENALOG-40) injection 40 mg (Completed)   Lumbar spondylosis with myelopathy       Relevant Medications   oxyCODONE ER (XTAMPZA ER) 36 MG C12A   oxyCODONE ER (XTAMPZA ER) 36 MG C12A (Start on 02/10/2023)   triamcinolone acetonide (KENALOG-40) injection 40 mg (Completed)          ----------------------------------------------------------------------------------------------------------------------  1. DDD (degenerative disc disease), lumbar We will proceed with a repeat epidural injection today.  We gone over the risks and benefits of the procedure.  He has responded very favorably to these in the past.  Will schedule him for a 96-month return to clinic. - Lumbar Epidural Injection - triamcinolone acetonide (KENALOG-40) injection 40 mg - sodium chloride flush (NS) 0.9 % injection 10 mL - ropivacaine (PF) 2 mg/mL (0.2%) (NAROPIN) injection 10 mL - lidocaine (PF) (XYLOCAINE) 1 % injection 5 mL - iohexol (OMNIPAQUE) 180 MG/ML injection 10 mL  2. Lumbar spondylosis with myelopathy As above continue core stretching strengthening exercises. - Lumbar Epidural Injection  3. Sciatica of left side associated with disorder of lumbosacral spine As above - Lumbar Epidural Injection - triamcinolone acetonide (KENALOG-40) injection 40 mg - sodium chloride flush (NS) 0.9 % injection 10 mL - ropivacaine (PF) 2 mg/mL (0.2%) (NAROPIN) injection 10 mL - lidocaine (PF) (XYLOCAINE) 1 % injection 5 mL - iohexol (OMNIPAQUE) 180 MG/ML injection 10 mL  4. Chronic left-sided low back pain with left-sided sciatica  - Lumbar Epidural Injection  5. Facet arthritis of lumbar region   6. Spondylosis of lumbar region without myelopathy or radiculopathy   7. Chronic pain syndrome I have reviewed the [facility] practitioner database information and its appropriate for refill today.  He is overdue and refills will be generated for June 10 February 2016 with return to clinic in 2 months.  8. Musculoskeletal pain   9. Long term current use of opiate analgesic     ----------------------------------------------------------------------------------------------------------------------  I am having Peter Lamb start on [place] ER. I am also having him maintain his aspirin,  metFORMIN, atenolol, diltiazem, Multiple Vitamins-Minerals (VITAMINS TO GO MEN PO), rosuvastatin, TraZODone HCl, glipiZIDE, nitroGLYCERIN, Fish Oil, VITAMIN E PO, Melatonin, glucosamine-chondroitin, tadalafil, apixaban, lisinopril, tiZANidine, and Xtampza ER. We administered triamcinolone acetonide, sodium chloride flush, ropivacaine (PF) 2 mg/mL (0.2%), lidocaine (PF), and iohexol.  Meds ordered this encounter  Medications   oxyCODONE ER (XTAMPZA ER) 36 MG C12A    Sig: Take 2 capsules (72 mg total) by mouth 2 (two) times daily for 2 days.    Dispense:  120 capsule    Refill:  0   oxyCODONE ER (XTAMPZA ER) 36 MG C12A    Sig: Take 2 capsules (72 mg total) by mouth 2 (two) times daily.    Dispense:  120 capsule    Refill:  0   triamcinolone acetonide (KENALOG-40) injection 40 mg   sodium chloride flush (NS) 0.9 % injection 10 mL   ropivacaine (PF) 2 mg/mL (0.2%) (NAROPIN) injection 10 mL   lidocaine (PF) (XYLOCAINE) 1 % injection 5 mL   iohexol (OMNIPAQUE) 180 MG/ML injection 10 mL   Patient's Medications  New Prescriptions   OXYCODONE ER (XTAMPZA ER) 36 MG C12A    Take 2 capsules (72 mg total) by mouth 2 (two) times daily.  Previous Medications   APIXABAN (ELIQUIS) 5 MG TABS TABLET    TAKE ONE TABLET BY MOUTH TWICE A DAY (CAUTION: BLOOD THINNER)   ASPIRIN 81 MG TABLET    Take 162 mg by mouth daily.   ATENOLOL (TENORMIN) 100 MG TABLET    Take 50 mg by mouth 2 (two) times daily.   DILTIAZEM (CARDIZEM CD) 240 MG 24 HR CAPSULE    Take 240 mg by mouth daily.   GLIPIZIDE (GLUCOTROL) 10 MG TABLET    Take 10 mg 2 (two) times daily by mouth.   GLUCOSAMINE-CHONDROITIN 500-400 MG TABLET    Take 1 tablet by mouth daily.   LISINOPRIL (ZESTRIL) 10 MG TABLET    Take 10 mg by mouth 2 (two) times daily.   MELATONIN 10 MG TABS    Take by mouth at bedtime as needed.   METFORMIN (GLUCOPHAGE) 500 MG TABLET    Take 1,000 mg by mouth 2 (two) times daily with a meal. Reported on 02/10/2016   MULTIPLE  VITAMINS-MINERALS (VITAMINS TO GO MEN PO)    Take 1 tablet by mouth daily.    NITROGLYCERIN (NITROSTAT) 0.4 MG SL TABLET       OMEGA-3 FATTY ACIDS (FISH OIL) 1000 MG CAPS    Take by mouth daily.   ROSUVASTATIN (CRESTOR) 10 MG TABLET    Take 10 mg by mouth daily.   TADALAFIL (CIALIS) 20 MG TABLET    Take 20 mg by mouth daily as needed.   TIZANIDINE (ZANAFLEX) 4 MG TABLET    Take 1 tablet (4 mg total) by mouth 2 (two) times daily.   TRAZODONE HCL 150 MG TB24    Take by mouth at bedtime.   VITAMIN E PO    Take 90 Units by mouth daily.  Modified Medications   Modified Medication Previous Medication   OXYCODONE ER (XTAMPZA ER) 36 MG C12A oxyCODONE ER (XTAMPZA ER) 36 MG C12A      Take 2 capsules (72 mg total) by mouth 2 (two) times daily for 2 days.    Take 2 capsules (72 mg total) by mouth 2 (two) times daily for 2 days.  Discontinued Medications   No medications on file   ----------------------------------------------------------------------------------------------------------------------  Follow-up: Return in about 1 month (around 02/10/2023) for evaluation, med refill.   Procedure: L5-S1 LESI with fluoroscopic guidance and without moderate sedation  NOTE: The risks, benefits, and expectations of the procedure have been discussed and explained to the patient who was understanding and in agreement with suggested treatment plan. No  guarantees were made.  DESCRIPTION OF PROCEDURE: Lumbar epidural steroid injection with no IV Versed, EKG, blood pressure, pulse, and pulse oximetry monitoring. The procedure was performed with the patient in the prone position under fluoroscopic guidance.  Sterile prep x3 was initiated and I then injected subcutaneous lidocaine to the overlying L5-S1 site after its fluoroscopic identifictation.  Using strict aseptic technique, I then advanced an 18-gauge Tuohy epidural needle in the midline using interlaminar approach via loss-of-resistance to saline technique. There was  negative aspiration for heme or  CSF.  I then confirmed position with both AP and Lateral fluoroscan.  2 cc of contrast dye were injected and a  total of 5 mL of Preservative-Free normal saline mixed with 40 mg of Kenalog and 1cc Ropicaine 0.2 percent were injected incrementally via the  epidurally placed needle. The needle was removed. The patient tolerated the injection well and was convalesced and discharged to home in stable condition. Should the patient have any post procedure difficulty they have been instructed on how to contact us for assistance.   [name], MD

## 2023-01-12 NOTE — Telephone Encounter (Signed)
Post procedure follow up.  LM 

## 2023-02-24 ENCOUNTER — Ambulatory Visit: Payer: Medicare Other | Admitting: Dermatology

## 2023-03-10 ENCOUNTER — Ambulatory Visit: Payer: Medicare Other | Attending: Anesthesiology | Admitting: Anesthesiology

## 2023-03-10 ENCOUNTER — Encounter: Payer: Self-pay | Admitting: Anesthesiology

## 2023-03-10 DIAGNOSIS — M5136 Other intervertebral disc degeneration, lumbar region: Secondary | ICD-10-CM

## 2023-03-10 DIAGNOSIS — Z79891 Long term (current) use of opiate analgesic: Secondary | ICD-10-CM

## 2023-03-10 DIAGNOSIS — G894 Chronic pain syndrome: Secondary | ICD-10-CM

## 2023-03-10 DIAGNOSIS — M47816 Spondylosis without myelopathy or radiculopathy, lumbar region: Secondary | ICD-10-CM | POA: Diagnosis not present

## 2023-03-10 DIAGNOSIS — M4716 Other spondylosis with myelopathy, lumbar region: Secondary | ICD-10-CM

## 2023-03-10 DIAGNOSIS — M7918 Myalgia, other site: Secondary | ICD-10-CM | POA: Diagnosis not present

## 2023-03-10 MED ORDER — XTAMPZA ER 36 MG PO C12A: 2.0000 | EXTENDED_RELEASE_CAPSULE | Freq: Two times a day (BID) | ORAL | 0 refills | Status: AC

## 2023-03-10 MED ORDER — XTAMPZA ER 36 MG PO C12A: 2.0000 | EXTENDED_RELEASE_CAPSULE | Freq: Two times a day (BID) | ORAL | 0 refills | Status: DC

## 2023-03-11 NOTE — Progress Notes (Signed)
Virtual Visit via Telephone Note  I connected with Peter Lamb on 03/11/23 at 12:00 PM EDT by telephone and verified that I am speaking with the correct person using two identifiers.  Location: Patient: Home Provider: Pain control center   I discussed the limitations, risks, security and privacy concerns of performing an evaluation and management service by telephone and the availability of in person appointments. I also discussed with the patient that there may be a patient responsible charge related to this service. The patient expressed understanding and agreed to proceed.   History of Present Illness: I spoke with Peter Lamb via telephone as he was unable to do the video portion of the conference.  He reports that his low back pain and intermittent neck pain in addition to the leg pain is stable in nature.  He recently had an epidural which eliminated the vast majority of his lower extremity pain and sciatica symptoms.  His low back pain is still bothering him but is 50% better from the injection.  He does use his opioid medications to assist with this and these continue to work well for him.  The quality characteristic and distribution of the pain he experiences in the low back and occasionally in the neck are stable in nature without recent change.  No change in lower extremity strength function bowel or bladder function is noted at this time.  He continues to do his physical therapy exercises and the medications enable him to fulfill these requirements and he stays active.  He is walking.  Sleeps better at night with his medications with no side effects reported with the medication.  No change in bowel or bladder function is noted.  Review of systems: General: No fevers or chills Pulmonary: No shortness of breath or dyspnea Cardiac: No angina or palpitations or lightheadedness GI: No abdominal pain or constipation Psych: No depression    Observations/Objective:  Current  Outpatient Medications:    [START ON 04/11/2023] oxyCODONE ER (XTAMPZA ER) 36 MG C12A, Take 2 capsules (72 mg total) by mouth 2 (two) times daily., Disp: 120 capsule, Rfl: 0   apixaban (ELIQUIS) 5 MG TABS tablet, TAKE ONE TABLET BY MOUTH TWICE A DAY (CAUTION: BLOOD THINNER) (Patient not taking: Reported on 06/16/2021), Disp: , Rfl:    aspirin 81 MG tablet, Take 162 mg by mouth daily., Disp: , Rfl:    atenolol (TENORMIN) 100 MG tablet, Take 50 mg by mouth 2 (two) times daily., Disp: , Rfl:    diltiazem (CARDIZEM CD) 240 MG 24 hr capsule, Take 240 mg by mouth daily. (Patient not taking: Reported on 05/26/2021), Disp: , Rfl:    glipiZIDE (GLUCOTROL) 10 MG tablet, Take 10 mg 2 (two) times daily by mouth. (Patient not taking: Reported on 05/26/2021), Disp: , Rfl: 3   glucosamine-chondroitin 500-400 MG tablet, Take 1 tablet by mouth daily., Disp: , Rfl:    lisinopril (ZESTRIL) 10 MG tablet, Take 10 mg by mouth 2 (two) times daily., Disp: , Rfl:    Melatonin 10 MG TABS, Take by mouth at bedtime as needed., Disp: , Rfl:    metFORMIN (GLUCOPHAGE) 500 MG tablet, Take 1,000 mg by mouth 2 (two) times daily with a meal. Reported on 02/10/2016, Disp: , Rfl:    Multiple Vitamins-Minerals (VITAMINS TO GO MEN PO), Take 1 tablet by mouth daily. , Disp: , Rfl:    nitroGLYCERIN (NITROSTAT) 0.4 MG SL tablet, , Disp: , Rfl:    Omega-3 Fatty Acids (FISH OIL) 1000 MG  CAPS, Take by mouth daily., Disp: , Rfl:    [START ON 03/12/2023] oxyCODONE ER (XTAMPZA ER) 36 MG C12A, Take 2 capsules (72 mg total) by mouth 2 (two) times daily., Disp: 120 capsule, Rfl: 0   rosuvastatin (CRESTOR) 10 MG tablet, Take 10 mg by mouth daily., Disp: , Rfl:    tadalafil (CIALIS) 20 MG tablet, Take 20 mg by mouth daily as needed., Disp: , Rfl:    tiZANidine (ZANAFLEX) 4 MG tablet, Take 1 tablet (4 mg total) by mouth 2 (two) times daily., Disp: 180 tablet, Rfl: 5   TraZODone HCl 150 MG TB24, Take by mouth at bedtime., Disp: , Rfl:    VITAMIN E PO,  Take 90 Units by mouth daily., Disp: , Rfl:   Past Medical History:  Diagnosis Date   Arthritis    back, neck   Carpal boss of right wrist    CHF (congestive heart failure) (HCC)    Depression    Diabetes mellitus without complication (HCC)    Dysplastic nevus 10/08/2021   superior med buttocks - Moderate   GERD (gastroesophageal reflux disease)    H/O tooth extraction    Hyperlipidemia    Hypertension     Assessment and Plan: 1. DDD (degenerative disc disease), lumbar   2. Lumbar spondylosis with myelopathy   3. Facet arthritis of lumbar region   4. Musculoskeletal pain   5. Chronic pain syndrome   6. Long term current use of opiate analgesic    Based on our conversation and after review of the [place] practitioner database information epic is appropriate to refill his medicines for the next 2 months dated 28 February 2015 and September 15.  No other changes in his regimen will be initiated.  Continue stretching strengthening exercises as reviewed and we will defer on any repeat injections till likely early in 2025 as he averages about 2 injections/year with the epidurals.  Continue follow-up with his primary care physicians for baseline medical care with return to clinic scheduled in 2 months.  Follow Up Instructions:    I discussed the assessment and treatment plan with the patient. The patient was provided an opportunity to ask questions and all were answered. The patient agreed with the plan and demonstrated an understanding of the instructions.   The patient was advised to call back or seek an in-person evaluation if the symptoms worsen or if the condition fails to improve as anticipated.  I provided 30 minutes of non-face-to-face time during this encounter.   [name], MD

## 2023-05-03 ENCOUNTER — Ambulatory Visit: Payer: Medicare Other | Attending: Anesthesiology | Admitting: Anesthesiology

## 2023-05-03 DIAGNOSIS — M7918 Myalgia, other site: Secondary | ICD-10-CM

## 2023-05-03 DIAGNOSIS — M5442 Lumbago with sciatica, left side: Secondary | ICD-10-CM

## 2023-05-03 DIAGNOSIS — M5136 Other intervertebral disc degeneration, lumbar region with discogenic back pain only: Secondary | ICD-10-CM | POA: Diagnosis not present

## 2023-05-03 DIAGNOSIS — M5387 Other specified dorsopathies, lumbosacral region: Secondary | ICD-10-CM

## 2023-05-03 DIAGNOSIS — D692 Other nonthrombocytopenic purpura: Secondary | ICD-10-CM | POA: Insufficient documentation

## 2023-05-03 DIAGNOSIS — M4716 Other spondylosis with myelopathy, lumbar region: Secondary | ICD-10-CM | POA: Diagnosis not present

## 2023-05-03 DIAGNOSIS — M47816 Spondylosis without myelopathy or radiculopathy, lumbar region: Secondary | ICD-10-CM

## 2023-05-03 DIAGNOSIS — G8929 Other chronic pain: Secondary | ICD-10-CM

## 2023-05-03 DIAGNOSIS — G894 Chronic pain syndrome: Secondary | ICD-10-CM

## 2023-05-03 MED ORDER — XTAMPZA ER 36 MG PO C12A: 2.0000 | EXTENDED_RELEASE_CAPSULE | Freq: Two times a day (BID) | ORAL | 0 refills | Status: DC

## 2023-05-03 MED ORDER — XTAMPZA ER 36 MG PO C12A: 2.0000 | EXTENDED_RELEASE_CAPSULE | Freq: Two times a day (BID) | ORAL | 0 refills | Status: AC

## 2023-05-03 NOTE — Progress Notes (Signed)
Virtual Visit via Telephone Note  I connected with Peter Lamb on 05/03/23 at  1:00 PM EDT by telephone and verified that I am speaking with the correct person using two identifiers.  Location: Patient: Home Provider: Pain control center   I discussed the limitations, risks, security and privacy concerns of performing an evaluation and management service by telephone and the availability of in person appointments. I also discussed with the patient that there may be a patient responsible charge related to this service. The patient expressed understanding and agreed to proceed.   History of Present Illness: I spoke with Peter Lamb via telephone as we were unable link for the video portion of the conference.  He reports that he is doing well in regards to his low back pain.  He had a recent epidural back in June of this year and the sciatica symptoms have continued to respond favorably.  He staying active and continues to have low back pain but this is better controlled with his current Xtampza medication.  He is taking this 2 tablets 2 times a day with no side effects and this continues to work well for him.  Otherwise he is in his usual state of health.  No change in lower extremity strength function bowel or bladder function is noted.  Review of systems: General: No fevers or chills Pulmonary: No shortness of breath or dyspnea Cardiac: No angina or palpitations or lightheadedness GI: No abdominal pain or constipation Psych: No depression    Observations/Objective:  Current Outpatient Medications:    [START ON 06/10/2023] oxyCODONE ER (XTAMPZA ER) 36 MG C12A, Take 2 capsules (72 mg total) by mouth 2 (two) times daily., Disp: 120 capsule, Rfl: 0   apixaban (ELIQUIS) 5 MG TABS tablet, TAKE ONE TABLET BY MOUTH TWICE A DAY (CAUTION: BLOOD THINNER) (Patient not taking: Reported on 06/16/2021), Disp: , Rfl:    aspirin 81 MG tablet, Take 162 mg by mouth daily., Disp: , Rfl:    atenolol  (TENORMIN) 100 MG tablet, Take 50 mg by mouth 2 (two) times daily., Disp: , Rfl:    diltiazem (CARDIZEM CD) 240 MG 24 hr capsule, Take 240 mg by mouth daily. (Patient not taking: Reported on 05/26/2021), Disp: , Rfl:    glipiZIDE (GLUCOTROL) 10 MG tablet, Take 10 mg 2 (two) times daily by mouth. (Patient not taking: Reported on 05/26/2021), Disp: , Rfl: 3   glucosamine-chondroitin 500-400 MG tablet, Take 1 tablet by mouth daily., Disp: , Rfl:    lisinopril (ZESTRIL) 10 MG tablet, Take 10 mg by mouth 2 (two) times daily., Disp: , Rfl:    Melatonin 10 MG TABS, Take by mouth at bedtime as needed., Disp: , Rfl:    metFORMIN (GLUCOPHAGE) 500 MG tablet, Take 1,000 mg by mouth 2 (two) times daily with a meal. Reported on 02/10/2016, Disp: , Rfl:    Multiple Vitamins-Minerals (VITAMINS TO GO MEN PO), Take 1 tablet by mouth daily. , Disp: , Rfl:    nitroGLYCERIN (NITROSTAT) 0.4 MG SL tablet, , Disp: , Rfl:    Omega-3 Fatty Acids (FISH OIL) 1000 MG CAPS, Take by mouth daily., Disp: , Rfl:    [START ON 05/11/2023] oxyCODONE ER (XTAMPZA ER) 36 MG C12A, Take 2 capsules (72 mg total) by mouth 2 (two) times daily., Disp: 120 capsule, Rfl: 0   rosuvastatin (CRESTOR) 10 MG tablet, Take 10 mg by mouth daily., Disp: , Rfl:    tadalafil (CIALIS) 20 MG tablet, Take 20 mg by mouth  daily as needed., Disp: , Rfl:    tiZANidine (ZANAFLEX) 4 MG tablet, Take 1 tablet (4 mg total) by mouth 2 (two) times daily., Disp: 180 tablet, Rfl: 5   TraZODone HCl 150 MG TB24, Take by mouth at bedtime., Disp: , Rfl:    VITAMIN E PO, Take 90 Units by mouth daily., Disp: , Rfl:    Past Medical History:  Diagnosis Date   Arthritis    back, neck   Carpal boss of right wrist    CHF (congestive heart failure) (HCC)    Depression    Diabetes mellitus without complication (HCC)    Dysplastic nevus 10/08/2021   superior med buttocks - Moderate   GERD (gastroesophageal reflux disease)    H/O tooth extraction    Hyperlipidemia     Hypertension      Assessment and Plan: 1. Degeneration of intervertebral disc of lumbar region with discogenic back pain   2. Lumbar spondylosis with myelopathy   3. Facet arthritis of lumbar region   4. Musculoskeletal pain   5. Chronic pain syndrome   6. Sciatica of left side associated with disorder of lumbosacral spine   7. Chronic left-sided low back pain with left-sided sciatica   8. Spondylosis of lumbar region without myelopathy or radiculopathy   9. Other nonthrombocytopenic purpura (HCC)   Based on our conversation it is appropriate to refill his medicines for the next 2 months dated for October 15 and November 14.  I have reviewed the [facility] practitioner database information and it is appropriate for refill.  No change in his pharmacologic regimen will be initiated.  Encouraged him to continue with stretching strengthening exercises as before.  Continue follow-up with his primary care physician for routine monitoring.  I have reviewed his urine screen and it is appropriate as well.  He is scheduled for return to clinic in 2 months.  Follow Up Instructions:    I discussed the assessment and treatment plan with the patient. The patient was provided an opportunity to ask questions and all were answered. The patient agreed with the plan and demonstrated an understanding of the instructions.   The patient was advised to call back or seek an in-person evaluation if the symptoms worsen or if the condition fails to improve as anticipated.  I provided 30 minutes of non-face-to-face time during this encounter.   [name], MD

## 2023-07-07 ENCOUNTER — Ambulatory Visit: Payer: Medicare Other | Attending: Anesthesiology | Admitting: Anesthesiology

## 2023-07-07 ENCOUNTER — Encounter: Payer: Self-pay | Admitting: Anesthesiology

## 2023-07-07 DIAGNOSIS — M4716 Other spondylosis with myelopathy, lumbar region: Secondary | ICD-10-CM | POA: Diagnosis not present

## 2023-07-07 DIAGNOSIS — M7918 Myalgia, other site: Secondary | ICD-10-CM | POA: Diagnosis not present

## 2023-07-07 DIAGNOSIS — M5136 Other intervertebral disc degeneration, lumbar region with discogenic back pain only: Secondary | ICD-10-CM

## 2023-07-07 DIAGNOSIS — Z79891 Long term (current) use of opiate analgesic: Secondary | ICD-10-CM

## 2023-07-07 DIAGNOSIS — G8929 Other chronic pain: Secondary | ICD-10-CM

## 2023-07-07 DIAGNOSIS — M792 Neuralgia and neuritis, unspecified: Secondary | ICD-10-CM

## 2023-07-07 DIAGNOSIS — M5387 Other specified dorsopathies, lumbosacral region: Secondary | ICD-10-CM

## 2023-07-07 DIAGNOSIS — M5442 Lumbago with sciatica, left side: Secondary | ICD-10-CM

## 2023-07-07 DIAGNOSIS — G894 Chronic pain syndrome: Secondary | ICD-10-CM

## 2023-07-07 DIAGNOSIS — M47816 Spondylosis without myelopathy or radiculopathy, lumbar region: Secondary | ICD-10-CM | POA: Diagnosis not present

## 2023-07-07 MED ORDER — XTAMPZA ER 36 MG PO C12A: 2.0000 | EXTENDED_RELEASE_CAPSULE | Freq: Two times a day (BID) | ORAL | 0 refills | Status: DC

## 2023-07-07 MED ORDER — XTAMPZA ER 36 MG PO C12A: 2.0000 | EXTENDED_RELEASE_CAPSULE | Freq: Two times a day (BID) | ORAL | 0 refills | Status: AC

## 2023-07-08 NOTE — Progress Notes (Signed)
Virtual Visit via Telephone Note  I connected with Peter Lamb on 07/08/23 at  1:00 PM EST by telephone and verified that I am speaking with the correct person using two identifiers.  Location: Patient: Home Provider: Pain control center   I discussed the limitations, risks, security and privacy concerns of performing an evaluation and management service by telephone and the availability of in person appointments. I also discussed with the patient that there may be a patient responsible charge related to this service. The patient expressed understanding and agreed to proceed.   History of Present Illness: I do spoke with Peter Lamb via telephone as he was not able to do the video portion of the conference.  He reports that he is doing well.  He continues to take his Marlowe Kays and this works well for him.  2 tablets in the morning 2 tablets in the late afternoon and he gets good relief with this.  He rates a good about 75% improvement overall.  No side effects with the medicine are noted.  No change in lower extremity strength bowel or bladder function.  The quality characteristic and distribution of pain is stable.  He is tolerating chronic opioid therapy well with getting good functional improvement.  Review of systems: General: No fevers or chills Pulmonary: No shortness of breath or dyspnea Cardiac: No angina or palpitations or lightheadedness GI: No abdominal pain or constipation Psych: No depression    Observations/Objective:  Current Outpatient Medications:    [START ON 08/09/2023] oxyCODONE ER (XTAMPZA ER) 36 MG C12A, Take 2 capsules (72 mg total) by mouth 2 (two) times daily., Disp: 120 capsule, Rfl: 0   apixaban (ELIQUIS) 5 MG TABS tablet, TAKE ONE TABLET BY MOUTH TWICE A DAY (CAUTION: BLOOD THINNER) (Patient not taking: Reported on 06/16/2021), Disp: , Rfl:    aspirin 81 MG tablet, Take 162 mg by mouth daily., Disp: , Rfl:    atenolol (TENORMIN) 100 MG tablet, Take 50 mg by  mouth 2 (two) times daily., Disp: , Rfl:    diltiazem (CARDIZEM CD) 240 MG 24 hr capsule, Take 240 mg by mouth daily. (Patient not taking: Reported on 05/26/2021), Disp: , Rfl:    glipiZIDE (GLUCOTROL) 10 MG tablet, Take 10 mg 2 (two) times daily by mouth. (Patient not taking: Reported on 05/26/2021), Disp: , Rfl: 3   glucosamine-chondroitin 500-400 MG tablet, Take 1 tablet by mouth daily., Disp: , Rfl:    lisinopril (ZESTRIL) 10 MG tablet, Take 10 mg by mouth 2 (two) times daily., Disp: , Rfl:    Melatonin 10 MG TABS, Take by mouth at bedtime as needed., Disp: , Rfl:    metFORMIN (GLUCOPHAGE) 500 MG tablet, Take 1,000 mg by mouth 2 (two) times daily with a meal. Reported on 02/10/2016, Disp: , Rfl:    Multiple Vitamins-Minerals (VITAMINS TO GO MEN PO), Take 1 tablet by mouth daily. , Disp: , Rfl:    nitroGLYCERIN (NITROSTAT) 0.4 MG SL tablet, , Disp: , Rfl:    Omega-3 Fatty Acids (FISH OIL) 1000 MG CAPS, Take by mouth daily., Disp: , Rfl:    [START ON 07/10/2023] oxyCODONE ER (XTAMPZA ER) 36 MG C12A, Take 2 capsules (72 mg total) by mouth 2 (two) times daily., Disp: 120 capsule, Rfl: 0   rosuvastatin (CRESTOR) 10 MG tablet, Take 10 mg by mouth daily., Disp: , Rfl:    tadalafil (CIALIS) 20 MG tablet, Take 20 mg by mouth daily as needed., Disp: , Rfl:    tiZANidine (  ZANAFLEX) 4 MG tablet, Take 1 tablet (4 mg total) by mouth 2 (two) times daily., Disp: 180 tablet, Rfl: 5   TraZODone HCl 150 MG TB24, Take by mouth at bedtime., Disp: , Rfl:    VITAMIN E PO, Take 90 Units by mouth daily., Disp: , Rfl:   Past Medical History:  Diagnosis Date   Arthritis    back, neck   Carpal boss of right wrist    CHF (congestive heart failure) (HCC)    Depression    Diabetes mellitus without complication (HCC)    Dysplastic nevus 10/08/2021   superior med buttocks - Moderate   GERD (gastroesophageal reflux disease)    H/O tooth extraction    Hyperlipidemia    Hypertension     Assessment and Plan: 1.  Degeneration of intervertebral disc of lumbar region with discogenic back pain   2. Lumbar spondylosis with myelopathy   3. Facet arthritis of lumbar region   4. Musculoskeletal pain   5. Chronic pain syndrome   6. Sciatica of left side associated with disorder of lumbosacral spine   7. Chronic left-sided low back pain with left-sided sciatica   8. Spondylosis of lumbar region without myelopathy or radiculopathy   9. Long term current use of opiate analgesic   10. Neurogenic pain    Based on our conversation and upon review of the [facility] practitioner database information I think is appropriate to refill his medicines for the next 2 months.  No changes in his regimen will be initiated.  I have encouraged him to continue with stretching strengthening exercises daily walking and weight loss.  Continue follow-up with his primary care physicians for baseline medical care with return to clinic in 2 months.  Follow Up Instructions:    I discussed the assessment and treatment plan with the patient. The patient was provided an opportunity to ask questions and all were answered. The patient agreed with the plan and demonstrated an understanding of the instructions.   The patient was advised to call back or seek an in-person evaluation if the symptoms worsen or if the condition fails to improve as anticipated.  I provided 30 minutes of non-face-to-face time during this encounter.   [name], MD

## 2023-09-01 ENCOUNTER — Ambulatory Visit: Payer: Medicare Other | Attending: Anesthesiology | Admitting: Anesthesiology

## 2023-09-01 ENCOUNTER — Encounter: Payer: Self-pay | Admitting: Anesthesiology

## 2023-09-01 DIAGNOSIS — G8929 Other chronic pain: Secondary | ICD-10-CM

## 2023-09-01 DIAGNOSIS — M7918 Myalgia, other site: Secondary | ICD-10-CM

## 2023-09-01 DIAGNOSIS — M47816 Spondylosis without myelopathy or radiculopathy, lumbar region: Secondary | ICD-10-CM | POA: Diagnosis not present

## 2023-09-01 DIAGNOSIS — M4716 Other spondylosis with myelopathy, lumbar region: Secondary | ICD-10-CM | POA: Diagnosis not present

## 2023-09-01 DIAGNOSIS — Z79891 Long term (current) use of opiate analgesic: Secondary | ICD-10-CM

## 2023-09-01 DIAGNOSIS — M5136 Other intervertebral disc degeneration, lumbar region with discogenic back pain only: Secondary | ICD-10-CM

## 2023-09-01 DIAGNOSIS — M5387 Other specified dorsopathies, lumbosacral region: Secondary | ICD-10-CM

## 2023-09-01 DIAGNOSIS — M5442 Lumbago with sciatica, left side: Secondary | ICD-10-CM

## 2023-09-01 DIAGNOSIS — M792 Neuralgia and neuritis, unspecified: Secondary | ICD-10-CM

## 2023-09-01 DIAGNOSIS — G894 Chronic pain syndrome: Secondary | ICD-10-CM

## 2023-09-01 MED ORDER — XTAMPZA ER 36 MG PO C12A: 2.0000 | EXTENDED_RELEASE_CAPSULE | Freq: Two times a day (BID) | ORAL | 0 refills | Status: AC

## 2023-09-01 MED ORDER — XTAMPZA ER 36 MG PO C12A: 2.0000 | EXTENDED_RELEASE_CAPSULE | Freq: Two times a day (BID) | ORAL | 0 refills | Status: DC

## 2023-09-13 NOTE — Progress Notes (Signed)
 Virtual Visit via Telephone Note  I connected with Peter Lamb on 09/13/23 at  8:00 AM EST by telephone and verified that I am speaking with the correct person using two identifiers.  Location: Patient: Home Provider: Pain control center   I discussed the limitations, risks, security and privacy concerns of performing an evaluation and management service by telephone and the availability of in person appointments. I also discussed with the patient that there may be a patient responsible charge related to this service. The patient expressed understanding and agreed to proceed.   History of Present Illness: I was able to reach out to [facility] for his routine evaluation.  He was out of do this via video so we did a via telephone.  He reports that his low back pain has been acting up a bit in addition to his lower extremity sciatica symptoms.  He has reported excellent relief with previous epidural steroid injections with his most recent injection back in June 2024.  He had essentially complete relief of the sciatica symptom lasting about 3 months and about a 75% reduction in his low back pain lasting about 2 to 3 months as well..  Over the past few weeks has had recurrence of the sciatica symptom similar to what he is experienced in the past.  Despite efforts at stretching and ice heat or medication management this pain has been persistent to the point where he is requesting a repeat epidural as it is beginning to affect his ability to sleep at night and his pain medications are less effective.  No other change in lower extremity strength function bowel or bladder function is noted at this time.  It is just the sciatica that is reportedly severe and making the opioid chronic medication management less effective.  Review of systems: General: No fevers or chills Pulmonary: No shortness of breath or dyspnea Cardiac: No angina or palpitations or lightheadedness GI: No abdominal pain or  constipation Psych: No depression    Observations/Objective:  Current Outpatient Medications:    [START ON 10/08/2023] oxyCODONE  ER (XTAMPZA  ER) 36 MG C12A, Take 2 capsules (72 mg total) by mouth 2 (two) times daily., Disp: 120 capsule, Rfl: 0   apixaban (ELIQUIS) 5 MG TABS tablet, TAKE ONE TABLET BY MOUTH TWICE A DAY (CAUTION: BLOOD THINNER) (Patient not taking: Reported on 06/16/2021), Disp: , Rfl:    aspirin 81 MG tablet, Take 162 mg by mouth daily., Disp: , Rfl:    atenolol (TENORMIN) 100 MG tablet, Take 50 mg by mouth 2 (two) times daily., Disp: , Rfl:    diltiazem (CARDIZEM CD) 240 MG 24 hr capsule, Take 240 mg by mouth daily. (Patient not taking: Reported on 05/26/2021), Disp: , Rfl:    glipiZIDE (GLUCOTROL) 10 MG tablet, Take 10 mg 2 (two) times daily by mouth. (Patient not taking: Reported on 05/26/2021), Disp: , Rfl: 3   glucosamine-chondroitin 500-400 MG tablet, Take 1 tablet by mouth daily., Disp: , Rfl:    lisinopril (ZESTRIL) 10 MG tablet, Take 10 mg by mouth 2 (two) times daily., Disp: , Rfl:    Melatonin 10 MG TABS, Take by mouth at bedtime as needed., Disp: , Rfl:    metFORMIN (GLUCOPHAGE) 500 MG tablet, Take 1,000 mg by mouth 2 (two) times daily with a meal. Reported on 02/10/2016, Disp: , Rfl:    Multiple Vitamins-Minerals (VITAMINS TO GO MEN PO), Take 1 tablet by mouth daily. , Disp: , Rfl:    nitroGLYCERIN (NITROSTAT) 0.4 MG  SL tablet, , Disp: , Rfl:    Omega-3 Fatty Acids (FISH OIL) 1000 MG CAPS, Take by mouth daily., Disp: , Rfl:    oxyCODONE  ER (XTAMPZA  ER) 36 MG C12A, Take 2 capsules (72 mg total) by mouth 2 (two) times daily., Disp: 120 capsule, Rfl: 0   rosuvastatin (CRESTOR) 10 MG tablet, Take 10 mg by mouth daily., Disp: , Rfl:    tadalafil (CIALIS) 20 MG tablet, Take 20 mg by mouth daily as needed., Disp: , Rfl:    tiZANidine  (ZANAFLEX ) 4 MG tablet, Take 1 tablet (4 mg total) by mouth 2 (two) times daily., Disp: 180 tablet, Rfl: 5   TraZODone HCl 150 MG TB24, Take  by mouth at bedtime., Disp: , Rfl:    VITAMIN E PO, Take 90 Units by mouth daily., Disp: , Rfl:    Past Medical History:  Diagnosis Date   Arthritis    back, neck   Carpal boss of right wrist    CHF (congestive heart failure) (HCC)    Depression    Diabetes mellitus without complication (HCC)    Dysplastic nevus 10/08/2021   superior med buttocks - Moderate   GERD (gastroesophageal reflux disease)    H/O tooth extraction    Hyperlipidemia    Hypertension    Assessment and Plan:  1. Degeneration of intervertebral disc of lumbar region with discogenic back pain   2. Lumbar spondylosis with myelopathy   3. Facet arthritis of lumbar region   4. Musculoskeletal pain   5. Chronic pain syndrome   6. Sciatica of left side associated with disorder of lumbosacral spine   7. Chronic left-sided low back pain with left-sided sciatica   8. Spondylosis of lumbar region without myelopathy or radiculopathy   9. Long term current use of opiate analgesic   10. Neurogenic pain    Based on our conversation I gets appropriate to refill his opioid medications.  This to be done for the next 2 months dated for February 12 and March 14.  He has responded favorably to epidural steroids in the past and unfortunately has failed more conservative therapy therefore I am going to request a repeat epidural here within the next several weeks.  He understands the risks and benefits of the procedure and has no questions.  Continue with core stretching strengthening and efforts at weight loss with continued chronic opioid management.  Continue follow-up with his primary care physicians for baseline medical care. Follow Up Instructions:    I discussed the assessment and treatment plan with the patient. The patient was provided an opportunity to ask questions and all were answered. The patient agreed with the plan and demonstrated an understanding of the instructions.   The patient was advised to call back or seek an  in-person evaluation if the symptoms worsen or if the condition fails to improve as anticipated.  I provided 30 minutes of non-face-to-face time during this encounter.   Lynwood KANDICE Clause, MD

## 2023-09-19 ENCOUNTER — Other Ambulatory Visit: Payer: Self-pay | Admitting: Anesthesiology

## 2023-09-21 ENCOUNTER — Encounter: Payer: Self-pay | Admitting: Anesthesiology

## 2023-09-21 ENCOUNTER — Ambulatory Visit
Admission: RE | Admit: 2023-09-21 | Discharge: 2023-09-21 | Disposition: A | Discharge status: Home / Self Care | Payer: Medicare Other | Source: Ambulatory Visit | Attending: Anesthesiology | Admitting: Anesthesiology

## 2023-09-21 ENCOUNTER — Other Ambulatory Visit: Payer: Self-pay | Admitting: Anesthesiology

## 2023-09-21 ENCOUNTER — Ambulatory Visit (HOSPITAL_BASED_OUTPATIENT_CLINIC_OR_DEPARTMENT_OTHER): Payer: Medicare Other | Admitting: Anesthesiology

## 2023-09-21 VITALS — BP 153/98 | HR 59 | Resp 15 | Ht 70.0 in | Wt 225.0 lb

## 2023-09-21 DIAGNOSIS — M5387 Other specified dorsopathies, lumbosacral region: Secondary | ICD-10-CM | POA: Diagnosis present

## 2023-09-21 DIAGNOSIS — R52 Pain, unspecified: Secondary | ICD-10-CM | POA: Insufficient documentation

## 2023-09-21 DIAGNOSIS — Z79891 Long term (current) use of opiate analgesic: Secondary | ICD-10-CM | POA: Diagnosis present

## 2023-09-21 DIAGNOSIS — M792 Neuralgia and neuritis, unspecified: Secondary | ICD-10-CM | POA: Diagnosis present

## 2023-09-21 DIAGNOSIS — M5136 Other intervertebral disc degeneration, lumbar region with discogenic back pain only: Secondary | ICD-10-CM

## 2023-09-21 DIAGNOSIS — G894 Chronic pain syndrome: Secondary | ICD-10-CM | POA: Diagnosis present

## 2023-09-21 DIAGNOSIS — M7918 Myalgia, other site: Secondary | ICD-10-CM | POA: Diagnosis present

## 2023-09-21 DIAGNOSIS — M4716 Other spondylosis with myelopathy, lumbar region: Secondary | ICD-10-CM | POA: Insufficient documentation

## 2023-09-21 DIAGNOSIS — M47816 Spondylosis without myelopathy or radiculopathy, lumbar region: Secondary | ICD-10-CM | POA: Insufficient documentation

## 2023-09-21 MED ORDER — ROPIVACAINE HCL 2 MG/ML IJ SOLN
INTRAMUSCULAR | Status: AC
  Filled 2023-09-21: qty 20

## 2023-09-21 MED ORDER — XTAMPZA ER 36 MG PO C12A: 2.0000 | EXTENDED_RELEASE_CAPSULE | Freq: Two times a day (BID) | ORAL | 0 refills | Status: DC

## 2023-09-21 MED ORDER — TIZANIDINE HCL 4 MG PO TABS: 4.0000 mg | ORAL_TABLET | Freq: Two times a day (BID) | ORAL | 5 refills | Status: DC

## 2023-09-21 MED ORDER — SODIUM CHLORIDE 0.9% FLUSH
10.0000 mL | Freq: Once | INTRAVENOUS | Status: AC
  Administered 2023-09-21: 5 mL

## 2023-09-21 MED ORDER — SODIUM CHLORIDE (PF) 0.9 % IJ SOLN
INTRAMUSCULAR | Status: AC
  Filled 2023-09-21: qty 10

## 2023-09-21 MED ORDER — IOHEXOL 180 MG/ML  SOLN
INTRAMUSCULAR | Status: AC
  Filled 2023-09-21: qty 20

## 2023-09-21 MED ORDER — ROPIVACAINE HCL 2 MG/ML IJ SOLN
10.0000 mL | Freq: Once | INTRAMUSCULAR | Status: AC
  Administered 2023-09-21: 10 mL via EPIDURAL

## 2023-09-21 MED ORDER — TRIAMCINOLONE ACETONIDE 40 MG/ML IJ SUSP
INTRAMUSCULAR | Status: AC
  Filled 2023-09-21: qty 1

## 2023-09-21 MED ORDER — LIDOCAINE HCL (PF) 1 % IJ SOLN
INTRAMUSCULAR | Status: AC
  Filled 2023-09-21: qty 10

## 2023-09-21 MED ORDER — IOHEXOL 180 MG/ML  SOLN
10.0000 mL | Freq: Once | INTRAMUSCULAR | Status: AC | PRN
  Administered 2023-09-21: 10 mL via EPIDURAL

## 2023-09-21 MED ORDER — LIDOCAINE HCL (PF) 1 % IJ SOLN
5.0000 mL | Freq: Once | INTRAMUSCULAR | Status: AC
  Administered 2023-09-21: 5 mL via SUBCUTANEOUS

## 2023-09-21 MED ORDER — TRIAMCINOLONE ACETONIDE 40 MG/ML IJ SUSP
40.0000 mg | Freq: Once | INTRAMUSCULAR | Status: AC
  Administered 2023-09-21: 40 mg

## 2023-09-21 NOTE — Progress Notes (Signed)
 Nursing Pain Medication Assessment:  Safety precautions to be maintained throughout the outpatient stay will include: orient to surroundings, keep bed in low position, maintain call bell within reach at all times, provide assistance with transfer out of bed and ambulation.  Medication Inspection Compliance: Pill count conducted under aseptic conditions, in front of the patient. Neither the pills nor the bottle was removed from the patient's sight at any time. Once count was completed pills were immediately returned to the patient in their original bottle.  Medication:  xtampza Pill/Patch Count:  66 of 120 pills remain Pill/Patch Appearance: Markings consistent with prescribed medication Bottle Appearance: Standard pharmacy container. Clearly labeled. Filled Date: 02 / 12 / 2025 Last Medication intake:  Today

## 2023-09-21 NOTE — Patient Instructions (Signed)

## 2023-09-21 NOTE — Progress Notes (Signed)
 Subjective:  Patient ID: Peter Lamb, male    DOB: 1956-04-26  Age: 68 y.o. MRN: [id number]  CC: Back Pain (Lower and left)   Procedure: L5-S1 epidural steroid under fluoroscopic guidance with no sedation  HPI Peter Lamb presents for reevaluation.  He is having recurrence of his left lower leg sciatica and continuation of his low back pain.  He has epidural steroids about twice a year secondary to recalcitrant low back pain and sciatica symptoms.  He generally gets about 80 to 100% relief of the left leg sciatica for about 2 months and a 75% reduction in the low back pain that has been chronic.  Over the course of the next 2 months he gets a gradual recurrence of the same sciatica pain with crescendo at about the 4 to 38-month mark following an epidural.  He presents today requesting a repeat epidural.  No change in lower extremity strength function bowel or bladder function is noted at this time.  He is doing well with the Xtampza taking 2 tablets twice a day and this keeps his baseline low back pain under control.  No side effects are reported.  Outpatient Medications Prior to Visit  Medication Sig Dispense Refill   aspirin 81 MG tablet Take 162 mg by mouth daily.     atenolol (TENORMIN) 100 MG tablet Take 50 mg by mouth 2 (two) times daily.     diltiazem (CARDIZEM CD) 240 MG 24 hr capsule Take 240 mg by mouth daily.     glipiZIDE (GLUCOTROL) 10 MG tablet Take 10 mg by mouth 2 (two) times daily.  3   glucosamine-chondroitin 500-400 MG tablet Take 1 tablet by mouth daily.     lisinopril (ZESTRIL) 10 MG tablet Take 10 mg by mouth 2 (two) times daily.     Melatonin 10 MG TABS Take by mouth at bedtime as needed.     metFORMIN (GLUCOPHAGE) 500 MG tablet Take 1,000 mg by mouth 2 (two) times daily with a meal. Reported on 02/10/2016     Multiple Vitamins-Minerals (VITAMINS TO GO MEN PO) Take 1 tablet by mouth daily.      Omega-3 Fatty Acids (FISH OIL) 1000 MG CAPS Take by mouth daily.      [START ON 10/08/2023] oxyCODONE ER (XTAMPZA ER) 36 MG C12A Take 2 capsules (72 mg total) by mouth 2 (two) times daily. 120 capsule 0   rosuvastatin (CRESTOR) 10 MG tablet Take 10 mg by mouth daily.     tadalafil (CIALIS) 20 MG tablet Take 20 mg by mouth daily as needed.     TraZODone HCl 150 MG TB24 Take by mouth at bedtime.     VITAMIN E PO Take 90 Units by mouth daily.     oxyCODONE ER (XTAMPZA ER) 36 MG C12A Take 2 capsules (72 mg total) by mouth 2 (two) times daily. 120 capsule 0   tiZANidine (ZANAFLEX) 4 MG tablet Take 1 tablet (4 mg total) by mouth 2 (two) times daily. 180 tablet 5   apixaban (ELIQUIS) 5 MG TABS tablet TAKE ONE TABLET BY MOUTH TWICE A DAY (CAUTION: BLOOD THINNER) (Patient not taking: Reported on 06/16/2021)     nitroGLYCERIN (NITROSTAT) 0.4 MG SL tablet      No facility-administered medications prior to visit.    Review of Systems CNS: No confusion or sedation Cardiac: No angina or palpitations GI: No abdominal pain or constipation Constitutional: No nausea vomiting fevers or chills  Objective:  BP (!) 153/98  Pulse (!) 59   Resp 15   Ht 5\' 10"  (1.778 m)   Wt 225 lb (102.1 kg)   SpO2 96%   BMI 32.28 kg/m    BP Readings from Last 3 Encounters:  09/21/23 (!) 153/98  01/11/23 (!) 155/79  01/09/23 (!) 174/92     Wt Readings from Last 3 Encounters:  09/21/23 225 lb (102.1 kg)  01/11/23 210 lb (95.3 kg)  01/09/23 210 lb (95.3 kg)     Physical Exam Pt is alert and oriented PERRL EOMI HEART IS RRR no murmur or rub LCTA no wheezing or rales MUSCULOSKELETAL reveals some paraspinous muscle tenderness and left leg sciatic symptoms with a positive straight leg raise at about 45 degrees negative on the right side some paraspinous muscle tenderness in the lumbar region but no overt trigger points.  He walks with an antalgic gait and lower extremity strength and muscle tone are baseline.  Labs  Lab Results  Component Value Date   HGBA1C 9.0 03/04/2010    Lab Results  Component Value Date   CREATININE 1.00 01/09/2023    -------------------------------------------------------------------------------------------------------------------- Lab Results  Component Value Date   WBC 5.1 01/09/2023   HGB 15.1 01/09/2023   HCT 44.7 01/09/2023   PLT 152 01/09/2023   GLUCOSE 164 (H) 01/09/2023   ALT 31 03/04/2010   AST 23 03/04/2010   NA 137 01/09/2023   K 4.1 01/09/2023   CL 99 01/09/2023   CREATININE 1.00 01/09/2023   BUN 14 01/09/2023   CO2 24 01/09/2023   PSA 0.26 03/04/2010   HGBA1C 9.0 03/04/2010    --------------------------------------------------------------------------------------------------------------------- DG PAIN CLINIC C-ARM 1-60 MIN NO REPORT Result Date: 09/21/2023 Fluoro was used, but no Radiologist interpretation will be provided. Please refer to "NOTES" tab for provider progress note.    Assessment & Plan:   Peter Lamb was seen today for back pain.  Diagnoses and all orders for this visit:  Degeneration of intervertebral disc of lumbar region with discogenic back pain  Lumbar spondylosis with myelopathy  Facet arthritis of lumbar region  Musculoskeletal pain  Chronic pain syndrome  Sciatica of left side associated with disorder of lumbosacral spine  Spondylosis of lumbar region without myelopathy or radiculopathy  Long term current use of opiate analgesic  Neurogenic pain  Other orders -     oxyCODONE ER (XTAMPZA ER) 36 MG C12A; Take 2 capsules (72 mg total) by mouth 2 (two) times daily. -     tiZANidine (ZANAFLEX) 4 MG tablet; Take 1 tablet (4 mg total) by mouth 2 (two) times daily. -     triamcinolone acetonide (KENALOG-40) injection 40 mg -     sodium chloride flush (NS) 0.9 % injection 10 mL -     ropivacaine (PF) 2 mg/mL (0.2%) (NAROPIN) injection 10 mL -     lidocaine (PF) (XYLOCAINE) 1 % injection 5 mL -     iohexol (OMNIPAQUE) 180 MG/ML injection 10  mL        ----------------------------------------------------------------------------------------------------------------------  Problem List Items Addressed This Visit       Unprioritized   Chronic pain (Chronic)   Relevant Medications   oxyCODONE ER (XTAMPZA ER) 36 MG C12A (Start on 11/07/2023)   tiZANidine (ZANAFLEX) 4 MG tablet   Long term current use of opiate analgesic (Chronic)   Musculoskeletal pain (Chronic)   Neurogenic pain (Chronic)   DDD (degenerative disc disease), lumbar - Primary   Relevant Medications   oxyCODONE ER (XTAMPZA ER) 36 MG C12A (Start on 11/07/2023)  tiZANidine (ZANAFLEX) 4 MG tablet   Sciatica of left side associated with disorder of lumbosacral spine   Relevant Medications   tiZANidine (ZANAFLEX) 4 MG tablet   Spondylosis of lumbar region without myelopathy or radiculopathy   Relevant Medications   oxyCODONE ER (XTAMPZA ER) 36 MG C12A (Start on 11/07/2023)   tiZANidine (ZANAFLEX) 4 MG tablet   Other Visit Diagnoses       Lumbar spondylosis with myelopathy       Relevant Medications   oxyCODONE ER (XTAMPZA ER) 36 MG C12A (Start on 11/07/2023)   tiZANidine (ZANAFLEX) 4 MG tablet   triamcinolone acetonide (KENALOG-40) injection 40 mg (Completed)     Facet arthritis of lumbar region       Relevant Medications   oxyCODONE ER (XTAMPZA ER) 36 MG C12A (Start on 11/07/2023)   tiZANidine (ZANAFLEX) 4 MG tablet   triamcinolone acetonide (KENALOG-40) injection 40 mg (Completed)         ----------------------------------------------------------------------------------------------------------------------  1. Degeneration of intervertebral disc of lumbar region with discogenic back pain (Primary) Will proceed with a repeat epidural today.  We gone over the risks and benefits of the procedure all questions answered we will have him scheduled for return in 1 month  2. Lumbar spondylosis with myelopathy As above  3. Facet arthritis of lumbar  region As above  4. Musculoskeletal pain Continue core stretching strengthening exercises and efforts at weight loss as reviewed  5. Chronic pain syndrome I have reviewed the [facility] practitioner database information is appropriate for refill.  This to be for the next 2 months  6. Sciatica of left side associated with disorder of lumbosacral spine As above  7. Spondylosis of lumbar region without myelopathy or radiculopathy   8. Long term current use of opiate analgesic   9. Neurogenic pain Continue follow-up with his primary care physicians for baseline medical care    ----------------------------------------------------------------------------------------------------------------------  I am having Peter Lamb maintain his aspirin, metFORMIN, atenolol, diltiazem, Multiple Vitamins-Minerals (VITAMINS TO GO MEN PO), rosuvastatin, TraZODone HCl, glipiZIDE, nitroGLYCERIN, Fish Oil, VITAMIN E PO, Melatonin, glucosamine-chondroitin, tadalafil, apixaban, lisinopril, Xtampza ER, Xtampza ER, and tiZANidine. We administered triamcinolone acetonide, sodium chloride flush, ropivacaine (PF) 2 mg/mL (0.2%), lidocaine (PF), and iohexol.   Meds ordered this encounter  Medications   oxyCODONE ER (XTAMPZA ER) 36 MG C12A    Sig: Take 2 capsules (72 mg total) by mouth 2 (two) times daily.    Dispense:  120 capsule    Refill:  0   tiZANidine (ZANAFLEX) 4 MG tablet    Sig: Take 1 tablet (4 mg total) by mouth 2 (two) times daily.    Dispense:  60 tablet    Refill:  5    NEW RX   triamcinolone acetonide (KENALOG-40) injection 40 mg   sodium chloride flush (NS) 0.9 % injection 10 mL   ropivacaine (PF) 2 mg/mL (0.2%) (NAROPIN) injection 10 mL   lidocaine (PF) (XYLOCAINE) 1 % injection 5 mL   iohexol (OMNIPAQUE) 180 MG/ML injection 10 mL   Patient's Medications  New Prescriptions   No medications on file  Previous Medications   APIXABAN (ELIQUIS) 5 MG TABS TABLET    TAKE ONE TABLET  BY MOUTH TWICE A DAY (CAUTION: BLOOD THINNER)   ASPIRIN 81 MG TABLET    Take 162 mg by mouth daily.   ATENOLOL (TENORMIN) 100 MG TABLET    Take 50 mg by mouth 2 (two) times daily.   DILTIAZEM (CARDIZEM CD) 240 MG 24  HR CAPSULE    Take 240 mg by mouth daily.   GLIPIZIDE (GLUCOTROL) 10 MG TABLET    Take 10 mg by mouth 2 (two) times daily.   GLUCOSAMINE-CHONDROITIN 500-400 MG TABLET    Take 1 tablet by mouth daily.   LISINOPRIL (ZESTRIL) 10 MG TABLET    Take 10 mg by mouth 2 (two) times daily.   MELATONIN 10 MG TABS    Take by mouth at bedtime as needed.   METFORMIN (GLUCOPHAGE) 500 MG TABLET    Take 1,000 mg by mouth 2 (two) times daily with a meal. Reported on 02/10/2016   MULTIPLE VITAMINS-MINERALS (VITAMINS TO GO MEN PO)    Take 1 tablet by mouth daily.    NITROGLYCERIN (NITROSTAT) 0.4 MG SL TABLET       OMEGA-3 FATTY ACIDS (FISH OIL) 1000 MG CAPS    Take by mouth daily.   OXYCODONE ER (XTAMPZA ER) 36 MG C12A    Take 2 capsules (72 mg total) by mouth 2 (two) times daily.   ROSUVASTATIN (CRESTOR) 10 MG TABLET    Take 10 mg by mouth daily.   TADALAFIL (CIALIS) 20 MG TABLET    Take 20 mg by mouth daily as needed.   TRAZODONE HCL 150 MG TB24    Take by mouth at bedtime.   VITAMIN E PO    Take 90 Units by mouth daily.  Modified Medications   Modified Medication Previous Medication   OXYCODONE ER (XTAMPZA ER) 36 MG C12A oxyCODONE ER (XTAMPZA ER) 36 MG C12A      Take 2 capsules (72 mg total) by mouth 2 (two) times daily.    Take 2 capsules (72 mg total) by mouth 2 (two) times daily.   TIZANIDINE (ZANAFLEX) 4 MG TABLET tiZANidine (ZANAFLEX) 4 MG tablet      Take 1 tablet (4 mg total) by mouth 2 (two) times daily.    Take 1 tablet (4 mg total) by mouth 2 (two) times daily.  Discontinued Medications   No medications on file   ----------------------------------------------------------------------------------------------------------------------  Follow-up: Return in about 1 month (around 10/19/2023).    Procedure: L5-S1 LESI with fluoroscopic guidance and without moderate sedation  NOTE: The risks, benefits, and expectations of the procedure have been discussed and explained to the patient who was understanding and in agreement with suggested treatment plan. No guarantees were made.  DESCRIPTION OF PROCEDURE: Lumbar epidural steroid injection with no IV Versed, EKG, blood pressure, pulse, and pulse oximetry monitoring. The procedure was performed with the patient in the prone position under fluoroscopic guidance.  Sterile prep x3 was initiated and I then injected subcutaneous lidocaine to the overlying L5-S1 site after its fluoroscopic identifictation.  Using strict aseptic technique, I then advanced an 18-gauge Tuohy epidural needle in the midline using interlaminar approach via loss-of-resistance to saline technique. There was negative aspiration for heme or  CSF.  I then confirmed position with both AP and Lateral fluoroscan.  2 cc of contrast dye were injected and a  total of 5 mL of Preservative-Free normal saline mixed with 40 mg of Kenalog and 1cc Ropicaine 0.2 percent were injected incrementally via the  epidurally placed needle. The needle was removed. The patient tolerated the injection well and was convalesced and discharged to home in stable condition. Should the patient have any post procedure difficulty they have been instructed on how to contact us for assistance.   [name], MD

## 2023-10-18 ENCOUNTER — Ambulatory Visit: Payer: Medicare Other | Attending: Anesthesiology | Admitting: Anesthesiology

## 2023-10-18 ENCOUNTER — Encounter: Payer: Self-pay | Admitting: Anesthesiology

## 2023-10-18 DIAGNOSIS — M47816 Spondylosis without myelopathy or radiculopathy, lumbar region: Secondary | ICD-10-CM

## 2023-10-18 DIAGNOSIS — M5136 Other intervertebral disc degeneration, lumbar region with discogenic back pain only: Secondary | ICD-10-CM

## 2023-10-18 DIAGNOSIS — M4716 Other spondylosis with myelopathy, lumbar region: Secondary | ICD-10-CM

## 2023-10-18 DIAGNOSIS — Z79891 Long term (current) use of opiate analgesic: Secondary | ICD-10-CM

## 2023-10-18 DIAGNOSIS — G894 Chronic pain syndrome: Secondary | ICD-10-CM

## 2023-10-18 DIAGNOSIS — G8929 Other chronic pain: Secondary | ICD-10-CM

## 2023-10-18 DIAGNOSIS — M792 Neuralgia and neuritis, unspecified: Secondary | ICD-10-CM

## 2023-10-18 DIAGNOSIS — M7918 Myalgia, other site: Secondary | ICD-10-CM

## 2023-10-18 DIAGNOSIS — M5387 Other specified dorsopathies, lumbosacral region: Secondary | ICD-10-CM

## 2023-10-18 NOTE — Patient Instructions (Signed)

## 2023-10-19 NOTE — Progress Notes (Signed)
 I was unable to get in touch with review for his follow-up evaluation after his epidural approximately a month ago.  A message was left but we will plan on seeing him in 1 more month.

## 2023-11-30 ENCOUNTER — Encounter: Payer: Self-pay | Admitting: Anesthesiology

## 2023-11-30 ENCOUNTER — Ambulatory Visit: Attending: Anesthesiology | Admitting: Anesthesiology

## 2023-11-30 DIAGNOSIS — Z79891 Long term (current) use of opiate analgesic: Secondary | ICD-10-CM

## 2023-11-30 DIAGNOSIS — M7918 Myalgia, other site: Secondary | ICD-10-CM

## 2023-11-30 DIAGNOSIS — G8929 Other chronic pain: Secondary | ICD-10-CM

## 2023-11-30 DIAGNOSIS — M5136 Other intervertebral disc degeneration, lumbar region with discogenic back pain only: Secondary | ICD-10-CM | POA: Diagnosis not present

## 2023-11-30 DIAGNOSIS — M47816 Spondylosis without myelopathy or radiculopathy, lumbar region: Secondary | ICD-10-CM | POA: Diagnosis not present

## 2023-11-30 DIAGNOSIS — M5387 Other specified dorsopathies, lumbosacral region: Secondary | ICD-10-CM

## 2023-11-30 DIAGNOSIS — M4716 Other spondylosis with myelopathy, lumbar region: Secondary | ICD-10-CM

## 2023-11-30 DIAGNOSIS — G894 Chronic pain syndrome: Secondary | ICD-10-CM

## 2023-11-30 DIAGNOSIS — M5442 Lumbago with sciatica, left side: Secondary | ICD-10-CM

## 2023-11-30 DIAGNOSIS — M792 Neuralgia and neuritis, unspecified: Secondary | ICD-10-CM

## 2023-11-30 MED ORDER — XTAMPZA ER 36 MG PO C12A: 2.0000 | EXTENDED_RELEASE_CAPSULE | Freq: Two times a day (BID) | ORAL | 0 refills | Status: AC

## 2023-11-30 NOTE — Progress Notes (Signed)
 Virtual Visit via Telephone Note  I connected with Peter Lamb on 11/30/23 at  1:00 PM EDT by telephone and verified that I am speaking with the correct person using two identifiers.  Location: Patient: Home Provider: Pain control center   I discussed the limitations, risks, security and privacy concerns of performing an evaluation and management service by telephone and the availability of in person appointments. I also discussed with the patient that there may be a patient responsible charge related to this service. The patient expressed understanding and agreed to proceed.   History of Present Illness: I spoke to Peter Lamb via telephone as we were unable link for the video portion of the conference.  He reports that his low back pain is still doing well with the Xtampza .  He takes 2 tablets twice a day and this continues to work well for him.  No side effects with the medication are noted.  He is also doing well following his most recent epidural a few months ago.  His sciatica is generally well-controlled he is working on his stretching strengthening exercises daily and trying to walk for aerobic conditioning.  Otherwise he is in his usual state of health with the quality characteristic and distribution of the low back pain and leg pain staying stable.  No change in lower extremity strength or function bowel or bladder function is noted.  No side effects with his medications are noted.  Review of systems: General: No fevers or chills Pulmonary: No shortness of breath or dyspnea Cardiac: No angina or palpitations or lightheadedness GI: No abdominal pain or constipation Psych: No depression    Observations/Objective:  Current Outpatient Medications:    [START ON 01/06/2024] oxyCODONE  ER (XTAMPZA  ER) 36 MG C12A, Take 2 capsules (72 mg total) by mouth 2 (two) times daily., Disp: 120 capsule, Rfl: 0   apixaban (ELIQUIS) 5 MG TABS tablet, TAKE ONE TABLET BY MOUTH TWICE A DAY (CAUTION:  BLOOD THINNER) (Patient not taking: Reported on 06/16/2021), Disp: , Rfl:    aspirin 81 MG tablet, Take 162 mg by mouth daily., Disp: , Rfl:    atenolol (TENORMIN) 100 MG tablet, Take 50 mg by mouth 2 (two) times daily., Disp: , Rfl:    diltiazem (CARDIZEM CD) 240 MG 24 hr capsule, Take 240 mg by mouth daily., Disp: , Rfl:    glipiZIDE (GLUCOTROL) 10 MG tablet, Take 10 mg by mouth 2 (two) times daily., Disp: , Rfl: 3   glucosamine-chondroitin 500-400 MG tablet, Take 1 tablet by mouth daily., Disp: , Rfl:    lisinopril (ZESTRIL) 10 MG tablet, Take 10 mg by mouth 2 (two) times daily., Disp: , Rfl:    Melatonin 10 MG TABS, Take by mouth at bedtime as needed., Disp: , Rfl:    metFORMIN (GLUCOPHAGE) 500 MG tablet, Take 1,000 mg by mouth 2 (two) times daily with a meal. Reported on 02/10/2016, Disp: , Rfl:    Multiple Vitamins-Minerals (VITAMINS TO GO MEN PO), Take 1 tablet by mouth daily. , Disp: , Rfl:    nitroGLYCERIN (NITROSTAT) 0.4 MG SL tablet, , Disp: , Rfl:    Omega-3 Fatty Acids (FISH OIL) 1000 MG CAPS, Take by mouth daily., Disp: , Rfl:    [START ON 12/07/2023] oxyCODONE  ER (XTAMPZA  ER) 36 MG C12A, Take 2 capsules (72 mg total) by mouth 2 (two) times daily., Disp: 120 capsule, Rfl: 0   rosuvastatin (CRESTOR) 10 MG tablet, Take 10 mg by mouth daily., Disp: , Rfl:  tadalafil (CIALIS) 20 MG tablet, Take 20 mg by mouth daily as needed., Disp: , Rfl:    tiZANidine  (ZANAFLEX ) 4 MG tablet, Take 1 tablet (4 mg total) by mouth 2 (two) times daily., Disp: 60 tablet, Rfl: 5   TraZODone HCl 150 MG TB24, Take by mouth at bedtime., Disp: , Rfl:    VITAMIN E PO, Take 90 Units by mouth daily., Disp: , Rfl:    Past Medical History:  Diagnosis Date   Arthritis    back, neck   Carpal boss of right wrist    CHF (congestive heart failure) (HCC)    Depression    Diabetes mellitus without complication (HCC)    Dysplastic nevus 10/08/2021   superior med buttocks - Moderate   GERD (gastroesophageal reflux  disease)    H/O tooth extraction    Hyperlipidemia    Hypertension    Assessment and Plan:  1. Degeneration of intervertebral disc of lumbar region with discogenic back pain   2. Lumbar spondylosis with myelopathy   3. Facet arthritis of lumbar region   4. Musculoskeletal pain   5. Chronic pain syndrome   6. Sciatica of left side associated with disorder of lumbosacral spine   7. Spondylosis of lumbar region without myelopathy or radiculopathy   8. Long term current use of opiate analgesic   9. Neurogenic pain   10. Chronic left-sided low back pain with left-sided sciatica    Based on our conversation and after review of the [facility]  practitioner database information agrees appropriate refills medicines for the next 2 months dated for May 13 and June 12.  Will keep him on the Xtampza  2 tablets twice a day and continue tizanidine  for muscle spasms.  Continue efforts at stretching strengthening on a daily basis and continue follow-up with his primary care physicians for baseline medical care with return to clinic scheduled in 2 months.  No other changes are initiated. Follow Up Instructions:    I discussed the assessment and treatment plan with the patient. The patient was provided an opportunity to ask questions and all were answered. The patient agreed with the plan and demonstrated an understanding of the instructions.   The patient was advised to call back or seek an in-person evaluation if the symptoms worsen or if the condition fails to improve as anticipated.  I provided 30 minutes of non-face-to-face time during this encounter.   Zula Hitch, MD

## 2024-02-06 ENCOUNTER — Emergency Department

## 2024-02-06 ENCOUNTER — Other Ambulatory Visit: Payer: Self-pay

## 2024-02-06 ENCOUNTER — Emergency Department
Admission: EM | Admit: 2024-02-06 | Discharge: 2024-02-06 | Disposition: A | Discharge status: Home / Self Care | Attending: Emergency Medicine | Admitting: Emergency Medicine

## 2024-02-06 DIAGNOSIS — I509 Heart failure, unspecified: Secondary | ICD-10-CM | POA: Insufficient documentation

## 2024-02-06 DIAGNOSIS — E119 Type 2 diabetes mellitus without complications: Secondary | ICD-10-CM | POA: Diagnosis not present

## 2024-02-06 DIAGNOSIS — Z7984 Long term (current) use of oral hypoglycemic drugs: Secondary | ICD-10-CM | POA: Diagnosis not present

## 2024-02-06 DIAGNOSIS — R079 Chest pain, unspecified: Secondary | ICD-10-CM

## 2024-02-06 DIAGNOSIS — R0789 Other chest pain: Secondary | ICD-10-CM | POA: Diagnosis present

## 2024-02-06 DIAGNOSIS — Z7901 Long term (current) use of anticoagulants: Secondary | ICD-10-CM | POA: Diagnosis not present

## 2024-02-06 DIAGNOSIS — F1123 Opioid dependence with withdrawal: Secondary | ICD-10-CM | POA: Diagnosis not present

## 2024-02-06 DIAGNOSIS — I110 Hypertensive heart disease with heart failure: Secondary | ICD-10-CM | POA: Insufficient documentation

## 2024-02-06 DIAGNOSIS — Z79899 Other long term (current) drug therapy: Secondary | ICD-10-CM | POA: Insufficient documentation

## 2024-02-06 DIAGNOSIS — F1193 Opioid use, unspecified with withdrawal: Secondary | ICD-10-CM

## 2024-02-06 DIAGNOSIS — Z7982 Long term (current) use of aspirin: Secondary | ICD-10-CM | POA: Diagnosis not present

## 2024-02-06 LAB — BASIC METABOLIC PANEL WITH GFR
Anion gap: 11 (ref 5–15)
BUN: 15 mg/dL (ref 8–23)
CO2: 26 mmol/L (ref 22–32)
Calcium: 8.9 mg/dL (ref 8.9–10.3)
Chloride: 100 mmol/L (ref 98–111)
Creatinine, Ser: 1.02 mg/dL (ref 0.61–1.24)
GFR, Estimated: >60 mL/min (ref >60)
Glucose, Bld: 173 mg/dL — ABNORMAL HIGH (ref 70–99)
Potassium: 4 mmol/L (ref 3.5–5.1)
Sodium: 137 mmol/L (ref 135–145)

## 2024-02-06 LAB — CBC
HCT: 40.1 % (ref 39.0–52.0)
Hemoglobin: 14.3 g/dL (ref 13.0–17.0)
MCH: 31.4 pg (ref 26.0–34.0)
MCHC: 35.7 g/dL (ref 30.0–36.0)
MCV: 87.9 fL (ref 80.0–100.0)
Platelets: 145 K/uL — ABNORMAL LOW (ref 150–400)
RBC: 4.56 MIL/uL (ref 4.22–5.81)
RDW: 12.3 % (ref 11.5–15.5)
WBC: 6.2 K/uL (ref 4.0–10.5)
nRBC: 0 % (ref 0.0–0.2)

## 2024-02-06 LAB — MAGNESIUM: Magnesium: 2.1 mg/dL (ref 1.7–2.4)

## 2024-02-06 LAB — BRAIN NATRIURETIC PEPTIDE: B Natriuretic Peptide: 18.6 pg/mL (ref 0.0–100.0)

## 2024-02-06 LAB — TROPONIN I (HIGH SENSITIVITY)
Troponin I (High Sensitivity): 5 ng/L (ref <18)
Troponin I (High Sensitivity): 4 ng/L (ref <18)

## 2024-02-06 LAB — TSH: TSH: 0.995 u[IU]/mL (ref 0.350–4.500)

## 2024-02-06 LAB — T4, FREE: Free T4: 0.88 ng/dL (ref 0.61–1.12)

## 2024-02-06 MED ORDER — OXYCODONE HCL ER 10 MG PO T12A
80.0000 mg | EXTENDED_RELEASE_TABLET | Freq: Once | ORAL | Status: AC
  Administered 2024-02-06: 80 mg via ORAL
  Filled 2024-02-06: qty 8

## 2024-02-06 MED ORDER — XTAMPZA ER 36 MG PO C12A: 2.0000 | EXTENDED_RELEASE_CAPSULE | Freq: Two times a day (BID) | ORAL | 0 refills | Status: DC

## 2024-02-06 NOTE — ED Triage Notes (Addendum)
 Patient C/O mid-sternal chest pain and tightness that began around 1200 this morning. Patient states that he goes to a pain clinic for chronic back and arthritic pain and was supposed to get his prescription today but wasn't able to. He believes he's in pain because he hasn't had his medicine today. Although, he does endorse having cardiac stents placed in the past. He is bradycardic in the high 40's and states that his heart rate is usually in the high 60's.

## 2024-02-06 NOTE — ED Provider Notes (Signed)
 [facility] Provider Note    Event Date/Time   First MD Initiated Contact with Patient 02/06/24 [phone]     (approximate)   History   Chest Pain   HPI  Peter Lamb is a 68 y.o. male with history of hypertension, diabetes, hyperlipidemia, CHF who presents to the emergency department with complaints of chest tightness, shortness of breath.  He thinks it is related to running out of his Xtampza  that he takes for his chronic back pain.  States that his doctor normally he prescribes 2 months of medication at a time.  He states that he ran out of his pain medication last night and thought that there was another prescription at the pharmacy and there was not.  He is followed by Dr. Myra with pain management.  He takes 2 tablets of Xtampza  which is a total of 72 mg twice a day.  He denies any new back pain, numbness, tingling, weakness, bowel or bladder incontinence.  States he has not been able to manage his pain at home which he thinks is causing him to have chest discomfort and shortness of breath.  No lower extremity swelling or discomfort.  No fever or productive cough.  No nausea, vomiting, diaphoresis or dizziness.  No history of PE or DVT.  Denies history of atrial fibrillation.  Patient states he is no longer on atenolol, Eliquis.  He states he is on lisinopril for hypertension.  Heart rate here in the 50s to 60s which appears to be his baseline.   History provided by patient.    Past Medical History:  Diagnosis Date   Arthritis    back, neck   Carpal boss of right wrist    CHF (congestive heart failure) (HCC)    Depression    Diabetes mellitus without complication (HCC)    Dysplastic nevus 10/08/2021   superior med buttocks - Moderate   GERD (gastroesophageal reflux disease)    H/O tooth extraction    Hyperlipidemia    Hypertension     Past Surgical History:  Procedure Laterality Date   APPENDECTOMY     CARPAL TUNNEL RELEASE Right 02/16/2018    Procedure: CARPAL TUNNEL RELEASE ENDOSCOPIC;  Surgeon: Edie Norleen PARAS, MD;  Location: [facility];  Service: Orthopedics;  Laterality: Right;  Diabetic - oral meds   CATARACT EXTRACTION W/PHACO Right 06/09/2021   Procedure: CATARACT EXTRACTION PHACO AND INTRAOCULAR LENS PLACEMENT (IOC) RIGHT DIABETIC;  Surgeon: Myrna Adine Anes, MD;  Location: [facility];  Service: Ophthalmology;  Laterality: Right;  4.04 00:29.6   CATARACT EXTRACTION W/PHACO Left 07/07/2021   Procedure: CATARACT EXTRACTION PHACO AND INTRAOCULAR LENS PLACEMENT (IOC) LEFT DIABETIC;  Surgeon: Myrna Adine Anes, MD;  Location: [facility];  Service: Ophthalmology;  Laterality: Left;  1.75 00:20.9   CORONARY ANGIOPLASTY  2010   no stent - [facility]   CORONARY ANGIOPLASTY WITH STENT PLACEMENT  1996 /2003    MEDICATIONS:  Prior to Admission medications   Medication Sig Start Date End Date Taking? Authorizing Provider  apixaban (ELIQUIS) 5 MG TABS tablet TAKE ONE TABLET BY MOUTH TWICE A DAY (CAUTION: BLOOD THINNER) Patient not taking: Reported on 06/16/2021 09/06/20   [provider]  aspirin 81 MG tablet Take 162 mg by mouth daily.    [provider]  atenolol (TENORMIN) 100 MG tablet Take 50 mg by mouth 2 (two) times daily.    [provider]  diltiazem (CARDIZEM CD) 240 MG 24 hr capsule Take  240 mg by mouth daily.    [provider]  glipiZIDE (GLUCOTROL) 10 MG tablet Take 10 mg by mouth 2 (two) times daily. 03/22/17   [provider]  glucosamine-chondroitin 500-400 MG tablet Take 1 tablet by mouth daily.    [provider]  lisinopril (ZESTRIL) 10 MG tablet Take 10 mg by mouth 2 (two) times daily.    [provider]  Melatonin 10 MG TABS Take by mouth at bedtime as needed.    [provider]  metFORMIN (GLUCOPHAGE) 500 MG tablet Take 1,000 mg by mouth 2 (two) times daily with a meal. Reported on 02/10/2016    [provider]   Multiple Vitamins-Minerals (VITAMINS TO GO MEN PO) Take 1 tablet by mouth daily.     [provider]  nitroGLYCERIN (NITROSTAT) 0.4 MG SL tablet  12/26/09   [provider]  Omega-3 Fatty Acids (FISH OIL) 1000 MG CAPS Take by mouth daily.    [provider]  rosuvastatin (CRESTOR) 10 MG tablet Take 10 mg by mouth daily.    [provider]  tadalafil (CIALIS) 20 MG tablet Take 20 mg by mouth daily as needed. 08/01/19   [provider]  tiZANidine  (ZANAFLEX ) 4 MG tablet Take 1 tablet (4 mg total) by mouth 2 (two) times daily. 09/21/23 03/19/24  Myra Lynwood MATSU, MD  TraZODone HCl 150 MG TB24 Take by mouth at bedtime.    [provider]  VITAMIN E PO Take 90 Units by mouth daily.    [provider]    Physical Exam   Triage Vital Signs: ED Triage Vitals  Encounter Vitals Group     BP 02/06/24 0224 132/80     Girls Systolic BP Percentile --      Girls Diastolic BP Percentile --      Boys Systolic BP Percentile --      Boys Diastolic BP Percentile --      Pulse Rate 02/06/24 0224 (!) 46     Resp 02/06/24 0224 18     Temp 02/06/24 0224 97.9 F (36.6 C)     Temp Source 02/06/24 0224 Oral     SpO2 02/06/24 0224 97 %     Weight --      Height --      Head Circumference --      Peak Flow --      Pain Score 02/06/24 0225 9     Pain Loc --      Pain Education --      Exclude from Growth Chart --     Most recent vital signs: Vitals:   02/06/24 0500 02/06/24 0530  BP: (!) 156/80 (!) 157/78  Pulse: (!) 55 61  Resp: 17 20  Temp:  97.9 F (36.6 C)  SpO2: 100% 98%    CONSTITUTIONAL: Alert, responds appropriately to questions. Well-appearing; well-nourished, elderly, pleasant, sitting upright on the side of the bed which is his position of comfort HEAD: Normocephalic, atraumatic EYES: Conjunctivae clear, pupils appear equal, sclera nonicteric ENT: normal nose; moist mucous membranes NECK: Supple, normal ROM CARD: RRR; S1 and S2  appreciated RESP: Normal chest excursion without splinting or tachypnea; breath sounds clear and equal bilaterally; no wheezes, no rhonchi, no rales, no hypoxia or respiratory distress, speaking full sentences ABD/GI: Non-distended; soft, non-tender, no rebound, no guarding, no peritoneal signs BACK: The back appears normal, no midline spinal tenderness or step-off or deformity EXT: Normal ROM in all joints; no deformity noted, no  edema SKIN: Normal color for age and race; warm; no rash on exposed skin NEURO: Moves all extremities equally, normal speech, ambulates with normal gait PSYCH: The patient's mood and manner are appropriate.   ED Results / Procedures / Treatments   LABS: (all labs ordered are listed, but only abnormal results are displayed) Labs Reviewed  BASIC METABOLIC PANEL WITH GFR - Abnormal; Notable for the following components:      Result Value   Glucose, Bld 173 (*)    All other components within normal limits  CBC - Abnormal; Notable for the following components:   Platelets 145 (*)    All other components within normal limits  BRAIN NATRIURETIC PEPTIDE  MAGNESIUM  TSH  T4, FREE  TROPONIN I (HIGH SENSITIVITY)  TROPONIN I (HIGH SENSITIVITY)     EKG:  EKG Interpretation Date/Time:  Sunday February 06 2024 02:17:45 EDT Ventricular Rate:  46 PR Interval:  178 QRS Duration:  84 QT Interval:  454 QTC Calculation: 397 R Axis:   -41  Text Interpretation: Sinus bradycardia with sinus arrhythmia Left axis deviation Nonspecific T wave abnormality Abnormal ECG When compared with ECG of 09-Jan-2023 10:21, No significant change was found Confirmed by Neomi Neptune [phone]) on 02/06/2024 3:21:13 AM         RADIOLOGY: My personal review and interpretation of imaging: Chest x-ray clear.  I have personally reviewed all radiology reports.   DG Chest 2 View Result Date: 02/06/2024 CLINICAL DATA:  Chest pain EXAM: CHEST - 2 VIEW COMPARISON:  01/09/2023 FINDINGS: Cardiac  shadow is stable. Aortic calcifications are seen. The lungs are well aerated bilaterally. No focal infiltrate or effusion is seen. No bony abnormality is noted. IMPRESSION: No active cardiopulmonary disease. Electronically Signed   By: Oneil Devonshire M.D.   On: 02/06/2024 02:52     PROCEDURES:  Critical Care performed: No     .1-3 Lead EKG Interpretation  Performed by: Barnaby Rippeon, Neptune SAILOR, DO Authorized by: Breelyn Icard, Neptune SAILOR, DO     Interpretation: abnormal     ECG rate:  55   ECG rate assessment: bradycardic     Rhythm: sinus bradycardia     Ectopy: none     Conduction: normal       IMPRESSION / MDM / ASSESSMENT AND PLAN / ED COURSE  I reviewed the triage vital signs and the nursing notes.    Patient here with chest tightness, shortness of breath.  He thinks this is related to running out of his chronic pain medication yesterday.  The patient is on the cardiac monitor to evaluate for evidence of arrhythmia and/or significant heart rate changes.   DIFFERENTIAL DIAGNOSIS (includes but not limited to):   ACS, CHF, opiate withdrawal, doubt PE, dissection, pneumonia, pneumothorax     Patient's presentation is most consistent with acute presentation with potential threat to life or bodily function.   PLAN: Will obtain cardiac labs.  Patient is intermittently bradycardic however on review of records this appears chronic.  He is not on a beta-blocker and he denies feeling lightheaded, dizzy.  Will check electrolytes, thyroid  function and monitor closely.  Heart rate on my evaluation is currently in the 50s with hypertension.  Will also give him a dose of pain medication here equivalent to what he takes at home as we do not have Xtampza  on formulary.  Discussed with [place] with pharmacy who recommends OxyContin  80 mg.   MEDICATIONS GIVEN IN ED: Medications  oxyCODONE  (OXYCONTIN ) 12 hr tablet 80 mg (80  mg Oral Given 02/06/24 0349)     ED COURSE: Patient's globin is normal.  Normal  electrolytes.  Normal thyroid  function studies.  Troponin x 2 negative.  Chest x-ray reviewed and interpreted by myself and the radiologist and is clear.  BNP also normal.  Patient reports feeling much better.  Heart rate currently in the low 60s.  No evidence seen on cardiac monitoring.  Will provide him with a prescription for 1 day of his Xtampza  to get him through the rest of this weekend and he will call his pain management doctor first thing Monday morning.  Recommended outpatient follow-up.  Discussed return precautions.   At this time, I do not feel there is any life-threatening condition present. I reviewed all nursing notes, vitals, pertinent previous records.  All lab and urine results, EKGs, imaging ordered have been independently reviewed and interpreted by myself.  I reviewed all available radiology reports from any imaging ordered this visit.  Based on my assessment, I feel the patient is safe to be discharged home without further emergent workup and can continue workup as an outpatient as needed. Discussed all findings, treatment plan as well as usual and customary return precautions.  They verbalize understanding and are comfortable with this plan.  Outpatient follow-up has been provided as needed.  All questions have been answered.   CONSULTS:  none   OUTSIDE RECORDS REVIEWED: Reviewed last pain management note on 11/30/2023 and last PCP note on 12/22/2023.       FINAL CLINICAL IMPRESSION(S) / ED DIAGNOSES   Final diagnoses:  Nonspecific chest pain  Opiate withdrawal (HCC)     Rx / DC Orders   ED Discharge Orders          Ordered    oxyCODONE  ER (XTAMPZA  ER) 36 MG C12A  2 times daily        02/06/24 0508             Note:  This document was prepared using Dragon voice recognition software and may include unintentional dictation errors.   Damonta Cossey, Josette SAILOR, DO 02/06/24 [phone]

## 2024-02-06 NOTE — ED Notes (Signed)
 Fall risk bundle is currently in place.

## 2024-02-06 NOTE — ED Notes (Signed)
 Pt states he is a pt at pain clinic, he thought his refill would be there today but it isn't. States he thinks his chest tightness is coming from his back pain. Pt rates his chest tightness 4/10 on pain scale & back pain 9/10. Pt states he can not sit in the bed comfortably due to his back pain. Pt insisted on side rail being down. Pt instructed on the need for cardiac monitoring at this time. Pt verbalizes understanding.

## 2024-02-07 ENCOUNTER — Encounter: Payer: Self-pay | Admitting: Nurse Practitioner

## 2024-02-07 ENCOUNTER — Telehealth: Payer: Self-pay | Admitting: Nurse Practitioner

## 2024-02-07 ENCOUNTER — Ambulatory Visit: Attending: Nurse Practitioner | Admitting: Nurse Practitioner

## 2024-02-07 VITALS — BP 173/90 | HR 50 | Temp 98.3°F | Resp 16 | Ht 70.0 in | Wt 225.0 lb

## 2024-02-07 DIAGNOSIS — M5136 Other intervertebral disc degeneration, lumbar region with discogenic back pain only: Secondary | ICD-10-CM | POA: Diagnosis present

## 2024-02-07 DIAGNOSIS — M4716 Other spondylosis with myelopathy, lumbar region: Secondary | ICD-10-CM | POA: Diagnosis present

## 2024-02-07 DIAGNOSIS — M7918 Myalgia, other site: Secondary | ICD-10-CM | POA: Insufficient documentation

## 2024-02-07 DIAGNOSIS — G894 Chronic pain syndrome: Secondary | ICD-10-CM | POA: Insufficient documentation

## 2024-02-07 DIAGNOSIS — M792 Neuralgia and neuritis, unspecified: Secondary | ICD-10-CM | POA: Diagnosis present

## 2024-02-07 DIAGNOSIS — M47816 Spondylosis without myelopathy or radiculopathy, lumbar region: Secondary | ICD-10-CM | POA: Insufficient documentation

## 2024-02-07 DIAGNOSIS — Z79891 Long term (current) use of opiate analgesic: Secondary | ICD-10-CM | POA: Diagnosis present

## 2024-02-07 MED ORDER — XTAMPZA ER 36 MG PO C12A: 2.0000 | EXTENDED_RELEASE_CAPSULE | Freq: Two times a day (BID) | ORAL | 0 refills | Status: DC | PRN

## 2024-02-07 MED ORDER — XTAMPZA ER 36 MG PO C12A: 2.0000 | EXTENDED_RELEASE_CAPSULE | Freq: Two times a day (BID) | ORAL | 0 refills | Status: DC

## 2024-02-07 MED ORDER — XTAMPZA ER 36 MG PO C12A
2.0000 | EXTENDED_RELEASE_CAPSULE | Freq: Two times a day (BID) | ORAL | 0 refills | Status: DC | PRN
Start: 2024-02-07 — End: 2024-03-06

## 2024-02-07 NOTE — Progress Notes (Signed)
 PROVIDER NOTE: Interpretation of information contained herein should be left to medically-trained personnel. Specific patient instructions are provided elsewhere under Patient Instructions section of medical record. This document was created in part using AI and STT-dictation technology, any transcriptional errors that may result from this process are unintentional.  Patient: Peter Lamb  Service: E/M   PCP: Valora Agent, MD  DOB: May 24, 1956  DOS: 02/07/2024  Provider: Emmy MARLA Blanch, NP  MRN: [id number]  Delivery: Face-to-face  Specialty: Interventional Pain Management  Type: Established Patient  Setting: Ambulatory outpatient facility  Specialty designation: 09  Referring Prov.: Valora Agent, MD  Location: Outpatient office facility       History of present illness (HPI) Mr. Peter Lamb, a 68 y.o. year old male, is here today because of his Degeneration of intervertebral disc of lumbar region with discogenic back pain [M51.360]. Mr. Grandt primary complain today is Back Pain   Pain Assessment: Severity of Chronic pain is reported as a 8 /10. Location: Back Lower, Left/Radiates down left leg. Onset: More than a month ago. Quality: Constant, Sharp. Timing: Constant. Modifying factor(s): Medication, excercise like walking. Vitals:  height is 5' 10 (1.778 m) and weight is 225 lb (102.1 kg). His temporal temperature is 98.3 F (36.8 C). His blood pressure is 173/90 (abnormal) and his pulse is 50 (abnormal). His respiration is 16 and oxygen saturation is 98%.  BMI: Estimated body mass index is 32.28 kg/m as calculated from the following:   Height as of this encounter: 5' 10 (1.778 m).   Weight as of this encounter: 225 lb (102.1 kg).  Last encounter: 11/30/2023 Last procedure: Visit date not found.  Reason for encounter: medication management.  The patient reports experiencing low back pain, however he is doing well with Xtampza .  He takes 2 tablets twice daily, which provides  effective pain relief and support his functional mobility.  He does not report any side effects or adverse reaction to medication.  He is also engaging in regular stretching exercises, which have been helpful in strengthening his lower back and elevating sciatic symptoms.  He was admitted to the emergency department yesterday with complaint of chest tightness and shortness of breath.  He attributed the symptoms to running out of his Xtampza .  He believed there was another prescription available at the pharmacy, but there were none remaining.  He received a 1 day prescription of Xtampza  from the ED.   Pharmacotherapy Assessment   Oxycodone  ER (Xtampza  ER) 36 mg capsules, take 2 capsules by mouth 2 times daily as needed for pain. MME=240 Monitoring: [place] PMP: PDMP reviewed during this encounter.       Pharmacotherapy: No side-effects or adverse reactions reported. Compliance: No problems identified. Effectiveness: Clinically acceptable.  Peter Lamb, [place]  02/07/2024  2:26 PM  Sign when Signing Visit Nursing Pain Medication Assessment:  Safety precautions to be maintained throughout the outpatient stay will include: orient to surroundings, keep bed in low position, maintain call bell within reach at all times, provide assistance with transfer out of bed and ambulation.  Medication Inspection Compliance: Pill count conducted under aseptic conditions, in front of the patient. Neither the pills nor the bottle was removed from the patient's sight at any time. Once count was completed pills were immediately returned to the patient in their original bottle.  Medication: See above Pill/Patch Count: 0 of 120 pills/patches remain Pill/Patch Appearance: No markings Bottle Appearance: Standard pharmacy container. Clearly labeled. Filled Date: 06 / 12 / 2025 Last  Medication intake:  Today  UDS:  Summary  Date Value Ref Range Status  04/07/2023 Note  Final    Comment:     ==================================================================== ToxASSURE Select 13 (MW) ==================================================================== Specimen Alert ToxAssure, ToxAssure FLEX or MAT drug testing: -Technical component - Result certification performed at [facility], [address] [place], [place] [address]. [id number] Lab Director Arnulfo Finder, PhrmD. ==================================================================== Test                             Result       Flag       Units  Drug Present and Declared for Prescription Verification   Oxycodone                       5813         EXPECTED   ng/mg creat   Oxymorphone                    3107         EXPECTED   ng/mg creat   Noroxycodone                   10420        EXPECTED   ng/mg creat   Noroxymorphone                 2085         EXPECTED   ng/mg creat    Sources of oxycodone  are scheduled prescription medications.    Oxymorphone, noroxycodone, and noroxymorphone are expected    metabolites of oxycodone . Oxymorphone is also available as a    scheduled prescription medication.  ==================================================================== Test                      Result    Flag   Units      Ref Range   Creatinine              61               mg/dL      >=79 ==================================================================== Declared Medications:  The flagging and interpretation on this report are based on the  following declared medications.  Unexpected results may arise from  inaccuracies in the declared medications.   **Note: The testing scope of this panel includes these medications:   Oxycodone  (Xtampza )   **Note: The testing scope of this panel does not include the  following reported medications:   Apixaban (Eliquis)  Aspirin  Atenolol (Tenormin)  Diltiazem (Cardizem)  Fish Oil  Glipizide (Glucotrol)  Glucosamine  Lisinopril (Zestril)  Melatonin  Metformin   Multivitamin  Nitroglycerin (Nitrostat)  Rosuvastatin (Crestor)  Tadalafil (Cialis)  Tizanidine  (Zanaflex )  Trazodone  Vitamin E ==================================================================== For clinical consultation, please call [phone]. ====================================================================     No results found for: CBDTHCR No results found for: D8THCCBX No results found for: D9THCCBX  ROS  Constitutional: Denies any fever or chills Gastrointestinal: No reported hemesis, hematochezia, vomiting, or acute GI distress Musculoskeletal: Lower back pain Neurological: No reported episodes of acute onset apraxia, aphasia, dysarthria, agnosia, amnesia, paralysis, loss of coordination, or loss of consciousness  Medication Review  Fish Oil, Melatonin, Multiple Vitamins-Minerals, TraZODone HCl, Vitamin E, apixaban, aspirin, atenolol, diltiazem, glipiZIDE, glucosamine-chondroitin, lisinopril, metFORMIN, nitroGLYCERIN, oxyCODONE  ER, rosuvastatin, tadalafil, and tiZANidine   History Review  Allergy: Mr. Traynham is allergic to empagliflozin. Drug: Mr. Aversa  reports  no history of drug use. Alcohol:  reports no history of alcohol use. Tobacco:  reports that he quit smoking about 30 years ago. His smoking use included cigarettes. He has quit using smokeless tobacco. Social: Mr. Sparr  reports that he quit smoking about 30 years ago. His smoking use included cigarettes. He has quit using smokeless tobacco. He reports that he does not drink alcohol and does not use drugs. Medical:  has a past medical history of Arthritis, Carpal boss of right wrist, CHF (congestive heart failure) (HCC), Depression, Diabetes mellitus without complication (HCC), Dysplastic nevus (10/08/2021), GERD (gastroesophageal reflux disease), H/O tooth extraction, Hyperlipidemia, and Hypertension. Surgical: Mr. 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 Family: family history includes Alcohol abuse in his brother and father; Arthritis in his maternal grandfather and mother; Asthma in his daughter; Cancer in his maternal grandfather and mother; Depression in his brother; Diabetes in his brother, maternal grandfather, and mother; Drug abuse in his daughter; Early death in his brother; Heart disease in his brother and father; Hypertension in his brother and father; Kidney disease in his brother; Mental illness in his maternal aunt; Stroke in his maternal grandmother; Varicose Veins in his mother.  Laboratory Chemistry Profile   Renal Lab Results  Component Value Date   BUN 15 02/06/2024   CREATININE 1.02 02/06/2024   GFRAA >60 08/21/2016   GFRNONAA >60 02/06/2024    Hepatic Lab Results  Component Value Date   AST 23 03/04/2010   ALT 31 03/04/2010   ALBUMIN 4.7 03/04/2010   ALKPHOS 64 03/04/2010    Electrolytes Lab Results  Component Value Date   NA 137 02/06/2024   K 4.0 02/06/2024   CL 100 02/06/2024   CALCIUM 8.9 02/06/2024   MG 2.1 02/06/2024    Bone No results found for: VD25OH, VD125OH2TOT, CI6874NY7, CI7874NY7, 25OHVITD1, 25OHVITD2, 25OHVITD3, TESTOFREE, TESTOSTERONE  Inflammation (CRP: Acute Phase) (ESR: Chronic Phase) No results found for: CRP, ESRSEDRATE, LATICACIDVEN       Note: Above Lab results reviewed.  Recent Imaging Review  DG Chest 2 View CLINICAL DATA:  Chest pain  EXAM: CHEST - 2 VIEW  COMPARISON:  01/09/2023  FINDINGS: Cardiac shadow is stable. Aortic calcifications are seen. The lungs are well aerated bilaterally. No focal infiltrate or effusion is seen. No bony abnormality is noted.  IMPRESSION: No active cardiopulmonary disease.  Electronically Signed   By: Oneil Devonshire M.D.   On: 02/06/2024 02:52 Note: Reviewed        Physical Exam  Vitals: BP (!) 173/90 (BP Location: Right Arm, Patient Position: Sitting, Cuff Size: Normal)   Pulse (!) 50   Temp 98.3 F (36.8 C) (Temporal)   Resp 16   Ht 5' 10 (1.778 m)   Wt 225 lb (102.1 kg)   SpO2 98%   BMI 32.28 kg/m  BMI: Estimated body mass index is 32.28 kg/m as calculated from the following:   Height as of this encounter: 5' 10 (1.778 m).   Weight as of this encounter: 225 lb (102.1 kg). Ideal: Ideal body weight: 73 kg (160 lb 15 oz) Adjusted ideal body weight: 84.6 kg (186 lb 9 oz) General appearance: Well nourished, well developed, and well hydrated. In no apparent acute distress Mental status: Alert, oriented x 3 (person, place, & time)  Respiratory: No evidence of acute respiratory distress Eyes: PERLA   Assessment   Diagnosis Status  1. Degeneration of intervertebral disc of lumbar region with discogenic back pain   2. Lumbar spondylosis with myelopathy   3. Facet arthritis of lumbar region   4. Musculoskeletal pain   5. Chronic pain syndrome   6. Long term current use of opiate analgesic   7. Neurogenic pain    Controlled Controlled Controlled   Updated Problems: No problems updated.  Plan of Care  Problem-specific:  Assessment and Plan Will continue on current medication regimen.  Prescribing drug monitoring (PDMP) reviewed; findings consistent with the use of prescribed medication and no evidence of narcotic misuse or abuse.  Schedule follow-up in 30 days for medication management with Dr. Myra.  No other new issues or problems reported to this visit.  Mr. VAUGHAN GARFINKLE has a current medication list which includes the following long-term medication(s): metformin, nitroglycerin, rosuvastatin, tadalafil, tizanidine , trazodone hcl, apixaban, atenolol, diltiazem, and glipizide.  Pharmacotherapy (Medications Ordered): Meds ordered this encounter  Medications    oxyCODONE  ER (XTAMPZA  ER) 36 MG C12A    Sig: Take 2 capsules (72 mg total) by mouth in the morning and at bedtime. Must last 30 days.    Dispense:  120 capsule    Refill:  0   Orders:  No orders of the defined types were placed in this encounter.     Return in about 1 month (around 03/09/2024) for (F2F), (MM) with Dr. Myra.    Recent Visits Date Type Provider Dept  11/30/23 Office Visit Myra Lynwood MATSU, MD [facility]  Showing recent visits within past 90 days and meeting all other requirements Today's Visits Date Type Provider Dept  02/07/24 Office Visit Tavaughn Silguero K, NP [facility]  Showing today's visits and meeting all other requirements Future Appointments No visits were found meeting these conditions. Showing future appointments within next 90 days and meeting all other requirements  I discussed the assessment and treatment plan with the patient. The patient was provided an opportunity to ask questions and all were answered. The patient agreed with the plan and demonstrated an understanding of the instructions.  Patient advised to call back or seek an in-person evaluation if the symptoms or condition worsens.  Duration of encounter: 30 minutes.  Total time on encounter, as per AMA guidelines included both the face-to-face and non-face-to-face time personally spent by the physician and/or other qualified health care professional(s) on the day of the encounter (includes time in activities that require the physician or other qualified health care professional and does not include time in activities normally performed by clinical staff). Physician's time may include the following activities when performed: Preparing to see the patient (e.g., pre-charting review of records, searching for previously ordered imaging, lab work, and nerve conduction tests) Review of prior analgesic pharmacotherapies. Reviewing PMP Interpreting ordered tests (e.g., lab work, imaging,  nerve conduction tests) Performing post-procedure evaluations, including interpretation of diagnostic procedures Obtaining and/or reviewing separately obtained history Performing a medically appropriate examination and/or evaluation Counseling and educating the patient/family/caregiver Ordering medications, tests, or procedures Referring and communicating with other health care professionals (when not separately reported) Documenting clinical information in the electronic or other health record Independently interpreting results (not separately reported) and communicating results to the patient/ family/caregiver Care coordination (not separately reported)  Note by: Asjah Rauda K Taneah Masri, NP (TTS and AI technology used. I apologize for any typographical errors that were not detected and  corrected.) Date: 02/07/2024; Time: 2:53 PM

## 2024-02-07 NOTE — Telephone Encounter (Signed)
 PT called stated he spoke with pharmacy and was told that the prescription for oxycodone  was send in a different way, from the previous time. Please give patient and pharmacy a call. TY

## 2024-02-07 NOTE — Addendum Note (Signed)
 Addended by: TOBIE RICHMOND on: 02/07/2024 03:55 PM   Modules accepted: Orders

## 2024-02-07 NOTE — Progress Notes (Signed)
 Nursing Pain Medication Assessment:  Safety precautions to be maintained throughout the outpatient stay will include: orient to surroundings, keep bed in low position, maintain call bell within reach at all times, provide assistance with transfer out of bed and ambulation.  Medication Inspection Compliance: Pill count conducted under aseptic conditions, in front of the patient. Neither the pills nor the bottle was removed from the patient's sight at any time. Once count was completed pills were immediately returned to the patient in their original bottle.  Medication: See above Pill/Patch Count: 0 of 120 pills/patches remain Pill/Patch Appearance: No markings Bottle Appearance: Standard pharmacy container. Clearly labeled. Filled Date: 06 / 12 / 2025 Last Medication intake:  Today

## 2024-02-07 NOTE — Telephone Encounter (Signed)
 Patient called with no answer. LVM with patient that I had called pharmacy and they state that it is being rejected by his insurance. I informed Seema and she states she will try and redo the script and see if insurance will accept.

## 2024-02-07 NOTE — Addendum Note (Signed)
 Addended by: TOBIE RICHMOND on: 02/07/2024 03:48 PM   Modules accepted: Orders

## 2024-02-07 NOTE — Patient Instructions (Signed)

## 2024-02-08 ENCOUNTER — Telehealth: Payer: Self-pay

## 2024-02-09 ENCOUNTER — Telehealth: Payer: Self-pay

## 2024-02-09 NOTE — Telephone Encounter (Signed)
 PA competed in [facility] for Xtampza 

## 2024-02-09 NOTE — Telephone Encounter (Signed)
 PA for Xtampza  ER 36mg  started via [facility].   Outcome Additional Information Required This medication or product is on your plan's list of covered drugs. Prior authorization is not required at this time. If your pharmacy has questions regarding the processing of your prescription or if the request is for Prospective Milligram Morphine Equivalent (MME) review, please call the [facility] pharmacy help desk at [phone] for further assistance.

## 2024-02-19 ENCOUNTER — Other Ambulatory Visit: Payer: Self-pay | Admitting: Anesthesiology

## 2024-03-06 ENCOUNTER — Encounter: Payer: Self-pay | Admitting: Anesthesiology

## 2024-03-06 ENCOUNTER — Ambulatory Visit: Attending: Anesthesiology | Admitting: Anesthesiology

## 2024-03-06 VITALS — BP 160/80 | HR 65 | Temp 97.7°F | Resp 16 | Ht 70.0 in | Wt 214.0 lb

## 2024-03-06 DIAGNOSIS — G894 Chronic pain syndrome: Secondary | ICD-10-CM | POA: Diagnosis present

## 2024-03-06 DIAGNOSIS — M5136 Other intervertebral disc degeneration, lumbar region with discogenic back pain only: Secondary | ICD-10-CM | POA: Insufficient documentation

## 2024-03-06 DIAGNOSIS — Z79891 Long term (current) use of opiate analgesic: Secondary | ICD-10-CM | POA: Insufficient documentation

## 2024-03-06 DIAGNOSIS — M7918 Myalgia, other site: Secondary | ICD-10-CM | POA: Insufficient documentation

## 2024-03-06 DIAGNOSIS — M47816 Spondylosis without myelopathy or radiculopathy, lumbar region: Secondary | ICD-10-CM | POA: Insufficient documentation

## 2024-03-06 DIAGNOSIS — M792 Neuralgia and neuritis, unspecified: Secondary | ICD-10-CM | POA: Insufficient documentation

## 2024-03-06 DIAGNOSIS — M4716 Other spondylosis with myelopathy, lumbar region: Secondary | ICD-10-CM | POA: Diagnosis not present

## 2024-03-06 DIAGNOSIS — M5387 Other specified dorsopathies, lumbosacral region: Secondary | ICD-10-CM | POA: Insufficient documentation

## 2024-03-06 MED ORDER — XTAMPZA ER 36 MG PO C12A: 2.0000 | EXTENDED_RELEASE_CAPSULE | Freq: Two times a day (BID) | ORAL | 0 refills | Status: DC

## 2024-03-06 MED ORDER — XTAMPZA ER 36 MG PO C12A: 2.0000 | EXTENDED_RELEASE_CAPSULE | Freq: Two times a day (BID) | ORAL | 0 refills | Status: AC | PRN

## 2024-03-06 NOTE — Progress Notes (Signed)
 Subjective:  Patient ID: Peter Lamb, male    DOB: 23-Jul-1956  Age: 68 y.o. MRN: [id number]  CC: Back Pain   Procedure: None  HPI [name] presents for reevaluation.  He feels that he is doing well in regards to control of his low back pain and lower leg pain.  He does have some intermittent sciatica symptoms generally worse with prolonged walking or certain activity but these are generally well-controlled at this time.  In the past has had epidural steroid injections to help with the sciatic symptoms and these have been very effective for him and he does not feel that he needs to pursue that at this time and is hopeful that conservative measures will Allee some of that symptom.  He takes his chronic opioid therapy secondary to the prolonged back pain and osteoarthritis and these continue to work well for him.  He generally reports about 70% relief whereas he has failed conservative therapy.  This generally lasts for about 6 hours before his recurrence of his baseline pain.  No side effects with his medications are noted.  The quality characteristic and distribution of his low back pain and sciatica symptoms are stable in nature.  Outpatient Medications Prior to Visit  Medication Sig Dispense Refill   aspirin 81 MG tablet Take 162 mg by mouth daily.     glipiZIDE (GLUCOTROL XL) 5 MG 24 hr tablet Take 5 mg by mouth.     glucosamine-chondroitin 500-400 MG tablet Take 1 tablet by mouth daily.     lisinopril (ZESTRIL) 10 MG tablet Take 10 mg by mouth 2 (two) times daily.     Melatonin 10 MG TABS Take by mouth at bedtime as needed.     Multiple Vitamins-Minerals (VITAMINS TO GO MEN PO) Take 1 tablet by mouth daily.      nitroGLYCERIN (NITROSTAT) 0.4 MG SL tablet      Omega-3 Fatty Acids (FISH OIL) 1000 MG CAPS Take by mouth daily.     rosuvastatin (CRESTOR) 10 MG tablet Take 10 mg by mouth daily.     tadalafil (CIALIS) 20 MG tablet Take 20 mg by mouth daily as needed.     tiZANidine   (ZANAFLEX ) 4 MG tablet Take 1 tablet (4 mg total) by mouth 2 (two) times daily. 60 tablet 5   TraZODone HCl 150 MG TB24 Take by mouth at bedtime.     VITAMIN E PO Take 90 Units by mouth daily.     oxyCODONE  ER (XTAMPZA  ER) 36 MG C12A Take 2 capsules (72 mg total) by mouth 2 (two) times daily as needed. 120 capsule 0   apixaban (ELIQUIS) 5 MG TABS tablet TAKE ONE TABLET BY MOUTH TWICE A DAY (CAUTION: BLOOD THINNER) (Patient not taking: Reported on 03/06/2024)     metFORMIN (GLUCOPHAGE) 500 MG tablet Take 1,000 mg by mouth 2 (two) times daily with a meal. Reported on 02/10/2016 (Patient not taking: Reported on 03/06/2024)     No facility-administered medications prior to visit.    Review of Systems CNS: No confusion or sedation Cardiac: No angina or palpitations GI: No abdominal pain or constipation Constitutional: No nausea vomiting fevers or chills  Objective:  BP (!) 160/80   Pulse 65   Temp 97.7 F (36.5 C)   Resp 16   Ht 5' 10 (1.778 m)   Wt 214 lb (97.1 kg)   SpO2 96%   BMI 30.71 kg/m    BP Readings from Last 3 Encounters:  03/06/24 ROLLEN)  160/80  02/07/24 (!) 173/90  02/06/24 (!) 157/78     Wt Readings from Last 3 Encounters:  03/06/24 214 lb (97.1 kg)  02/07/24 225 lb (102.1 kg)  09/21/23 225 lb (102.1 kg)     Physical Exam Pt is alert and oriented PERRL EOMI HEART IS RRR no murmur or rub LCTA no wheezing or rales MUSCULOSKELETAL has some paraspinous muscle tenderness in the lumbar region but no overt trigger points.  His strength appears to be good he walks with a mildly antalgic gait but has been able to maintain his lower extremity muscle tone and bulk.  Labs  Lab Results  Component Value Date   HGBA1C 9.0 03/04/2010   Lab Results  Component Value Date   CREATININE 1.02 02/06/2024    -------------------------------------------------------------------------------------------------------------------- Lab Results  Component Value Date   WBC 6.2  02/06/2024   HGB 14.3 02/06/2024   HCT 40.1 02/06/2024   PLT 145 (L) 02/06/2024   GLUCOSE 173 (H) 02/06/2024   ALT 31 03/04/2010   AST 23 03/04/2010   NA 137 02/06/2024   K 4.0 02/06/2024   CL 100 02/06/2024   CREATININE 1.02 02/06/2024   BUN 15 02/06/2024   CO2 26 02/06/2024   TSH 0.995 02/06/2024   PSA 0.26 03/04/2010   HGBA1C 9.0 03/04/2010    --------------------------------------------------------------------------------------------------------------------- DG Chest 2 View Result Date: 02/06/2024 CLINICAL DATA:  Chest pain EXAM: CHEST - 2 VIEW COMPARISON:  01/09/2023 FINDINGS: Cardiac shadow is stable. Aortic calcifications are seen. The lungs are well aerated bilaterally. No focal infiltrate or effusion is seen. No bony abnormality is noted. IMPRESSION: No active cardiopulmonary disease. Electronically Signed   By: Oneil Devonshire M.D.   On: 02/06/2024 02:52     Assessment & Plan:   Farid was seen today for back pain.  Diagnoses and all orders for this visit:  Degeneration of intervertebral disc of lumbar region with discogenic back pain -     ToxASSURE Select 13 (MW), Urine  Lumbar spondylosis with myelopathy -     ToxASSURE Select 13 (MW), Urine  Facet arthritis of lumbar region -     ToxASSURE Select 13 (MW), Urine  Musculoskeletal pain -     ToxASSURE Select 13 (MW), Urine  Chronic pain syndrome -     ToxASSURE Select 13 (MW), Urine  Long term current use of opiate analgesic -     ToxASSURE Select 13 (MW), Urine  Neurogenic pain -     ToxASSURE Select 13 (MW), Urine  Sciatica of left side associated with disorder of lumbosacral spine -     ToxASSURE Select 13 (MW), Urine  Spondylosis of lumbar region without myelopathy or radiculopathy -     ToxASSURE Select 13 (MW), Urine  Other orders -     oxyCODONE  ER (XTAMPZA  ER) 36 MG C12A; Take 2 capsules (72 mg total) by mouth 2 (two) times daily as needed. -     oxyCODONE  ER (XTAMPZA  ER) 36 MG C12A; Take 2  capsules (72 mg total) by mouth 2 (two) times daily.        ----------------------------------------------------------------------------------------------------------------------  Problem List Items Addressed This Visit       Unprioritized   Chronic pain (Chronic)   Relevant Medications   oxyCODONE  ER (XTAMPZA  ER) 36 MG C12A (Start on 03/09/2024)   oxyCODONE  ER (XTAMPZA  ER) 36 MG C12A (Start on 04/08/2024)   Other Relevant Orders   ToxASSURE Select 13 (MW), Urine   Long term current use of opiate analgesic (Chronic)  Relevant Orders   ToxASSURE Select 13 (MW), Urine   Musculoskeletal pain (Chronic)   Relevant Orders   ToxASSURE Select 13 (MW), Urine   Neurogenic pain (Chronic)   Relevant Orders   ToxASSURE Select 13 (MW), Urine   DDD (degenerative disc disease), lumbar - Primary   Relevant Orders   ToxASSURE Select 13 (MW), Urine   Sciatica of left side associated with disorder of lumbosacral spine   Relevant Orders   ToxASSURE Select 13 (MW), Urine   Spondylosis of lumbar region without myelopathy or radiculopathy   Relevant Orders   ToxASSURE Select 13 (MW), Urine   Other Visit Diagnoses       Lumbar spondylosis with myelopathy       Relevant Orders   ToxASSURE Select 13 (MW), Urine     Facet arthritis of lumbar region       Relevant Medications   oxyCODONE  ER (XTAMPZA  ER) 36 MG C12A (Start on 03/09/2024)   oxyCODONE  ER (XTAMPZA  ER) 36 MG C12A (Start on 04/08/2024)   Other Relevant Orders   ToxASSURE Select 13 (MW), Urine         ----------------------------------------------------------------------------------------------------------------------  1. Degeneration of intervertebral disc of lumbar region with discogenic back pain (Primary) At this point we will defer on any further interventional therapy.  However, he has done very well with epidural steroid periodically for control of the sciatica symptoms and low back pain. - ToxASSURE Select 13 (MW),  Urine  2. Lumbar spondylosis with myelopathy Continue core stretching strengthening exercises as discussed - ToxASSURE Select 13 (MW), Urine  3. Facet arthritis of lumbar region As above - ToxASSURE Select 13 (MW), Urine  4. Musculoskeletal pain As above - ToxASSURE Select 13 (MW), Urine  5. Chronic pain syndrome I have reviewed the [place]  practitioner database information as appropriate for refills.  His pain has been quite recalcitrant to treat but he has responded to chronic opioid therapy which he has been on for many years.  Will refill his medicines for August 14 and September 13 with no other changes today. - ToxASSURE Select 13 (MW), Urine  6. Long term current use of opiate analgesic As above - ToxASSURE Select 13 (MW), Urine  7. Neurogenic pain As above - ToxASSURE Select 13 (MW), Urine  8. Sciatica of left side associated with disorder of lumbosacral spine  - ToxASSURE Select 13 (MW), Urine  9. Spondylosis of lumbar region without myelopathy or radiculopathy  - ToxASSURE Select 13 (MW), Urine    ----------------------------------------------------------------------------------------------------------------------  I am having Hosteen N. Siegrist start on Xtampza  ER. I am also having him maintain his aspirin, metFORMIN, Multiple Vitamins-Minerals (VITAMINS TO GO MEN PO), rosuvastatin, TraZODone HCl, nitroGLYCERIN, Fish Oil, VITAMIN E PO, Melatonin, glucosamine-chondroitin, tadalafil, apixaban, lisinopril, tiZANidine , glipiZIDE, and Xtampza  ER.   Meds ordered this encounter  Medications   oxyCODONE  ER (XTAMPZA  ER) 36 MG C12A    Sig: Take 2 capsules (72 mg total) by mouth 2 (two) times daily as needed.    Dispense:  120 capsule    Refill:  0   oxyCODONE  ER (XTAMPZA  ER) 36 MG C12A    Sig: Take 2 capsules (72 mg total) by mouth 2 (two) times daily.    Dispense:  120 capsule    Refill:  0   Patient's Medications  New Prescriptions   OXYCODONE  ER (XTAMPZA   ER) 36 MG C12A    Take 2 capsules (72 mg total) by mouth 2 (two) times daily.  Previous Medications   APIXABAN (  ELIQUIS) 5 MG TABS TABLET    TAKE ONE TABLET BY MOUTH TWICE A DAY (CAUTION: BLOOD THINNER)   ASPIRIN 81 MG TABLET    Take 162 mg by mouth daily.   GLIPIZIDE (GLUCOTROL XL) 5 MG 24 HR TABLET    Take 5 mg by mouth.   GLUCOSAMINE-CHONDROITIN 500-400 MG TABLET    Take 1 tablet by mouth daily.   LISINOPRIL (ZESTRIL) 10 MG TABLET    Take 10 mg by mouth 2 (two) times daily.   MELATONIN 10 MG TABS    Take by mouth at bedtime as needed.   METFORMIN (GLUCOPHAGE) 500 MG TABLET    Take 1,000 mg by mouth 2 (two) times daily with a meal. Reported on 02/10/2016   MULTIPLE VITAMINS-MINERALS (VITAMINS TO GO MEN PO)    Take 1 tablet by mouth daily.    NITROGLYCERIN (NITROSTAT) 0.4 MG SL TABLET       OMEGA-3 FATTY ACIDS (FISH OIL) 1000 MG CAPS    Take by mouth daily.   ROSUVASTATIN (CRESTOR) 10 MG TABLET    Take 10 mg by mouth daily.   TADALAFIL (CIALIS) 20 MG TABLET    Take 20 mg by mouth daily as needed.   TIZANIDINE  (ZANAFLEX ) 4 MG TABLET    Take 1 tablet (4 mg total) by mouth 2 (two) times daily.   TRAZODONE HCL 150 MG TB24    Take by mouth at bedtime.   VITAMIN E PO    Take 90 Units by mouth daily.  Modified Medications   Modified Medication Previous Medication   OXYCODONE  ER (XTAMPZA  ER) 36 MG C12A oxyCODONE  ER (XTAMPZA  ER) 36 MG C12A      Take 2 capsules (72 mg total) by mouth 2 (two) times daily as needed.    Take 2 capsules (72 mg total) by mouth 2 (two) times daily as needed.  Discontinued Medications   No medications on file   ----------------------------------------------------------------------------------------------------------------------  Follow-up: Return in about 2 months (around 05/06/2024) for evaluation, med refill.  Continue follow-up with his primary care physician for baseline medical care  Lynwood KANDICE Clause, MD

## 2024-03-06 NOTE — Progress Notes (Signed)
 Nursing Pain Medication Assessment:  Safety precautions to be maintained throughout the outpatient stay will include: orient to surroundings, keep bed in low position, maintain call bell within reach at all times, provide assistance with transfer out of bed and ambulation.  Medication Inspection Compliance: Pill count conducted under aseptic conditions, in front of the patient. Neither the pills nor the bottle was removed from the patient's sight at any time. Once count was completed pills were immediately returned to the patient in their original bottle.  Medication: xtampsa Pill/Patch Count: 10 of 120 pills/patches remain Pill/Patch Appearance: Markings consistent with prescribed medication Bottle Appearance: Standard pharmacy container. Clearly labeled. Filled Date: 07 / 15 / 2025 Last Medication intake:  Today

## 2024-03-08 LAB — TOXASSURE SELECT 13 (MW), URINE

## 2024-03-22 ENCOUNTER — Telehealth: Payer: Self-pay

## 2024-03-22 ENCOUNTER — Other Ambulatory Visit: Payer: Self-pay

## 2024-03-22 NOTE — Telephone Encounter (Signed)
 He needs refill on his Trizanadine

## 2024-03-22 NOTE — Telephone Encounter (Signed)
 Refill request sent to dr Pernell Dupre

## 2024-03-24 ENCOUNTER — Telehealth: Payer: Self-pay

## 2024-03-24 ENCOUNTER — Other Ambulatory Visit: Payer: Self-pay

## 2024-03-24 ENCOUNTER — Telehealth: Payer: Self-pay | Admitting: Anesthesiology

## 2024-03-24 NOTE — Telephone Encounter (Signed)
 PT called stated that he had call three ago to get refill on tizanidine . PT stated that he call pharmacy and was informed that a refill hadn't been send in. Please give patient a call. TY

## 2024-03-24 NOTE — Telephone Encounter (Signed)
 Refill request sent to Dr Myra again and I notified him by text to send in.

## 2024-03-28 MED ORDER — TIZANIDINE HCL 4 MG PO TABS: 4.0000 mg | ORAL_TABLET | Freq: Two times a day (BID) | ORAL | 5 refills | Status: DC

## 2024-05-04 ENCOUNTER — Ambulatory Visit: Attending: Anesthesiology | Admitting: Anesthesiology

## 2024-05-04 DIAGNOSIS — M5387 Other specified dorsopathies, lumbosacral region: Secondary | ICD-10-CM

## 2024-05-04 DIAGNOSIS — M4716 Other spondylosis with myelopathy, lumbar region: Secondary | ICD-10-CM | POA: Diagnosis not present

## 2024-05-04 DIAGNOSIS — M47816 Spondylosis without myelopathy or radiculopathy, lumbar region: Secondary | ICD-10-CM | POA: Diagnosis not present

## 2024-05-04 DIAGNOSIS — Z79891 Long term (current) use of opiate analgesic: Secondary | ICD-10-CM

## 2024-05-04 DIAGNOSIS — M7918 Myalgia, other site: Secondary | ICD-10-CM

## 2024-05-04 DIAGNOSIS — M5136 Other intervertebral disc degeneration, lumbar region with discogenic back pain only: Secondary | ICD-10-CM | POA: Diagnosis not present

## 2024-05-04 DIAGNOSIS — G894 Chronic pain syndrome: Secondary | ICD-10-CM

## 2024-05-04 DIAGNOSIS — M792 Neuralgia and neuritis, unspecified: Secondary | ICD-10-CM

## 2024-05-05 ENCOUNTER — Encounter: Payer: Self-pay | Admitting: Anesthesiology

## 2024-05-05 ENCOUNTER — Telehealth: Payer: Self-pay

## 2024-05-05 MED ORDER — XTAMPZA ER 36 MG PO C12A: 2.0000 | EXTENDED_RELEASE_CAPSULE | Freq: Two times a day (BID) | ORAL | 0 refills | Status: AC

## 2024-05-05 MED ORDER — XTAMPZA ER 36 MG PO C12A: 2.0000 | EXTENDED_RELEASE_CAPSULE | Freq: Two times a day (BID) | ORAL | 0 refills | Status: DC

## 2024-05-05 NOTE — Telephone Encounter (Signed)
 Patient called stating that he didn't receive his phone call appt yesterday and will be out of medication tomorrow.  Sent to Dr Myra.

## 2024-05-05 NOTE — Progress Notes (Signed)
 Virtual Visit via Telephone Note  I connected with Peter Lamb on 05/05/24 at 11:20 AM EDT by telephone and verified that I am speaking with the correct person using two identifiers.  Location: Patient: Home Provider: Pain control center   I discussed the limitations, risks, security and privacy concerns of performing an evaluation and management service by telephone and the availability of in person appointments. I also discussed with the patient that there may be a patient responsible charge related to this service. The patient expressed understanding and agreed to proceed.   History of Present Illness: I spoke with Peter Lamb via telephone as we could not link for the video portion of the conference.  He reports that he recently returned from a long driving trip and is having a moderate exacerbation of his low back pain with some left leg sciatica.  This is generally his primary problem.  He has had epidural injections in the past that have been very successful for the sciatica component but does not feel that it would be warranted at this time.  No change in lower extremity strength function bowel or bladder function is noted.  The quality characteristic and distribution of the pain is also stable.  He takes his Xtampza  36 mg tablets 2 of these twice a day for the chronic low back pain and intermittent sciatica and this works well for him.  Otherwise he is in his usual state of health with no recent changes.  Review of systems: General: No fevers or chills Pulmonary: No shortness of breath or dyspnea Cardiac: No angina or palpitations or lightheadedness GI: No abdominal pain or constipation Psych: No depression    Observations/Objective:  Current Outpatient Medications:    [START ON 06/07/2024] oxyCODONE  ER (XTAMPZA  ER) 36 MG C12A, Take 2 capsules (72 mg total) by mouth 2 (two) times daily., Disp: 120 capsule, Rfl: 0   apixaban (ELIQUIS) 5 MG TABS tablet, TAKE ONE TABLET BY MOUTH  TWICE A DAY (CAUTION: BLOOD THINNER) (Patient not taking: Reported on 03/06/2024), Disp: , Rfl:    aspirin 81 MG tablet, Take 162 mg by mouth daily., Disp: , Rfl:    glipiZIDE (GLUCOTROL XL) 5 MG 24 hr tablet, Take 5 mg by mouth., Disp: , Rfl:    glucosamine-chondroitin 500-400 MG tablet, Take 1 tablet by mouth daily., Disp: , Rfl:    lisinopril (ZESTRIL) 10 MG tablet, Take 10 mg by mouth 2 (two) times daily., Disp: , Rfl:    Melatonin 10 MG TABS, Take by mouth at bedtime as needed., Disp: , Rfl:    metFORMIN (GLUCOPHAGE) 500 MG tablet, Take 1,000 mg by mouth 2 (two) times daily with a meal. Reported on 02/10/2016 (Patient not taking: Reported on 03/06/2024), Disp: , Rfl:    Multiple Vitamins-Minerals (VITAMINS TO GO MEN PO), Take 1 tablet by mouth daily. , Disp: , Rfl:    nitroGLYCERIN (NITROSTAT) 0.4 MG SL tablet, , Disp: , Rfl:    Omega-3 Fatty Acids (FISH OIL) 1000 MG CAPS, Take by mouth daily., Disp: , Rfl:    [START ON 05/08/2024] oxyCODONE  ER (XTAMPZA  ER) 36 MG C12A, Take 2 capsules (72 mg total) by mouth 2 (two) times daily., Disp: 120 capsule, Rfl: 0   rosuvastatin (CRESTOR) 10 MG tablet, Take 10 mg by mouth daily., Disp: , Rfl:    tadalafil (CIALIS) 20 MG tablet, Take 20 mg by mouth daily as needed., Disp: , Rfl:    tiZANidine  (ZANAFLEX ) 4 MG tablet, Take 1 tablet (4  mg total) by mouth 2 (two) times daily., Disp: 60 tablet, Rfl: 5   TraZODone HCl 150 MG TB24, Take by mouth at bedtime., Disp: , Rfl:    VITAMIN E PO, Take 90 Units by mouth daily., Disp: , Rfl:    Past Medical History:  Diagnosis Date   Arthritis    back, neck   Carpal boss of right wrist    CHF (congestive heart failure) (HCC)    Depression    Diabetes mellitus without complication (HCC)    Dysplastic nevus 10/08/2021   superior med buttocks - Moderate   GERD (gastroesophageal reflux disease)    H/O tooth extraction    Hyperlipidemia    Hypertension    Assessment and Plan: 1. Degeneration of intervertebral disc  of lumbar region with discogenic back pain   2. Lumbar spondylosis with myelopathy   3. Facet arthritis of lumbar region   4. Musculoskeletal pain   5. Chronic pain syndrome   6. Long term current use of opiate analgesic   7. Neurogenic pain   8. Sciatica of left side associated with disorder of lumbosacral spine    Based on our conversation today and after review of the [place]  practitioner database information I think is appropriate to refill his medicines for the next 2 months.  This will be dated for October 13 and November 12.  Will keep him on the current Xtampza  2 tablets of this twice a day which is working well for him.  He has no side effects.  He has been reliant on chronic opioid therapy secondary to the severity and complexity of his low back pain.  I have encouraged him to continue with stretching strengthening and should the pain persist we may schedule him for repeat epidural as these have worked very well for him in the past.  Continue follow-up with his primary care physicians for baseline medical care with return to clinic scheduled in 2 months.  Follow Up Instructions:    I discussed the assessment and treatment plan with the patient. The patient was provided an opportunity to ask questions and all were answered. The patient agreed with the plan and demonstrated an understanding of the instructions.   The patient was advised to call back or seek an in-person evaluation if the symptoms worsen or if the condition fails to improve as anticipated.  I provided 30 minutes of non-face-to-face time during this encounter.   Lynwood KANDICE Clause, MD

## 2024-06-28 ENCOUNTER — Encounter: Payer: Self-pay | Admitting: Anesthesiology

## 2024-06-28 ENCOUNTER — Ambulatory Visit: Attending: Anesthesiology | Admitting: Anesthesiology

## 2024-06-28 VITALS — BP 151/77 | HR 70 | Temp 98.0°F | Resp 16 | Ht 70.0 in | Wt 220.0 lb

## 2024-06-28 DIAGNOSIS — G894 Chronic pain syndrome: Secondary | ICD-10-CM

## 2024-06-28 DIAGNOSIS — M792 Neuralgia and neuritis, unspecified: Secondary | ICD-10-CM

## 2024-06-28 DIAGNOSIS — Z79891 Long term (current) use of opiate analgesic: Secondary | ICD-10-CM

## 2024-06-28 DIAGNOSIS — M5387 Other specified dorsopathies, lumbosacral region: Secondary | ICD-10-CM

## 2024-06-28 DIAGNOSIS — M47816 Spondylosis without myelopathy or radiculopathy, lumbar region: Secondary | ICD-10-CM

## 2024-06-28 DIAGNOSIS — M4716 Other spondylosis with myelopathy, lumbar region: Secondary | ICD-10-CM

## 2024-06-28 DIAGNOSIS — M5136 Other intervertebral disc degeneration, lumbar region with discogenic back pain only: Secondary | ICD-10-CM

## 2024-06-28 DIAGNOSIS — M7918 Myalgia, other site: Secondary | ICD-10-CM

## 2024-06-28 MED ORDER — XTAMPZA ER 36 MG PO C12A: 2.0000 | EXTENDED_RELEASE_CAPSULE | Freq: Two times a day (BID) | ORAL | 0 refills | Status: DC

## 2024-06-28 MED ORDER — XTAMPZA ER 36 MG PO C12A: 2.0000 | EXTENDED_RELEASE_CAPSULE | Freq: Two times a day (BID) | ORAL | 0 refills | Status: AC

## 2024-06-28 MED ORDER — TIZANIDINE HCL 4 MG PO TABS: 4.0000 mg | ORAL_TABLET | Freq: Two times a day (BID) | ORAL | 5 refills | Status: DC

## 2024-06-28 NOTE — Progress Notes (Signed)
 Nursing Pain Medication Assessment:  Safety precautions to be maintained throughout the outpatient stay will include: orient to surroundings, keep bed in low position, maintain call bell within reach at all times, provide assistance with transfer out of bed and ambulation.  Medication Inspection Compliance: Pill count conducted under aseptic conditions, in front of the patient. Neither the pills nor the bottle was removed from the patient's sight at any time. Once count was completed pills were immediately returned to the patient in their original bottle.  Medication: Oxycodone  ER (OxyContin ) Pill/Patch Count: 34 of 120 pills/patches remain Pill/Patch Appearance: Markings consistent with prescribed medication Bottle Appearance: Standard pharmacy container. Clearly labeled. Filled Date: 58 / 63 / 2025 Last Medication intake:  Today

## 2024-06-28 NOTE — Patient Instructions (Signed)
 ______________________________________________________________________    Preparing for your procedure  Appointments: If you think you may not be able to keep your appointment, call 24-48 hours in advance to cancel. We need time to make it available to others.  Procedure visits are for procedures only. During your procedure appointment there will be: NO Prescription Refills*. NO medication changes or discussions*. NO discussion of disability issues*. NO unrelated pain problem evaluations*. NO evaluations to order other pain procedures*. *These will be addressed at a separate and distinct evaluation encounter on the provider's evaluation schedule and not during procedure days.  Instructions: Food intake: Avoid eating anything solid for at least 8 hours prior to your procedure. Clear liquid intake: You may take clear liquids such as water up to 2 hours prior to your procedure. (No carbonated drinks. No soda.) Transportation: Unless otherwise stated by your physician, bring a driver. (Driver cannot be a Market researcher, Pharmacist, community, or any other form of public transportation.) Morning Medicines: Except for blood thinners, take all of your other morning medications with a sip of water. Make sure to take your heart and blood pressure medicines. If your blood pressure's lower number is above 100, the case will be rescheduled. Blood thinners: Make sure to stop your blood thinners as instructed.  If you take a blood thinner, but were not instructed to stop it, call our office [phone] and ask to talk to a nurse. Not stopping a blood thinner prior to certain procedures could lead to serious complications. Diabetics on insulin : Notify the staff so that you can be scheduled 1st case in the morning. If your diabetes requires high dose insulin , take only  of your normal insulin  dose the morning of the procedure and notify the staff that you have done so. Preventing infections: Shower with an antibacterial soap the  morning of your procedure.  Build-up your immune system: Take 1000 mg of Vitamin C with every meal (3 times a day) the day prior to your procedure. Antibiotics: Inform the nursing staff if you are taking any antibiotics or if you have any conditions that may require antibiotics prior to procedures. (Example: recent joint implants)   Pregnancy: If you are pregnant make sure to notify the nursing staff. Not doing so may result in injury to the fetus, including death.  Sickness: If you have a cold, fever, or any active infections, call and cancel or reschedule your procedure. Receiving steroids while having an infection may result in complications. Arrival: You must be in the facility at least 30 minutes prior to your scheduled procedure. Tardiness: Your scheduled time is also the cutoff time. If you do not arrive at least 15 minutes prior to your procedure, you will be rescheduled.  Children: Do not bring any children with you. Make arrangements to keep them home. Dress appropriately: There is always a possibility that your clothing may get soiled. Avoid long dresses. Valuables: Do not bring any jewelry or valuables.  Reasons to call and reschedule or cancel your procedure: (Following these recommendations will minimize the risk of a serious complication.) Surgeries: Avoid having procedures within 2 weeks of any surgery. (Avoid for 2 weeks before or after any surgery). Flu Shots: Avoid having procedures within 2 weeks of a flu shots or . (Avoid for 2 weeks before or after immunizations). Barium: Avoid having a procedure within 7-10 days after having had a radiological study involving the use of radiological contrast. (Myelograms, Barium swallow or enema study). Heart attacks: Avoid any elective procedures or surgeries for the  initial 6 months after a "Myocardial Infarction" (Heart Attack). Blood thinners: It is imperative that you stop these medications before procedures. Let us  know if you if you take  any blood thinner.  Infection: Avoid procedures during or within two weeks of an infection (including chest colds or gastrointestinal problems). Symptoms associated with infections include: Localized redness, fever, chills, night sweats or profuse sweating, burning sensation when voiding, cough, congestion, stuffiness, runny nose, sore throat, diarrhea, nausea, vomiting, cold or Flu symptoms, recent or current infections. It is specially important if the infection is over the area that we intend to treat. Heart and lung problems: Symptoms that may suggest an active cardiopulmonary problem include: cough, chest pain, breathing difficulties or shortness of breath, dizziness, ankle swelling, uncontrolled high or unusually low blood pressure, and/or palpitations. If you are experiencing any of these symptoms, cancel your procedure and contact your primary care physician for an evaluation.  Remember:  Regular Business hours are:  Monday to Thursday 8:00 AM to 4:00 PM  Provider's Schedule: Renaldo Caroli, MD:  Procedure days: Tuesday and Thursday 7:30 AM to 4:00 PM  Cephus Collin, MD:  Procedure days: Monday and Wednesday 7:30 AM to 4:00 PM Last  Updated: 07/06/2023 ______________________________________________________________________    Celiac Plexus Block Patient Information  Description: The celiac plexus is a group of nerves which are part of the sympathetic nervous system.  These nerves supply organs in the abdomen and pelvis.  Specific organs supplied with sensation by the celiac plexus include the stomach, liver, gallbladder, pancreas, kidneys and part of the gut.   The celiac plexus is located on both sides of the aorta at approximately the level of the first lumbar vertebral body.  The block will be performed with you lying on your abdomen with a pillow underneath.  Using direct x-ray guidance, the celiac plexus will be located on both sides of the spine.  Numbing medicine will be used to  deaden the skin prior to needle insertion.  In most cases, a small amount of sedation can be given by IV prior to the numbing medicine.  Two small needles will be place near the celiac plexus and local anesthetic and steroid will be injected.  The entire block usually last about 15-25 minutes.  Conditions which may be treated by celiac plexus block:  Acute and chronic pancreatitis Pain from liver or pancreatic cancer Pain from Crohn's disease Other types of abdominal or flank pain  Preparation for the injection:  Do not eat any solid food or dairy products within 8 hours of your appointment. You may drink clear liquids up to 3 hours before appointment.  Clear liquids include water, black coffee, juice or soda.  No milk or cream please. You may take your regular medication, including pain medications, with a sip of water before your appointment.  Diabetics should hold regular insulin  (if taken separately) and take 1/2 normal NPH dose in the morning of the procedure.  Carry some sugar containing items with you to your appointment. A driver must accompany you and be prepared to drive you home after your procedure. Bring all your current medications with you. An IV may be inserted and sedation may be given at the discretion of the physician. A blood pressure cuff, EKG, and other monitors will often be applied during the procedure.  Some patients may need to have extra oxygen administered for a short period. You will be asked to provide medical information, including your allergies and medications, prior to the procedure.  We must know immediately if you are taking blood thinners (like Coumadin/Warfarin) or if you are allergic to IV iodine  contrast (dye).  We must know if you could possible be pregnant.  Possible side-effects:  Bleeding from needle site or deeper Infection (rarre, can require surgery) Nerve injury (rare) Numbness & tingling (temporary) Collapsed lung (rare) Spinal headache ( a  headache worse with upright posture) Light-headedness (temporary) Pain at injection site (several days) Decreased blood pressure (temporary) Weakness in legs (temporary) Seizure or other drug reaction (rare)  Call if you experience:  Fever/chills associated with headache or increased back/neck pain Headache worsened by an upright position New onset weakness or numbness of an extremity below the injection site. Hives or difficulty breathing (go to the emergency room) Inflammation or drainage at the injection site. New onset diarrhea lasting more than 2 weeks. New symptoms which are concerning to you  Please note:  If effective, we will often do a series of 2-3 injections spaced 3-6 weeks apart to maximally decrease your pain.  If initial series is effective, you may be a candidate for a more permanent block of the celiac plexus. .  If you have questions, please call [phone] [facility]

## 2024-07-10 NOTE — Progress Notes (Signed)
 Subjective:  Patient ID: Peter Lamb, male    DOB: Jun 18, 1956  Age: 68 y.o. MRN: [id number]  CC: Back Pain   Procedure: None  HPI Peter Lamb presents for reevaluation and is doing well.  Peter Lamb continues to have complaints of low back pain with radiation into the right posterior lateral leg.  This responds favorably to epidural injection with the last epidural back in February of this year.  He has had recurrence of the same baseline pain over the course of the last 3 to 4 weeks of a crescendo nature basically radiating from the right low back down the posterior lateral leg into the right calf with cramping associated.  No new change in strength is noted.  Bowel bladder function been stable.  He is on chronic opioid therapy as well to keep his low back pain and lower extremity pain under control and this has worked well for him.  Unfortunately the medicines are less effective when the sciatica is exacerbated.  He has these bouts every 6 to 9 months and desires to follow-up with a epidural injection here within the next several weeks.  Otherwise he is in his usual state of health.  No side effects with the medications are noted.  Outpatient Medications Prior to Visit  Medication Sig Dispense Refill   apixaban (ELIQUIS) 5 MG TABS tablet TAKE ONE TABLET BY MOUTH TWICE A DAY (CAUTION: BLOOD THINNER) (Patient not taking: Reported on 03/06/2024)     aspirin 81 MG tablet Take 162 mg by mouth daily.     glipiZIDE (GLUCOTROL XL) 5 MG 24 hr tablet Take 5 mg by mouth.     glucosamine-chondroitin 500-400 MG tablet Take 1 tablet by mouth daily.     lisinopril (ZESTRIL) 10 MG tablet Take 10 mg by mouth 2 (two) times daily.     Melatonin 10 MG TABS Take by mouth at bedtime as needed.     metFORMIN (GLUCOPHAGE) 500 MG tablet Take 1,000 mg by mouth 2 (two) times daily with a meal. Reported on 02/10/2016 (Patient not taking: Reported on 03/06/2024)     Multiple Vitamins-Minerals (VITAMINS TO GO MEN PO) Take  1 tablet by mouth daily.      nitroGLYCERIN (NITROSTAT) 0.4 MG SL tablet      Omega-3 Fatty Acids (FISH OIL) 1000 MG CAPS Take by mouth daily.     rosuvastatin (CRESTOR) 10 MG tablet Take 10 mg by mouth daily.     tadalafil (CIALIS) 20 MG tablet Take 20 mg by mouth daily as needed.     TraZODone HCl 150 MG TB24 Take by mouth at bedtime.     VITAMIN E PO Take 90 Units by mouth daily.     oxyCODONE  ER (XTAMPZA  ER) 36 MG C12A Take 2 capsules (72 mg total) by mouth 2 (two) times daily. 120 capsule 0   tiZANidine  (ZANAFLEX ) 4 MG tablet Take 1 tablet (4 mg total) by mouth 2 (two) times daily. 60 tablet 5   No facility-administered medications prior to visit.    Review of Systems CNS: No confusion or sedation Cardiac: No angina or palpitations GI: No abdominal pain or constipation Constitutional: No nausea vomiting fevers or chills  Objective:  BP (!) 151/77   Pulse 70   Temp 98 F (36.7 C)   Resp 16   Ht 5' 10 (1.778 m)   Wt 220 lb (99.8 kg)   SpO2 97%   BMI 31.57 kg/m    BP Readings from Last  3 Encounters:  06/28/24 (!) 151/77  03/06/24 (!) 160/80  02/07/24 (!) 173/90     Wt Readings from Last 3 Encounters:  06/28/24 220 lb (99.8 kg)  03/06/24 214 lb (97.1 kg)  02/07/24 225 lb (102.1 kg)     Physical Exam Pt is alert and oriented PERRL EOMI HEART IS RRR no murmur or rub LCTA no wheezing or rales MUSCULOSKELETAL reveals some paraspinous muscle tenderness with some tenderness in the right posterior lateral low back but no overt trigger point noted.  He has a positive straight leg raise and does have an antalgic gait with a positive straight leg raise on the right side.  Muscle tone and bulk is at baseline.  Labs  Lab Results  Component Value Date   HGBA1C 9.0 03/04/2010   Lab Results  Component Value Date   CREATININE 1.02 02/06/2024    -------------------------------------------------------------------------------------------------------------------- Lab  Results  Component Value Date   WBC 6.2 02/06/2024   HGB 14.3 02/06/2024   HCT 40.1 02/06/2024   PLT 145 (L) 02/06/2024   GLUCOSE 173 (H) 02/06/2024   ALT 31 03/04/2010   AST 23 03/04/2010   NA 137 02/06/2024   K 4.0 02/06/2024   CL 100 02/06/2024   CREATININE 1.02 02/06/2024   BUN 15 02/06/2024   CO2 26 02/06/2024   TSH 0.995 02/06/2024   PSA 0.26 03/04/2010   HGBA1C 9.0 03/04/2010    --------------------------------------------------------------------------------------------------------------------- DG Chest 2 View Result Date: 02/06/2024 CLINICAL DATA:  Chest pain EXAM: CHEST - 2 VIEW COMPARISON:  01/09/2023 FINDINGS: Cardiac shadow is stable. Aortic calcifications are seen. The lungs are well aerated bilaterally. No focal infiltrate or effusion is seen. No bony abnormality is noted. IMPRESSION: No active cardiopulmonary disease. Electronically Signed   By: Peter Lamb M.D.   On: 02/06/2024 02:52     Assessment & Plan:   Peter Lamb was seen today for back pain.  Diagnoses and all orders for this visit:  Degeneration of intervertebral disc of lumbar region with discogenic back pain  Lumbar spondylosis with myelopathy -     Lumbar Epidural Injection; Future  Facet arthritis of lumbar region  Musculoskeletal pain  Chronic pain syndrome  Long term current use of opiate analgesic  Neurogenic pain  Sciatica of left side associated with disorder of lumbosacral spine -     Lumbar Epidural Injection; Future  Spondylosis of lumbar region without myelopathy or radiculopathy -     Lumbar Epidural Injection; Future  Other orders -     oxyCODONE  ER (XTAMPZA  ER) 36 MG C12A; Take 2 capsules (72 mg total) by mouth 2 (two) times daily. -     oxyCODONE  ER (XTAMPZA  ER) 36 MG C12A; Take 2 capsules (72 mg total) by mouth 2 (two) times daily. -     tiZANidine  (ZANAFLEX ) 4 MG tablet; Take 1 tablet (4 mg total) by mouth 2 (two) times  daily.        ----------------------------------------------------------------------------------------------------------------------  Problem List Items Addressed This Visit       Unprioritized   Chronic pain (Chronic)   Relevant Medications   oxyCODONE  ER (XTAMPZA  ER) 36 MG C12A   oxyCODONE  ER (XTAMPZA  ER) 36 MG C12A (Start on 08/06/2024)   tiZANidine  (ZANAFLEX ) 4 MG tablet   Long term current use of opiate analgesic (Chronic)   Musculoskeletal pain (Chronic)   Neurogenic pain (Chronic)   DDD (degenerative disc disease), lumbar - Primary   Sciatica of left side associated with disorder of lumbosacral spine   Relevant  Medications   tiZANidine  (ZANAFLEX ) 4 MG tablet   Other Relevant Orders   Lumbar Epidural Injection   Spondylosis of lumbar region without myelopathy or radiculopathy   Relevant Orders   Lumbar Epidural Injection   Other Visit Diagnoses       Lumbar spondylosis with myelopathy       Relevant Orders   Lumbar Epidural Injection     Facet arthritis of lumbar region       Relevant Medications   oxyCODONE  ER (XTAMPZA  ER) 36 MG C12A   oxyCODONE  ER (XTAMPZA  ER) 36 MG C12A (Start on 08/06/2024)   tiZANidine  (ZANAFLEX ) 4 MG tablet         ----------------------------------------------------------------------------------------------------------------------  1. Degeneration of intervertebral disc of lumbar region with discogenic back pain (Primary) Continue core stretching strengthening exercises.  2. Lumbar spondylosis with myelopathy I am schedule him for repeat epidural as he has had good response to this therapy.  He generally has about 1 or 2 of these every year for the past several years.  Continue current medication management as well.  We have gone over the risks and benefits of epidural steroid injections and he has no question about this. - Lumbar Epidural Injection; Future  3. Facet arthritis of lumbar region Core stretching strengthening  exercises.  4. Musculoskeletal pain As above  5. Chronic pain syndrome I have reviewed the [place]  practitioner database information is appropriate for refill for his opiate therapy dated for December 12 and January 11.  He understands the risks and benefits of chronic opioid therapy as well and has done well with therapy.  No changes are initiated today.  6. Long term current use of opiate analgesic   7. Neurogenic pain   8. Sciatica of left side associated with disorder of lumbosacral spine As above - Lumbar Epidural Injection; Future  9. Spondylosis of lumbar region without myelopathy or radiculopathy  - Lumbar Epidural Injection; Future    ----------------------------------------------------------------------------------------------------------------------  I am having Peter Lamb start on Xtampza  ER. I am also having him maintain his aspirin, metFORMIN, Multiple Vitamins-Minerals (VITAMINS TO GO MEN PO), rosuvastatin, TraZODone HCl, nitroGLYCERIN, Fish Oil, VITAMIN E PO, Melatonin, glucosamine-chondroitin, tadalafil, apixaban, lisinopril, glipiZIDE, Xtampza  ER, and tiZANidine .   Meds ordered this encounter  Medications   oxyCODONE  ER (XTAMPZA  ER) 36 MG C12A    Sig: Take 2 capsules (72 mg total) by mouth 2 (two) times daily.    Dispense:  120 capsule    Refill:  0   oxyCODONE  ER (XTAMPZA  ER) 36 MG C12A    Sig: Take 2 capsules (72 mg total) by mouth 2 (two) times daily.    Dispense:  120 capsule    Refill:  0   tiZANidine  (ZANAFLEX ) 4 MG tablet    Sig: Take 1 tablet (4 mg total) by mouth 2 (two) times daily.    Dispense:  60 tablet    Refill:  5    NEW RX   Patient's Medications  New Prescriptions   OXYCODONE  ER (XTAMPZA  ER) 36 MG C12A    Take 2 capsules (72 mg total) by mouth 2 (two) times daily.  Previous Medications   APIXABAN (ELIQUIS) 5 MG TABS TABLET    TAKE ONE TABLET BY MOUTH TWICE A DAY (CAUTION: BLOOD THINNER)   ASPIRIN 81 MG TABLET    Take  162 mg by mouth daily.   GLIPIZIDE (GLUCOTROL XL) 5 MG 24 HR TABLET    Take 5 mg by mouth.   GLUCOSAMINE-CHONDROITIN 500-400  MG TABLET    Take 1 tablet by mouth daily.   LISINOPRIL (ZESTRIL) 10 MG TABLET    Take 10 mg by mouth 2 (two) times daily.   MELATONIN 10 MG TABS    Take by mouth at bedtime as needed.   METFORMIN (GLUCOPHAGE) 500 MG TABLET    Take 1,000 mg by mouth 2 (two) times daily with a meal. Reported on 02/10/2016   MULTIPLE VITAMINS-MINERALS (VITAMINS TO GO MEN PO)    Take 1 tablet by mouth daily.    NITROGLYCERIN (NITROSTAT) 0.4 MG SL TABLET       OMEGA-3 FATTY ACIDS (FISH OIL) 1000 MG CAPS    Take by mouth daily.   ROSUVASTATIN (CRESTOR) 10 MG TABLET    Take 10 mg by mouth daily.   TADALAFIL (CIALIS) 20 MG TABLET    Take 20 mg by mouth daily as needed.   TRAZODONE HCL 150 MG TB24    Take by mouth at bedtime.   VITAMIN E PO    Take 90 Units by mouth daily.  Modified Medications   Modified Medication Previous Medication   OXYCODONE  ER (XTAMPZA  ER) 36 MG C12A oxyCODONE  ER (XTAMPZA  ER) 36 MG C12A      Take 2 capsules (72 mg total) by mouth 2 (two) times daily.    Take 2 capsules (72 mg total) by mouth 2 (two) times daily.   TIZANIDINE  (ZANAFLEX ) 4 MG TABLET tiZANidine  (ZANAFLEX ) 4 MG tablet      Take 1 tablet (4 mg total) by mouth 2 (two) times daily.    Take 1 tablet (4 mg total) by mouth 2 (two) times daily.  Discontinued Medications   No medications on file   ----------------------------------------------------------------------------------------------------------------------  Follow-up: Return in about 2 months (around 08/29/2024) for evaluation, procedure.  Continue follow-up with his primary care physicians for baseline medical care with scheduled return to clinic for epidural here in the next several weeks.  Lynwood KANDICE Clause, MD

## 2024-08-15 ENCOUNTER — Encounter: Payer: Self-pay | Admitting: Anesthesiology

## 2024-08-15 ENCOUNTER — Ambulatory Visit: Attending: Anesthesiology | Admitting: Anesthesiology

## 2024-08-15 DIAGNOSIS — M792 Neuralgia and neuritis, unspecified: Secondary | ICD-10-CM | POA: Diagnosis not present

## 2024-08-15 DIAGNOSIS — G894 Chronic pain syndrome: Secondary | ICD-10-CM | POA: Diagnosis not present

## 2024-08-15 DIAGNOSIS — M5387 Other specified dorsopathies, lumbosacral region: Secondary | ICD-10-CM | POA: Diagnosis not present

## 2024-08-15 DIAGNOSIS — M4716 Other spondylosis with myelopathy, lumbar region: Secondary | ICD-10-CM

## 2024-08-15 DIAGNOSIS — M5136 Other intervertebral disc degeneration, lumbar region with discogenic back pain only: Secondary | ICD-10-CM | POA: Diagnosis not present

## 2024-08-15 DIAGNOSIS — M7918 Myalgia, other site: Secondary | ICD-10-CM | POA: Diagnosis not present

## 2024-08-15 DIAGNOSIS — M47816 Spondylosis without myelopathy or radiculopathy, lumbar region: Secondary | ICD-10-CM

## 2024-08-15 MED ORDER — TIZANIDINE HCL 4 MG PO TABS: 4.0000 mg | ORAL_TABLET | Freq: Two times a day (BID) | ORAL | 5 refills | Status: AC

## 2024-08-15 MED ORDER — XTAMPZA ER 36 MG PO C12A: 2.0000 | EXTENDED_RELEASE_CAPSULE | Freq: Two times a day (BID) | ORAL | 0 refills | Status: AC

## 2024-08-15 NOTE — Progress Notes (Signed)
 Virtual Visit via Telephone Note  I connected with Peter Lamb on 08/15/24 at  1:00 PM EST by telephone and verified that I am speaking with the correct person using two identifiers.  Location: Patient: Home Provider: [facility]   I discussed the limitations, risks, security and privacy concerns of performing an evaluation and management service by telephone and the availability of in person appointments. I also discussed with the patient that there may be a patient responsible charge related to this service. The patient expressed understanding and agreed to proceed.   History of Present Illness: I spoke with Peter Lamb via telephone as we could not like for the video portion of the conference.  He reports that he has been doing well with his low back pain.'s been staying under good control with no recent significant resurgence of his sciatica.  He still has baseline low back pain comparable to what he has had for several years.  He continues to take his Xtampza  2 tablets twice a day and this keeps his pain under good control.  He gets about 70 to 80% relief lasting about 8 to 12 hours when he takes his medication.  Medication enables him to stay active productive and functional and sleep better at night.  Without the medication he maintains that he is unable to do this.  No side effects with the medicine reported.  No change in lower extremity strength function bowel or bladder function is noted.  The quality characteristic and distribution of the pain remain stable with no recent change.  Review of systems: General: No fevers or chills Pulmonary: No shortness of breath or dyspnea Cardiac: No angina or palpitations or lightheadedness GI: No abdominal pain or constipation Psych: No depression    Observations/Objective: Current Medications[1]  Past Medical History:  Diagnosis Date   Arthritis    back, neck   Carpal boss of right wrist    CHF (congestive heart failure)  (HCC)    Depression    Diabetes mellitus without complication (HCC)    Dysplastic nevus 10/08/2021   superior med buttocks - Moderate   GERD (gastroesophageal reflux disease)    H/O tooth extraction    Hyperlipidemia    Hypertension     Assessment and Plan: 1. Degeneration of intervertebral disc of lumbar region with discogenic back pain   2. Lumbar spondylosis with myelopathy   3. Facet arthritis of lumbar region   4. Musculoskeletal pain   5. Chronic pain syndrome   6. Neurogenic pain   7. Sciatica of left side associated with disorder of lumbosacral spine   8. Spondylosis of lumbar region without myelopathy or radiculopathy    Based on our conversation today I think is appropriate to refill his medicines.  I have reviewed the [facility] [place]  practitioner database information is appropriate.  Refills are generated for February 10 and March 12.  I have also refilled his tizanidine  for 2 tablets/day for muscle relaxation and spasm.  Continue core stretching strengthening exercises.  No other changes are initiated.  Continue follow-up with his primary care physician for baseline medical care.  Follow Up Instructions:    I discussed the assessment and treatment plan with the patient. The patient was provided an opportunity to ask questions and all were answered. The patient agreed with the plan and demonstrated an understanding of the instructions.   The patient was advised to call back or seek an in-person evaluation if the symptoms worsen or if the condition fails to  improve as anticipated.  I provided 30 minutes of non-face-to-face time during this encounter.   Lynwood KANDICE Clause, MD     [1]  Current Outpatient Medications:    [START ON 10/05/2024] oxyCODONE  ER (XTAMPZA  ER) 36 MG C12A, Take 2 capsules (72 mg total) by mouth 2 (two) times daily., Disp: 120 capsule, Rfl: 0   apixaban (ELIQUIS) 5 MG TABS tablet, TAKE ONE TABLET BY MOUTH TWICE A DAY (CAUTION: BLOOD THINNER) (Patient not  taking: Reported on 03/06/2024), Disp: , Rfl:    aspirin 81 MG tablet, Take 162 mg by mouth daily., Disp: , Rfl:    glipiZIDE (GLUCOTROL XL) 5 MG 24 hr tablet, Take 5 mg by mouth., Disp: , Rfl:    glucosamine-chondroitin 500-400 MG tablet, Take 1 tablet by mouth daily., Disp: , Rfl:    lisinopril (ZESTRIL) 10 MG tablet, Take 10 mg by mouth 2 (two) times daily., Disp: , Rfl:    Melatonin 10 MG TABS, Take by mouth at bedtime as needed., Disp: , Rfl:    metFORMIN (GLUCOPHAGE) 500 MG tablet, Take 1,000 mg by mouth 2 (two) times daily with a meal. Reported on 02/10/2016 (Patient not taking: Reported on 03/06/2024), Disp: , Rfl:    Multiple Vitamins-Minerals (VITAMINS TO GO MEN PO), Take 1 tablet by mouth daily. , Disp: , Rfl:    nitroGLYCERIN (NITROSTAT) 0.4 MG SL tablet, , Disp: , Rfl:    Omega-3 Fatty Acids (FISH OIL) 1000 MG CAPS, Take by mouth daily., Disp: , Rfl:    [START ON 09/05/2024] oxyCODONE  ER (XTAMPZA  ER) 36 MG C12A, Take 2 capsules (72 mg total) by mouth 2 (two) times daily., Disp: 120 capsule, Rfl: 0   rosuvastatin (CRESTOR) 10 MG tablet, Take 10 mg by mouth daily., Disp: , Rfl:    tadalafil (CIALIS) 20 MG tablet, Take 20 mg by mouth daily as needed., Disp: , Rfl:    tiZANidine  (ZANAFLEX ) 4 MG tablet, Take 1 tablet (4 mg total) by mouth 2 (two) times daily., Disp: 60 tablet, Rfl: 5   TraZODone HCl 150 MG TB24, Take by mouth at bedtime., Disp: , Rfl:    VITAMIN E PO, Take 90 Units by mouth daily., Disp: , Rfl:
